# Patient Record
Sex: Female | Born: 1970 | Race: White | Hispanic: No | Smoking: Former smoker
Health system: Southern US, Community
[De-identification: ages and names within clinical notes are randomized; demographics above are authoritative.]

## PROBLEM LIST (undated history)

## (undated) ENCOUNTER — Inpatient Hospital Stay (HOSPITAL_COMMUNITY): Payer: Self-pay

## (undated) DIAGNOSIS — E538 Deficiency of other specified B group vitamins: Secondary | ICD-10-CM

## (undated) DIAGNOSIS — E559 Vitamin D deficiency, unspecified: Secondary | ICD-10-CM

## (undated) DIAGNOSIS — M722 Plantar fascial fibromatosis: Secondary | ICD-10-CM

## (undated) DIAGNOSIS — Z8719 Personal history of other diseases of the digestive system: Secondary | ICD-10-CM

## (undated) DIAGNOSIS — R1310 Dysphagia, unspecified: Secondary | ICD-10-CM

## (undated) DIAGNOSIS — E739 Lactose intolerance, unspecified: Secondary | ICD-10-CM

## (undated) DIAGNOSIS — R32 Unspecified urinary incontinence: Secondary | ICD-10-CM

## (undated) DIAGNOSIS — IMO0002 Reserved for concepts with insufficient information to code with codable children: Secondary | ICD-10-CM

## (undated) DIAGNOSIS — C73 Malignant neoplasm of thyroid gland: Secondary | ICD-10-CM

## (undated) DIAGNOSIS — K259 Gastric ulcer, unspecified as acute or chronic, without hemorrhage or perforation: Secondary | ICD-10-CM

## (undated) DIAGNOSIS — F1010 Alcohol abuse, uncomplicated: Secondary | ICD-10-CM

## (undated) DIAGNOSIS — T7840XA Allergy, unspecified, initial encounter: Secondary | ICD-10-CM

## (undated) DIAGNOSIS — Z86018 Personal history of other benign neoplasm: Secondary | ICD-10-CM

## (undated) DIAGNOSIS — F32A Depression, unspecified: Secondary | ICD-10-CM

## (undated) DIAGNOSIS — E669 Obesity, unspecified: Secondary | ICD-10-CM

## (undated) DIAGNOSIS — F509 Eating disorder, unspecified: Secondary | ICD-10-CM

## (undated) DIAGNOSIS — Z91018 Allergy to other foods: Secondary | ICD-10-CM

## (undated) DIAGNOSIS — E039 Hypothyroidism, unspecified: Secondary | ICD-10-CM

## (undated) DIAGNOSIS — K589 Irritable bowel syndrome without diarrhea: Secondary | ICD-10-CM

## (undated) DIAGNOSIS — M2550 Pain in unspecified joint: Secondary | ICD-10-CM

## (undated) DIAGNOSIS — R7303 Prediabetes: Secondary | ICD-10-CM

## (undated) DIAGNOSIS — L719 Rosacea, unspecified: Secondary | ICD-10-CM

## (undated) DIAGNOSIS — G4730 Sleep apnea, unspecified: Secondary | ICD-10-CM

## (undated) DIAGNOSIS — Z85850 Personal history of malignant neoplasm of thyroid: Secondary | ICD-10-CM

## (undated) DIAGNOSIS — Z87442 Personal history of urinary calculi: Secondary | ICD-10-CM

## (undated) DIAGNOSIS — K921 Melena: Secondary | ICD-10-CM

## (undated) DIAGNOSIS — B019 Varicella without complication: Secondary | ICD-10-CM

## (undated) DIAGNOSIS — D649 Anemia, unspecified: Secondary | ICD-10-CM

## (undated) DIAGNOSIS — M7989 Other specified soft tissue disorders: Secondary | ICD-10-CM

## (undated) DIAGNOSIS — K5090 Crohn's disease, unspecified, without complications: Secondary | ICD-10-CM

## (undated) DIAGNOSIS — K5900 Constipation, unspecified: Secondary | ICD-10-CM

## (undated) DIAGNOSIS — K219 Gastro-esophageal reflux disease without esophagitis: Secondary | ICD-10-CM

## (undated) DIAGNOSIS — Z3490 Encounter for supervision of normal pregnancy, unspecified, unspecified trimester: Secondary | ICD-10-CM

## (undated) DIAGNOSIS — J45909 Unspecified asthma, uncomplicated: Secondary | ICD-10-CM

## (undated) DIAGNOSIS — N816 Rectocele: Secondary | ICD-10-CM

## (undated) DIAGNOSIS — N200 Calculus of kidney: Secondary | ICD-10-CM

## (undated) DIAGNOSIS — D509 Iron deficiency anemia, unspecified: Secondary | ICD-10-CM

## (undated) DIAGNOSIS — R011 Cardiac murmur, unspecified: Secondary | ICD-10-CM

## (undated) DIAGNOSIS — E0789 Other specified disorders of thyroid: Secondary | ICD-10-CM

## (undated) HISTORY — DX: Iron deficiency anemia, unspecified: D50.9

## (undated) HISTORY — DX: Crohn's disease, unspecified, without complications: K50.90

## (undated) HISTORY — DX: Dysphagia, unspecified: R13.10

## (undated) HISTORY — DX: Reserved for concepts with insufficient information to code with codable children: IMO0002

## (undated) HISTORY — PX: OTHER SURGICAL HISTORY: SHX169

## (undated) HISTORY — DX: Other specified disorders of thyroid: E07.89

## (undated) HISTORY — DX: Obesity, unspecified: E66.9

## (undated) HISTORY — DX: Sleep apnea, unspecified: G47.30

## (undated) HISTORY — DX: Unspecified asthma, uncomplicated: J45.909

## (undated) HISTORY — DX: Varicella without complication: B01.9

## (undated) HISTORY — DX: Melena: K92.1

## (undated) HISTORY — DX: Rosacea, unspecified: L71.9

## (undated) HISTORY — DX: Plantar fascial fibromatosis: M72.2

## (undated) HISTORY — DX: Lactose intolerance, unspecified: E73.9

## (undated) HISTORY — PX: HERNIA REPAIR: SHX51

## (undated) HISTORY — DX: Allergy to other foods: Z91.018

## (undated) HISTORY — DX: Malignant neoplasm of thyroid gland: C73

## (undated) HISTORY — DX: Other specified soft tissue disorders: M79.89

## (undated) HISTORY — DX: Vitamin D deficiency, unspecified: E55.9

## (undated) HISTORY — DX: Allergy, unspecified, initial encounter: T78.40XA

## (undated) HISTORY — DX: Unspecified urinary incontinence: R32

## (undated) HISTORY — DX: Constipation, unspecified: K59.00

## (undated) HISTORY — DX: Depression, unspecified: F32.A

## (undated) HISTORY — DX: Eating disorder, unspecified: F50.9

## (undated) HISTORY — DX: Irritable bowel syndrome, unspecified: K58.9

## (undated) HISTORY — DX: Anemia, unspecified: D64.9

## (undated) HISTORY — DX: Cardiac murmur, unspecified: R01.1

## (undated) HISTORY — DX: Gastro-esophageal reflux disease without esophagitis: K21.9

## (undated) HISTORY — DX: Alcohol abuse, uncomplicated: F10.10

## (undated) HISTORY — DX: Personal history of other benign neoplasm: Z86.018

## (undated) HISTORY — DX: Pain in unspecified joint: M25.50

## (undated) HISTORY — DX: Gastric ulcer, unspecified as acute or chronic, without hemorrhage or perforation: K25.9

## (undated) HISTORY — DX: Deficiency of other specified B group vitamins: E53.8

## (undated) HISTORY — DX: Rectocele: N81.6

## (undated) HISTORY — DX: Calculus of kidney: N20.0

## (undated) HISTORY — DX: Personal history of malignant neoplasm of thyroid: Z85.850

---

## 2005-05-10 DIAGNOSIS — C73 Malignant neoplasm of thyroid gland: Secondary | ICD-10-CM

## 2005-05-10 HISTORY — DX: Malignant neoplasm of thyroid gland: C73

## 2005-05-10 HISTORY — PX: THYROIDECTOMY: SHX17

## 2008-03-26 DIAGNOSIS — D239 Other benign neoplasm of skin, unspecified: Secondary | ICD-10-CM

## 2008-03-26 HISTORY — DX: Other benign neoplasm of skin, unspecified: D23.9

## 2009-02-19 ENCOUNTER — Ambulatory Visit: Payer: Self-pay | Admitting: Gastroenterology

## 2009-02-19 HISTORY — PX: ESOPHAGOGASTRODUODENOSCOPY: SHX1529

## 2010-02-03 DIAGNOSIS — L818 Other specified disorders of pigmentation: Secondary | ICD-10-CM

## 2010-02-03 HISTORY — DX: Other specified disorders of pigmentation: L81.8

## 2011-05-11 LAB — HM PAP SMEAR

## 2011-10-07 ENCOUNTER — Ambulatory Visit (INDEPENDENT_AMBULATORY_CARE_PROVIDER_SITE_OTHER): Payer: Medicaid Other | Admitting: Family Medicine

## 2011-10-07 ENCOUNTER — Encounter: Payer: Self-pay | Admitting: Family Medicine

## 2011-10-07 VITALS — BP 122/68 | HR 74 | Temp 98.0°F | Ht 64.0 in | Wt 184.0 lb

## 2011-10-07 DIAGNOSIS — E039 Hypothyroidism, unspecified: Secondary | ICD-10-CM

## 2011-10-07 DIAGNOSIS — L989 Disorder of the skin and subcutaneous tissue, unspecified: Secondary | ICD-10-CM

## 2011-10-07 DIAGNOSIS — M549 Dorsalgia, unspecified: Secondary | ICD-10-CM

## 2011-10-07 DIAGNOSIS — IMO0002 Reserved for concepts with insufficient information to code with codable children: Secondary | ICD-10-CM

## 2011-10-07 DIAGNOSIS — J4520 Mild intermittent asthma, uncomplicated: Secondary | ICD-10-CM

## 2011-10-07 DIAGNOSIS — J45909 Unspecified asthma, uncomplicated: Secondary | ICD-10-CM

## 2011-10-07 MED ORDER — ALBUTEROL SULFATE HFA 108 (90 BASE) MCG/ACT IN AERS: 2.0000 | INHALATION_SPRAY | RESPIRATORY_TRACT | Status: DC | PRN

## 2011-10-07 MED ORDER — FLUTICASONE-SALMETEROL 250-50 MCG/DOSE IN AEPB: 1.0000 | INHALATION_SPRAY | Freq: Two times a day (BID) | RESPIRATORY_TRACT | Status: DC

## 2011-10-07 NOTE — Patient Instructions (Signed)
It was great to see you today! We will get someone in touch with you about seeing a geneticist.

## 2011-10-11 ENCOUNTER — Telehealth: Payer: Self-pay | Admitting: *Deleted

## 2011-10-11 NOTE — Telephone Encounter (Signed)
[facility] calling because patient had a visit on 09/09/11 for acne.  [facility] listed as PCP on Medicaid card and insurance declined visit. Due to patient having Medicaid, office calling to request NPI #  to authorize appt.  NPI # given.  Gaylene Brooks, RN

## 2011-10-21 ENCOUNTER — Encounter: Payer: Self-pay | Admitting: Family Medicine

## 2011-10-21 DIAGNOSIS — E039 Hypothyroidism, unspecified: Secondary | ICD-10-CM | POA: Insufficient documentation

## 2011-10-21 DIAGNOSIS — J4520 Mild intermittent asthma, uncomplicated: Secondary | ICD-10-CM | POA: Insufficient documentation

## 2011-10-21 NOTE — Assessment & Plan Note (Signed)
Managed by functional medicine doc.  Will give referral for this.

## 2011-10-21 NOTE — Assessment & Plan Note (Signed)
Will give rx for advair for use during summer.  Are using the med as she has been on it before to good effect.

## 2011-10-21 NOTE — Progress Notes (Signed)
Patient ID: Linda Ware, female   DOB: [birth year], 41 y.o.   MRN: [id number] Subjective: The patient is a 41 y.o. year old female who presents today for new patient appointment.  1. Thyroid: Patient developed papillary carcinoma of the thyroid in 2007.  Had surgery.  Is being managed by functional medicine physician on armor thyroid.  No current hyper/hypo symptoms.  Weight is down somewhat over the last month or two.  Otherwise doing well.  No evidence of recurrence of cancer.  2. Asthma: Uses albuterol PRN.  Has been on advair in the past.  Currently using albuterol 4-5 times daily.  Does have problems with cough and wheezing.  No night time awakenings.  No hospitalizations.  Not on any long-term meds or ICS currently.  3. Genetics: Has Askinazi jewish heritage.  Has multiple questions about genetic testing related to this.  No history of failed genetic screen in children.  4. Back pain: Intermittent.  Sees chyropacter for this  Patient's past medical, social, and family history were reviewed and updated as appropriate. History  Substance Use Topics  . Smoking status: Never Smoker   . Smokeless tobacco: Not on file  . Alcohol Use: Not on file   Objective:  Filed Vitals:   10/07/11 1550  BP: 122/68  Pulse: 74  Temp: 98 F (36.7 C)   Gen: NAD, overweight HEENT: MMM, EOMI, PERRL, TM normal bilaterally, no adenopathy CV: RRR, faint systolic murmur Resp: Occasional wheezes. Back: No pain on palpation, no deformities Ext: 2+ pulses, no edema  Assessment/Plan:  Please also see individual problems in problem list for problem-specific plans.

## 2011-12-07 ENCOUNTER — Encounter: Payer: Self-pay | Admitting: Family Medicine

## 2011-12-07 ENCOUNTER — Ambulatory Visit (INDEPENDENT_AMBULATORY_CARE_PROVIDER_SITE_OTHER): Payer: Medicaid Other | Admitting: Family Medicine

## 2011-12-07 VITALS — BP 108/54 | HR 56 | Temp 97.3°F | Ht 64.0 in | Wt 187.1 lb

## 2011-12-07 DIAGNOSIS — M75101 Unspecified rotator cuff tear or rupture of right shoulder, not specified as traumatic: Secondary | ICD-10-CM

## 2011-12-07 DIAGNOSIS — M722 Plantar fascial fibromatosis: Secondary | ICD-10-CM

## 2011-12-07 DIAGNOSIS — M719 Bursopathy, unspecified: Secondary | ICD-10-CM

## 2011-12-07 DIAGNOSIS — M67919 Unspecified disorder of synovium and tendon, unspecified shoulder: Secondary | ICD-10-CM

## 2011-12-07 NOTE — Patient Instructions (Signed)
I want you to make an appointment with Dr. Darrick Penna at our sports medicine clinic on your way out. Until you see him, I want you to continue using Motrin three times per day, doing the stretch I showed you, and bathing your foot in ice for 15 min 2-3 times per day. Make sure to bring your running shoes and any other shoes you wear on a regular basis in with you to your appointment there.

## 2011-12-08 ENCOUNTER — Encounter: Payer: Self-pay | Admitting: Sports Medicine

## 2011-12-08 ENCOUNTER — Ambulatory Visit (INDEPENDENT_AMBULATORY_CARE_PROVIDER_SITE_OTHER): Payer: Medicaid Other | Admitting: Sports Medicine

## 2011-12-08 VITALS — BP 120/54 | Ht 64.0 in | Wt 185.0 lb

## 2011-12-08 DIAGNOSIS — M722 Plantar fascial fibromatosis: Secondary | ICD-10-CM

## 2011-12-08 DIAGNOSIS — IMO0002 Reserved for concepts with insufficient information to code with codable children: Secondary | ICD-10-CM

## 2011-12-08 DIAGNOSIS — S46919A Strain of unspecified muscle, fascia and tendon at shoulder and upper arm level, unspecified arm, initial encounter: Secondary | ICD-10-CM

## 2011-12-08 NOTE — Patient Instructions (Addendum)
Try out your new inserts Do your plantar fascial stretches and step exercises Submerge your heel in ice water for 10 minutes every day Be sure to do your shoulder exercises Followup in 4 weeks. We may make you a new pair of custom orthotics at that time

## 2011-12-08 NOTE — Progress Notes (Signed)
  Subjective:    Patient ID: Linda Ware, female    DOB: 10-23-1970, 41 y.o.   MRN: [id number]  HPI Pt comes today for evaluation of left foot pain and left shoulder pain.  Left foot pain: Hx of plantar fascitis in 2004. Does not recall which foot. Had treatment with steroid injection, physical therapy and custom orthotics. Was at the point to get surgery but pregnancy delayed procedure. After pregancy pain resolved completely. She started to train since January/2013 for a Triathlon and two months ago started to feel pain after exercise on the heal and bottom of left foot. Has tried ice, stretching exercises, foot support during sleep with no relieve of symptoms. Pain is similar to what she had previously. No trauma. No rest pain. She's tried night splints which are ineffective.  Shoulder:  Concomitant  with her foot pain, she has noticed pain on left shoulder for about same time (2 months). As part of her training she has boot camp in which she has to lift weights and has several exercises with shoulder abduction and extension above 90 degrees. The best way pt describes it is as a "ghost pain"  localized on the anterior side of shoulder with no radiation. Pain mild to moderated, does not wake her up at night.   No erythema, swelling on the affected joints.  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 Well-developed, well-nourished. No acute distress. MSK: Left shoulder: normal appearance, no erythema or edema, normal ROM passive and active . Mild tenderness on the area of anterior insertion of biceps muscle. Normal strength. Negative empty can, negative Hawkins. Negative O'Brien. She is neurovascularly intact  distally.           Left foot: Foot with decreased arch and spread 1-2 toes. No erythema or effusion. Normal ROM of ankle joint. Tenderness to palpation on medial/mid aspect of calcaneous bone. No pain to squeeze test or hop test. Significant pronation with running. No noticeable limp.   Ultrasound as performed showing hypo echogenic area on the left plantar fascia insertion. Plantar fascia measures 0.46 cm. Right plantar fascia measures 0.48cm. no evidence of neovascularity. Calcaneus appears to be within normal limits.    Assessment & Plan:  1. Left foot pain secondary to plantar fasciitis 2. Intermittent left shoulder pain likely secondary to proximal biceps tendon strain   We will try a pair of green sports insoles with navicular pads and will have the patient return in 4 weeks. We will plan on making her custom orthotics at that time. She has had custom orthotics in the past and she brought those with her today. They have a rather hard base and are not ideal for plantar fasciitis. She will start plantar fascial stretches and eccentric strengthening exercises. Daily ice. I recommended 2 weeks of crosstraining away from running after which time she can resume running as tolerated. She is currently training for a Sprint triathlon in October.  For her left shoulder issues, I've given her a set of simple Jobe exercises. I don't think her shoulder is symptomatic enough at this time to warrant further workup. I did caution about repetitive overhead activity with her boot camp classes.

## 2011-12-14 ENCOUNTER — Encounter: Payer: Self-pay | Admitting: Family Medicine

## 2011-12-14 DIAGNOSIS — M75101 Unspecified rotator cuff tear or rupture of right shoulder, not specified as traumatic: Secondary | ICD-10-CM | POA: Insufficient documentation

## 2011-12-14 DIAGNOSIS — M722 Plantar fascial fibromatosis: Secondary | ICD-10-CM | POA: Insufficient documentation

## 2011-12-14 NOTE — Assessment & Plan Note (Signed)
That his symptoms are relatively mild. She mentioned this to me at the very end of her visit. I do not feel that he really much in the way of workup will be required but will ask her to talk to doctors at sports medicine about this for and exercise handout.

## 2011-12-14 NOTE — Assessment & Plan Note (Signed)
The patient initial exercises for plantar fasciitis. I will also refer her to sports medicine as her previous symptoms were relatively severe and I do not want her to end up in the situation again.

## 2011-12-14 NOTE — Progress Notes (Signed)
Patient ID: Linda Ware, female   DOB: [birth year], 41 y.o.   MRN: [id number] Subjective: The patient is a 41 y.o. year old female who presents today for foot pain.  1. Foot pain: Patient has previously been diagnosed with plantar fasciitis. This diagnosis was many years ago. She received multiple treatments with also the point of surgery. This disappeared following pregnancy. Recently she started training for a triathlon and has begun having a recurrence of her symptoms. She reports that the pain is located in her foot at the level of the heel. She has some discomfort in her Achilles tendon as well. Pain is made worse by long periods of standing or running. She has tried intermittent ice and Motrin neither of which provided significant relief.  2. Shoulder discomfort: Patient also reports that for the last month or so she's been having some problems with right shoulder pain. She is not really limited in mobility but notices that when she moves her arm in certain ways it causes discomfort.  Patient's past medical, social, and family history were reviewed and updated as appropriate. History  Substance Use Topics  . Smoking status: Never Smoker   . Smokeless tobacco: Not on file  . Alcohol Use: Not on file   Objective:  Filed Vitals:   12/07/11 0855  BP: 108/54  Pulse: 56  Temp: 97.3 F (36.3 C)   Gen: NAD, well developed Ext: Pain at insertion of the plantar fascia to the calcaneous on the left foot.  There is both longitudinal and transverse arch collapse.  Gait is normal.  No pain on palpation of achiles tendon.  No swelling of either foot.  Shoulder has full ROM with no pain on palpation or swelling.  Mildly positive empty-can sign, otherwise negative exam.  Assessment/Plan:  Please also see individual problems in problem list for problem-specific plans.

## 2011-12-24 ENCOUNTER — Telehealth: Payer: Self-pay | Admitting: *Deleted

## 2011-12-24 NOTE — Telephone Encounter (Signed)
Received call on Physicians /Pharmacy voicemail from Zonia Kief a pediatric  genetics counselor that works with Dr. Erik Obey.  She is calling about the referral she received for genetics counselling and carrier screen testing  for this patient. She needs to talk with MD about this referral. Her concern is that patient may be best serviced with a preconception clinic. Just wants to make sure she is not missing something.  Please call at [phone] next week or [phone] until 5:30 today.

## 2011-12-29 NOTE — Telephone Encounter (Signed)
Called and LVM stating that patient is more interested in what she might have that she doesn't know about rather than pre-conception but that I would be very happy to talk about this with them more.  If they call back, please come get me.

## 2012-01-05 ENCOUNTER — Ambulatory Visit (INDEPENDENT_AMBULATORY_CARE_PROVIDER_SITE_OTHER): Payer: Medicaid Other | Admitting: Sports Medicine

## 2012-01-05 ENCOUNTER — Encounter: Payer: Self-pay | Admitting: Sports Medicine

## 2012-01-05 VITALS — BP 116/75 | HR 59 | Ht 64.0 in | Wt 185.0 lb

## 2012-01-05 DIAGNOSIS — M216X9 Other acquired deformities of unspecified foot: Secondary | ICD-10-CM

## 2012-01-05 DIAGNOSIS — M722 Plantar fascial fibromatosis: Secondary | ICD-10-CM

## 2012-01-05 NOTE — Progress Notes (Signed)
  Subjective:    Patient ID: Linda Ware, female    DOB: 1970-12-04, 41 y.o.   MRN: [id number]  HPI Patient comes in today for orthotics. Her heel pain has improved but not resolved. She's been unable to run due to bronchitis, but she still training for her triathlon. She has been doing her home exercises and daily ice baths. Temporary orthotics do seem to be helpful. Please see the last note for further history and exam findings.    Review of Systems     Objective:   Physical Exam        Assessment & Plan:  1. Plantar fasciitis with pronation  Patient was fitted for a : standard, cushioned, semi-rigid orthotic. The orthotic was heated and afterward the patient stood on the orthotic blank positioned on the orthotic stand. The patient was positioned in subtalar neutral position and 10 degrees of ankle dorsiflexion in a weight bearing stance. After completion of molding, a stable base was applied to the orthotic blank. The blank was ground to a stable position for weight bearing. Size:9 Base: [employer] and Padding: B/L first ray posts  Patient was comfortable afterwards. F/U in 4 weeks for adjustments if needed.

## 2012-02-02 ENCOUNTER — Ambulatory Visit: Payer: Medicaid Other | Admitting: Sports Medicine

## 2012-02-03 ENCOUNTER — Telehealth: Payer: Self-pay | Admitting: Family Medicine

## 2012-02-03 NOTE — Telephone Encounter (Signed)
Has gone back and forth with patient to determine what would be best for the testing for genetics  Best place to start is with [facility] at [facility] [phone]-  this has been communicated to patient They don't think they need to start with the children  The best way to contact her is: [email or web address]

## 2012-03-15 ENCOUNTER — Telehealth: Payer: Self-pay | Admitting: Family Medicine

## 2012-03-15 NOTE — Telephone Encounter (Signed)
Could you all please call patient and see if she can be scheduled to see me next Thursday morning at 10 or 10:30 to see me about weight management?  If she can, please schedule for a appointment as she will be seeing me and Dr. Gerilyn Pilgrim at the same time.  Thanks!

## 2012-03-16 NOTE — Telephone Encounter (Signed)
Patient states that she will call us back and schedule an appointment

## 2012-04-14 ENCOUNTER — Ambulatory Visit (INDEPENDENT_AMBULATORY_CARE_PROVIDER_SITE_OTHER): Payer: Medicaid Other | Admitting: Family Medicine

## 2012-04-14 ENCOUNTER — Encounter: Payer: Self-pay | Admitting: Family Medicine

## 2012-04-14 DIAGNOSIS — I872 Venous insufficiency (chronic) (peripheral): Secondary | ICD-10-CM

## 2012-04-14 DIAGNOSIS — E669 Obesity, unspecified: Secondary | ICD-10-CM

## 2012-04-14 NOTE — Progress Notes (Signed)
Patient ID: Linda Ware, female   DOB: [birth year], 41 y.o.   MRN: [id number] Subjective: The patient is a 41 y.o. year old female who presents today for problems with weight and leg swelling.  Patient lost 100 lbs in 2010 using homeopathic HCG. For about the last year she has been struggling significantly with her weight.  Patient reports that she has several times per month with night time binge eating.  She also reports that there is some link between this and problems with her husband.  B (6-7 AM)- Dry rice chex, apple  Snk (11 AM)- 1/2 of a cheese cake (4 servings)  L ( PM)- Piece of pizza  Snk (1 PM)- Peppermint Mocha, pop tart (2 of them), some food samples (2-3) at grocery store  D (5:30 PM)- Salad with blue cheese dressing (1tbsp), 2 pieces of pizza, 2 oz bacon wrapped tenderloin, sparking cider (4-5 servings of 8oz) Snk (1 AM)-  1 serving browny, can of chicken noodle soup This is a typical "bad" day for her.  Patient reports that she is trying to cut out gluten, dairy, and nuts from her diet as she has felt that this has been at least somewhat helpful for her to feel somewhat more energetic.   Patient also reports that she is working two jobs at this time and is having a large amount of problems with leg aching and swelling when she has been on her feet for extended periods of time.  Her current jobs require her to be on her feet for 4-8 hours at a time.  Discomfort is made better by elevation.  Patient's past medical, social, and family history were reviewed and updated as appropriate. History  Substance Use Topics  . Smoking status: Never Smoker   . Smokeless tobacco: Not on file  . Alcohol Use: Not on file   Objective:  There were no vitals filed for this visit. Gen: NAD, overweight Ext: No edema currently  Assessment/Plan:  Please also see individual problems in problem list for problem-specific plans.

## 2012-04-14 NOTE — Patient Instructions (Signed)
3 major goals for the next several weeks: 1. Large helping of vegetables with lunch and dinner. 2. At least 1 serving of protein with breakfast and 2 with lunch and dinner. 3. Try to reduce the number of servings of refined carbohydrates to 1 serving per day and make certain that you are not doing this as a snack  Continue with your plan of exercising once per week.

## 2012-04-24 DIAGNOSIS — E669 Obesity, unspecified: Secondary | ICD-10-CM | POA: Insufficient documentation

## 2012-04-24 DIAGNOSIS — I872 Venous insufficiency (chronic) (peripheral): Secondary | ICD-10-CM | POA: Insufficient documentation

## 2012-04-24 HISTORY — DX: Obesity, unspecified: E66.9

## 2012-04-24 NOTE — Assessment & Plan Note (Signed)
See patient instructions for action plan.  Pt to return in about 1 month for f/u.

## 2012-04-24 NOTE — Assessment & Plan Note (Signed)
Provide rx for compression stockings to be worn when on feet for considerable time.  F/u prn.

## 2012-05-10 NOTE — L&D Delivery Note (Signed)
Delivery Note  Cervix complete at 1736, Pt pushed over about 1 ctx, FHR overall reassuring w mild variables.  At 5:46 PM a viable female was delivered via Vaginal, Spontaneous Delivery (Presentation: OA ;  ).  Shoulders delivered easily APGAR: 9, 9; weight: pending  Placenta status: Intact, Spontaneous.  Cord: 3 vessels with the following complications: None.  Cord pH: n/a   Anesthesia: Epidural  Episiotomy: None Lacerations: 1st degree Suture Repair: 3.0 vicryl rapide Est. Blood Loss (mL): 300  Mom to postpartum.  Baby to Couplet care / Skin to Skin. Pt plans to BF Desires outpatient circumcision Routine PP orders initiated   Suzette Flagler M 04/13/2013, 6:32 PM

## 2012-05-16 ENCOUNTER — Ambulatory Visit (INDEPENDENT_AMBULATORY_CARE_PROVIDER_SITE_OTHER): Payer: Self-pay | Admitting: Family Medicine

## 2012-05-16 DIAGNOSIS — Z713 Dietary counseling and surveillance: Secondary | ICD-10-CM

## 2012-05-17 ENCOUNTER — Encounter: Payer: Self-pay | Admitting: Family Medicine

## 2012-05-17 ENCOUNTER — Ambulatory Visit (INDEPENDENT_AMBULATORY_CARE_PROVIDER_SITE_OTHER): Payer: Medicaid Other | Admitting: Family Medicine

## 2012-05-17 VITALS — BP 135/81 | HR 67 | Ht 64.0 in | Wt 208.0 lb

## 2012-05-17 DIAGNOSIS — E039 Hypothyroidism, unspecified: Secondary | ICD-10-CM

## 2012-05-17 DIAGNOSIS — IMO0002 Reserved for concepts with insufficient information to code with codable children: Secondary | ICD-10-CM

## 2012-05-17 DIAGNOSIS — Z86018 Personal history of other benign neoplasm: Secondary | ICD-10-CM | POA: Insufficient documentation

## 2012-05-17 DIAGNOSIS — L719 Rosacea, unspecified: Secondary | ICD-10-CM

## 2012-05-17 DIAGNOSIS — Z872 Personal history of diseases of the skin and subcutaneous tissue: Secondary | ICD-10-CM

## 2012-05-17 HISTORY — DX: Personal history of other benign neoplasm: Z86.018

## 2012-05-22 DIAGNOSIS — L719 Rosacea, unspecified: Secondary | ICD-10-CM | POA: Insufficient documentation

## 2012-05-22 NOTE — Assessment & Plan Note (Signed)
Explained to patient that, without recent changes, she does not need to continue seeing derm for this problems.  I will continue her skin cream rx.

## 2012-05-22 NOTE — Assessment & Plan Note (Signed)
Will refer to functional medicine per patient request as I do not generally use this medication.

## 2012-05-22 NOTE — Progress Notes (Signed)
Patient ID: Linda Ware, female   DOB: August 23, 1970, 42 y.o.   MRN: [id number] Subjective: The patient is a 42 y.o. year old female who presents today for referrals.  1. Functional medicine: Patient wants referral for management of obesity and hypothyroid.  She is interested in dietary modifications for obesity and use of Armor Thyroid for her thyroid disorder.  2. Dermatology: Patient has mild rosacea on Azelaic Acid daily.  Symptoms well controlled with no recent changes in management.  Has been seeing dermatology every 3-4 months for this.  Patient also had a dysplastic nevus a number of years ago and gets yearly total body skin exams.  3. Genetics: Jewish heritage.  Would like to be evaluated for any genetic disorders that may affect her or her children.  Has previously been referred to [facility] genetics and is still in process of setting up appointment.  She want to make sure she can still go.  4. Chiropractor: Patient goes to chiropractor for back adjustments about every 1-2 months.  Patient's past medical, social, and family history were reviewed and updated as appropriate. History  Substance Use Topics  . Smoking status: Never Smoker   . Smokeless tobacco: Not on file  . Alcohol Use: Not on file   Objective:  Filed Vitals:   05/17/12 1558  BP: 135/81  Pulse: 67   No exam performed today  Assessment/Plan:  Please also see individual problems in problem list for problem-specific plans.

## 2012-05-22 NOTE — Assessment & Plan Note (Signed)
Referral is authorized and will be good for 1 year from today.

## 2012-06-03 ENCOUNTER — Emergency Department (HOSPITAL_COMMUNITY)
Admission: EM | Admit: 2012-06-03 | Discharge: 2012-06-03 | Disposition: A | Discharge status: Home / Self Care | Payer: Medicaid Other | Attending: Emergency Medicine | Admitting: Emergency Medicine

## 2012-06-03 ENCOUNTER — Encounter (HOSPITAL_COMMUNITY): Payer: Self-pay | Admitting: Emergency Medicine

## 2012-06-03 DIAGNOSIS — Z87442 Personal history of urinary calculi: Secondary | ICD-10-CM | POA: Insufficient documentation

## 2012-06-03 DIAGNOSIS — Z9889 Other specified postprocedural states: Secondary | ICD-10-CM | POA: Insufficient documentation

## 2012-06-03 DIAGNOSIS — Z79899 Other long term (current) drug therapy: Secondary | ICD-10-CM | POA: Insufficient documentation

## 2012-06-03 DIAGNOSIS — K209 Esophagitis, unspecified without bleeding: Secondary | ICD-10-CM | POA: Insufficient documentation

## 2012-06-03 DIAGNOSIS — E0789 Other specified disorders of thyroid: Secondary | ICD-10-CM | POA: Insufficient documentation

## 2012-06-03 DIAGNOSIS — Z85850 Personal history of malignant neoplasm of thyroid: Secondary | ICD-10-CM | POA: Insufficient documentation

## 2012-06-03 DIAGNOSIS — Z862 Personal history of diseases of the blood and blood-forming organs and certain disorders involving the immune mechanism: Secondary | ICD-10-CM | POA: Insufficient documentation

## 2012-06-03 DIAGNOSIS — IMO0002 Reserved for concepts with insufficient information to code with codable children: Secondary | ICD-10-CM | POA: Insufficient documentation

## 2012-06-03 DIAGNOSIS — K219 Gastro-esophageal reflux disease without esophagitis: Secondary | ICD-10-CM | POA: Insufficient documentation

## 2012-06-03 MED ORDER — OMEPRAZOLE 20 MG PO CPDR: 20.0000 mg | DELAYED_RELEASE_CAPSULE | Freq: Every day | ORAL | Status: DC

## 2012-06-03 NOTE — ED Provider Notes (Signed)
History     CSN: [id number]  Arrival date & time 06/03/12  1139   First MD Initiated Contact with Patient 06/03/12 1212      Chief Complaint  Patient presents with  . Abdominal Pain    (Consider location/radiation/quality/duration/timing/severity/associated sxs/prior treatment) HPI Complains of epigastric pain radiating to her throat for the past 3 weeks. Pain is worse with healing or worse with lying flat. She is treated herself with calcium carbonate antacid with partial relief. She also reports using ibuprofen rather frequently recently. Headaches. She is presently asymptomatic she denies fever last normal menstrual period 05/13/2012 bowel movements have been normal. No other associated symptoms Past Medical History  Diagnosis Date  . Iron deficiency anemia   . Alcohol abuse     in college, none in years  . 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 GERD (gastroesophageal reflux disease)   . Kidney stone   . Sleep apnea     intermittent   Occasional alcohol use Past Surgical History  Procedure Date  . Thyroidectomy 2007  . Hernia repair     Family History  Problem Relation Age of Onset  . Asthma Mother   . Asthma Maternal Grandmother   . Dementia Maternal Grandmother   . Dementia Maternal Grandfather   . Melanoma Maternal Grandfather   . Breast cancer Paternal Grandmother   . Colon cancer Cousin   . Heart attack Maternal Grandfather   . Heart attack Paternal Uncle   . Diabetes type II Maternal Grandfather   . Diabetes type II Paternal Aunt   . Diabetes type II Cousin   . Diabetes type II Paternal Uncle   . Hyperlipidemia Mother   . Hyperlipidemia Paternal Grandmother   . Hyperlipidemia Paternal Grandfather   . Hypertension Maternal Grandmother   . Hypertension Paternal Grandfather   . Hypertension Paternal Uncle   . Hypertension Paternal Aunt   . Kidney disease    . Stroke Maternal Uncle   . Thyroid disease Father     hyperthyroid  . Thyroid disease  Paternal Grandmother     hypothyroid  . Thyroid disease Paternal Grandfather     hypothyroid    History  Substance Use Topics  . Smoking status: Never Smoker   . Smokeless tobacco: Not on file  . Alcohol Use: Yes    OB History    Grav Para Term Preterm Abortions TAB SAB Ect Mult Living                  Review of Systems  Constitutional: Negative.   HENT: Negative.   Respiratory: Negative.   Cardiovascular: Negative.   Gastrointestinal: Positive for abdominal pain.  Musculoskeletal: Negative.   Skin: Negative.   Neurological: Negative.   Hematological: Negative.   Psychiatric/Behavioral: Negative.   All other systems reviewed and are negative.    Allergies  Clindamycin/lincomycin  Home Medications   Current Outpatient Rx  Name  Route  Sig  Dispense  Refill  . ALBUTEROL SULFATE HFA 108 (90 BASE) MCG/ACT IN AERS   Inhalation   Inhale 2 puffs into the lungs every 4 (four) hours as needed for wheezing or shortness of breath.   3.7 g   1   . ARMOUR THYROID 60 MG PO TABS   Oral   Take 60 mg by mouth daily.          . AZELAIC ACID 15 % EX GEL   Topical   Apply 1 application topically 2 (two) times  daily. After skin is thoroughly washed and patted dry, gently but thoroughly massage a thin film of azelaic acid cream into the affected area twice daily, in the morning and evening.         . IBUPROFEN 800 MG PO TABS   Oral   Take 800 mg by mouth every 8 (eight) hours as needed. For headaches         . PRESCRIPTION MEDICATION   Inhalation   Inhale 1 puff into the lungs 2 (two) times daily. On Study Drug for asthma         . FLUTICASONE-SALMETEROL 250-50 MCG/DOSE IN AEPB   Inhalation   Inhale 1 puff into the lungs 2 (two) times daily.   1 each   3     BP 130/80  Pulse 68  Temp 97.9 F (36.6 C) (Oral)  Resp 18  SpO2 99%  Physical Exam  Nursing note and vitals reviewed. Constitutional: She appears well-developed and well-nourished.  HENT:    Head: Normocephalic and atraumatic.  Eyes: Conjunctivae normal are normal. Pupils are equal, round, and reactive to light.  Neck: Neck supple. No tracheal deviation present. No thyromegaly present.  Cardiovascular: Normal rate and regular rhythm.   No murmur heard. Pulmonary/Chest: Effort normal and breath sounds normal.  Abdominal: Soft. Bowel sounds are normal. She exhibits no distension. There is no tenderness.  Musculoskeletal: Normal range of motion. She exhibits no edema and no tenderness.  Neurological: She is alert. Coordination normal.  Skin: Skin is warm and dry. No rash noted.  Psychiatric: She has a normal mood and affect.    ED Course  Procedures (including critical care time)  Labs Reviewed - No data to display No results found.   No diagnosis found.    MDM   Suspect reflux esophagitis Other considerations could topical disease, less likely Plan discontinue ibuprofen and NSAIDs Continue antacids as directed, Prilosec OTC, sleep on 2 pillows, GI referral Diagnosis abdominal pain        Doug Sou, MD 06/03/12 1223

## 2012-06-03 NOTE — ED Notes (Signed)
Denies any pain at this time. States over last 3 weeks, burning in throat and stomach after eating. Wakes her up in the night with burning. Denies vomiting but states would like to so the pain will go away.

## 2012-06-03 NOTE — ED Notes (Signed)
Pt. Stated, I've been having pain when I eat, drink, or lay down.  I have this pain that starts in my throat and goess to my stomach.  I've tried Ibuprofen and the last couple of days I've tried something for indigestion, no better

## 2012-06-05 ENCOUNTER — Telehealth: Payer: Self-pay | Admitting: Family Medicine

## 2012-06-05 DIAGNOSIS — K209 Esophagitis, unspecified without bleeding: Secondary | ICD-10-CM

## 2012-06-05 NOTE — Telephone Encounter (Signed)
OK with referral1

## 2012-06-05 NOTE — Telephone Encounter (Signed)
Patient is calling because she went to the ER at [facility] and was Diagnosed with Esophagitis and was told that she needs to see a GI doctor so she needs a referral and she doesn't want to have to come in to be seen by Dr. Louanne Belton in order to get this referral.

## 2012-06-06 ENCOUNTER — Ambulatory Visit: Payer: Medicaid Other | Admitting: Pediatrics

## 2012-06-06 ENCOUNTER — Encounter: Payer: Self-pay | Admitting: Internal Medicine

## 2012-06-24 ENCOUNTER — Other Ambulatory Visit: Payer: Self-pay

## 2012-06-29 ENCOUNTER — Encounter: Payer: Self-pay | Admitting: Internal Medicine

## 2012-06-29 ENCOUNTER — Ambulatory Visit (INDEPENDENT_AMBULATORY_CARE_PROVIDER_SITE_OTHER): Payer: Medicaid Other | Admitting: Internal Medicine

## 2012-06-29 ENCOUNTER — Encounter: Payer: Self-pay | Admitting: Family Medicine

## 2012-06-29 VITALS — BP 100/70 | HR 60 | Ht 64.0 in | Wt 215.2 lb

## 2012-06-29 DIAGNOSIS — K219 Gastro-esophageal reflux disease without esophagitis: Secondary | ICD-10-CM

## 2012-06-29 DIAGNOSIS — R1013 Epigastric pain: Secondary | ICD-10-CM

## 2012-06-29 DIAGNOSIS — R1314 Dysphagia, pharyngoesophageal phase: Secondary | ICD-10-CM

## 2012-06-29 MED ORDER — OMEPRAZOLE 20 MG PO CPDR: 20.0000 mg | DELAYED_RELEASE_CAPSULE | Freq: Every day | ORAL | Status: DC

## 2012-06-29 NOTE — Patient Instructions (Addendum)
We have sent the following medications to your pharmacy for you to pick up at your convenience: Omeprazole ( we faxed it in)  Today you have been given handouts to read and follow on GERD and eosinophilic esophagitis.  Please follow up with [place] in 2 months.  Thank you for choosing me and [facility].  Iva Boop, M.D., [facility]

## 2012-06-29 NOTE — Progress Notes (Signed)
Subjective:    Patient ID: Linda Ware, female    DOB: 04/12/1971, 42 y.o.   MRN: [id number]  HPI This very nice married and middle-aged woman is here with her husband because of a long history of heartburn, chest pain and dysphagia. She has undergone an EGD for this at [facility] in October 2010 which showed mild gastritis on biopsy but was otherwise negative. She is still having the problems above as well as epigastric pain that radiates up into the chest and to the scapulae. She went to the ED recently because of severe throat burning and was prescribed a PPI - she took it for 2 weeks with near resolution of symptoms. She did not keep taking it because of concerns about possible side effects and interference with allergy issues. She has allergic asthma and is currently using a study drug from a [facility] study. She has never had allergy testing.   Allergies  Allergen Reactions  . Clindamycin/Lincomycin Rash   Outpatient Prescriptions Prior to Visit  Medication Sig Dispense Refill  . albuterol (PROVENTIL HFA) 108 (90 BASE) MCG/ACT inhaler Inhale 2 puffs into the lungs every 4 (four) hours as needed for wheezing or shortness of breath.  3.7 g  1  . ARMOUR THYROID 60 MG tablet Take 60 mg by mouth daily.       . Azelaic Acid 15 % cream Apply 1 application topically 2 (two) times daily. After skin is thoroughly washed and patted dry, gently but thoroughly massage a thin film of azelaic acid cream into the affected area twice daily, in the morning and evening.      . Fluticasone-Salmeterol (ADVAIR DISKUS) 250-50 MCG/DOSE AEPB Inhale 1 puff into the lungs 2 (two) times daily.  1 each  3  . PRESCRIPTION MEDICATION Inhale 1 puff into the lungs 2 (two) times daily. On Study Drug for asthma      . ibuprofen (ADVIL,MOTRIN) 800 MG tablet Take 800 mg by mouth every 8 (eight) hours as needed. For headaches      . omeprazole (PRILOSEC) 20 MG capsule Take 1 capsule (20 mg total) by mouth daily.  14 capsule  0   No  facility-administered medications prior to visit.   Past Medical History  Diagnosis Date  . Iron deficiency anemia   . Alcohol abuse     in college, none in years  . 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 GERD (gastroesophageal reflux disease)   . Kidney stone   . Sleep apnea     intermittent  . Obesity   . Asthma, allergic    Past Surgical History  Procedure Laterality Date  . Thyroidectomy  2007  . Hernia repair    . Esophagogastroduodenoscopy  02/19/2009    Dr. Lurline Del  . Kidney stone removal      x multiple occasions   History   Social History  . Marital Status: Married    Spouse Name: N/A    Number of Children: N/A  . Years of Education: N/A   Social History Main Topics  . Smoking status: Former Games developer  . Smokeless tobacco: Never Used  . Alcohol Use: Yes     Comment: occasional  . Drug Use: No  . Sexually Active: None   Other Topics Concern  . None   Social History Narrative  . None   Family History  Problem Relation Age of Onset  . Asthma Mother   . Asthma Maternal Grandmother   . Dementia Maternal  Grandmother   . Dementia Maternal Grandfather   . Melanoma Maternal Grandfather   . Breast cancer Paternal Grandmother   . Colon cancer Cousin   . Heart attack Maternal Grandfather   . Heart attack Paternal Uncle   . Diabetes type II Maternal Grandfather   . Diabetes type II Paternal Aunt   . Diabetes type II Cousin   . Diabetes type II Paternal Uncle   . Hyperlipidemia Mother   . Hyperlipidemia Paternal Grandmother   . Hyperlipidemia Paternal Grandfather   . Hypertension Maternal Grandmother   . Hypertension Paternal Grandfather   . Hypertension Paternal Uncle   . Hypertension Paternal Aunt   . Kidney disease    . Stroke Maternal Uncle   . Hyperthyroidism Father   . Hypothyroidism Paternal Grandmother   . Hypothyroidism Paternal Grandfather   . Celiac disease Sister     questionable  . Multiple myeloma Maternal Grandmother    . Pancreatic cancer Paternal Grandfather   . Inflammatory bowel disease Paternal Grandmother     Review of Systems URI sxs intermittently x months with nasal congestion and drainage. All other ROS negative except as above.    Objective:   Physical Exam General:  Well-developed, well-nourished and in no acute distress Eyes:  anicteric. ENT:   Mouth and posterior pharynx free of lesions.  Neck:   supple w/o thyromegaly or mass.  Lungs: Clear to auscultation bilaterally. Heart:  S1S2, no rubs, murmurs, gallops. Abdomen:  soft, non-tender, no hepatosplenomegaly, hernia, or mass and BS+.  Lymph:  no cervical or supraclavicular adenopathy. Extremities:   no edema Skin   no rash. Neuro:  A&O x 3.  Psych:  appropriate mood and  Affect.   Data Reviewed: 09/2011 labs ED note from 05/2012      Assessment & Plan:  Dysphagia, pharyngoesophageal phase - Plan: omeprazole (PRILOSEC) 20 MG capsule  Esophageal reflux - Plan: omeprazole (PRILOSEC) 20 MG capsule  Abdominal pain, epigastric - Plan: omeprazole (PRILOSEC) 20 MG capsule  Main differential is GERD and/or eosinophilic esophagitis. We discussed this and how she is at increased risk of EE given her allergic asthma. Reasonable to go with an EGD and possible dilation now or PPI x 2 months first since she says all or nearly all sxs resolved on PPI before. I have explained that she sounds like a patient who would benefit greatly from PPI and the mostly postulated risks of long-term PPI are less of an issue. GERD and EE handouts provided. It sounds like she could benefit from allergy evaluation also - will defer to PCP for now. I did discuss but not recommend a six food elimination diet since we not have a firm dx of EE.  She will follow-up in 2 months  I appreciate the opportunity to care for this patient.  ZO:[email or web address],AVWU, MD

## 2012-07-13 ENCOUNTER — Telehealth: Payer: Self-pay | Admitting: Family Medicine

## 2012-07-13 NOTE — Telephone Encounter (Signed)
Pt is asking to speak with her doctor about the message that was sent thru My Chart, she never got an answer from that - she is needing to know about how to get more supplies for her Cpap since she is having to restart it.  pls advise

## 2012-07-24 NOTE — Telephone Encounter (Signed)
LVM for patient to call back. ?

## 2012-07-24 NOTE — Telephone Encounter (Signed)
Interesting.  I thought I had replied properly through MyChart.  I guess not.  Anyway, I need a copy of her sleep study so I can give the correct orders for it to be restarted.  What is her preferred agency to get the supplies through?

## 2012-07-27 ENCOUNTER — Telehealth: Payer: Self-pay | Admitting: Family Medicine

## 2012-07-27 NOTE — Telephone Encounter (Signed)
Refaxed referral to genetics again for Linda Ware. Linda Ware states that they have not seen her yet

## 2012-07-27 NOTE — Telephone Encounter (Signed)
Dad called the genetics dept and they have not rec'd referral for his daughter.  Needs [place] to refax this again today - fax # [phone]

## 2012-07-27 NOTE — Telephone Encounter (Signed)
My understanding was that genetics was going to see mother and then decide if they need to also see children.  If they have seen Linda Ware, I have not gotten any message from them.  If something has changed in the plan, please update me.

## 2012-08-10 ENCOUNTER — Ambulatory Visit (INDEPENDENT_AMBULATORY_CARE_PROVIDER_SITE_OTHER): Payer: Medicaid Other | Admitting: Family Medicine

## 2012-08-10 ENCOUNTER — Encounter: Payer: Self-pay | Admitting: Internal Medicine

## 2012-08-10 ENCOUNTER — Encounter: Payer: Self-pay | Admitting: Family Medicine

## 2012-08-10 VITALS — BP 121/83 | HR 61 | Ht 64.0 in | Wt 216.0 lb

## 2012-08-10 DIAGNOSIS — K219 Gastro-esophageal reflux disease without esophagitis: Secondary | ICD-10-CM

## 2012-08-10 DIAGNOSIS — Z3201 Encounter for pregnancy test, result positive: Secondary | ICD-10-CM

## 2012-08-10 DIAGNOSIS — N912 Amenorrhea, unspecified: Secondary | ICD-10-CM

## 2012-08-10 DIAGNOSIS — L719 Rosacea, unspecified: Secondary | ICD-10-CM

## 2012-08-10 DIAGNOSIS — E039 Hypothyroidism, unspecified: Secondary | ICD-10-CM

## 2012-08-10 DIAGNOSIS — J45909 Unspecified asthma, uncomplicated: Secondary | ICD-10-CM

## 2012-08-10 DIAGNOSIS — J4520 Mild intermittent asthma, uncomplicated: Secondary | ICD-10-CM

## 2012-08-10 LAB — POCT URINE PREGNANCY: Preg Test, Ur: POSITIVE

## 2012-08-10 MED ORDER — BUDESONIDE 180 MCG/ACT IN AEPB: 1.0000 | INHALATION_SPRAY | Freq: Two times a day (BID) | RESPIRATORY_TRACT | Status: DC

## 2012-08-10 NOTE — Assessment & Plan Note (Signed)
Advised would discontinue use through first trimester.  Will consult with prenatal provider regarding risk vs benefit if symptoms flare up

## 2012-08-10 NOTE — Assessment & Plan Note (Addendum)
Positive confirmed here in office.  At 5+0 weeks today.  Counseled patient on folic acid supplement 600 mcg per day, she prefers to take home supply of PNV.  Discussed otpions for care.  She will decide and call our office for New OB appointment to establish care if desires.  Encouraged her to bring up her newly pregnant status at her genetic counseling consultation scheduled for next month to discuss approach to genetic screening given her heritage and AMA

## 2012-08-10 NOTE — Assessment & Plan Note (Signed)
Patient states is managed by endocrinologist whom she will contact for increased monitoring and dose changes in pregnancy

## 2012-08-10 NOTE — Progress Notes (Signed)
  Subjective:    Patient ID: Linda Ware, female    DOB: 07/12/1970, 42 y.o.   MRN: [id number]  HPI 42 yo here today to confirm pregnancy- planned  42 yo with 5 children, all pregnancies without complications, healthy chidlren, vaginal deliveries.  3 positive home pregnancy tests.  Reliable, normal LMP of Feb 27 making her 5 weeks today [facility] 04/12/13.  Has not been taking Prenatal vitamins, but has ordered some she prefers and will start taking them.  Review medications, has been on study asthma drug which is either advair or flovent.  Has stopped rosacea gel, omeprazoel.  Continues armour thryoid  I have reviewed patient's  PMH, FH, and Social history and Medications as related to this visit. Has pending genetic referral as is Ashkenazi Jewish in heritage.  Review of Systems See HPI No vaginal bleeding, abd pain, vaginal discharge, nausea    Objective:   Physical Exam GEN: Alert & Oriented, No acute distress CV:  Regular Rate & Rhythm, no murmur Respiratory:  Normal work of breathing, CTAB Abd:  + BS, soft, no tenderness to palpation Ext: no pre-tibial edema        Assessment & Plan:

## 2012-08-10 NOTE — Assessment & Plan Note (Signed)
Feels she can manage GERD with tums and behavioral changes.  Will discontinue omeprazole.  Advised to discuss with prenatal provider if symptoms worsen for advise for escalated therapy

## 2012-08-10 NOTE — Patient Instructions (Addendum)
Congratulations.  Your period date of feb 27 makes you 5 weeks today with a due date of December 4th  Talk with your genetic counselor about pregnancy  Take a prenatal vitamin with 600 mcg of folic acid  If you would like to establish prenatal care with our office, please call to schedule a NEW OB appointment and labs  Will discontinue all medications for now except for Pulmicort and albuterol as needed and Armor thyroid (will talk to your endocrinologist about monitoring and dose adjustments)  Try tums and lifestyle measures to control GERD, If severe need to discuss alternative medication

## 2012-08-10 NOTE — Assessment & Plan Note (Signed)
History of very good control but with spring being a trigger season for her.  Will change study drug to budesonide at lowest dose.  Albuterol prn

## 2012-08-11 ENCOUNTER — Ambulatory Visit: Payer: Medicaid Other | Admitting: Family Medicine

## 2012-08-17 ENCOUNTER — Encounter: Payer: Self-pay | Admitting: Family Medicine

## 2012-08-17 ENCOUNTER — Telehealth: Payer: Self-pay | Admitting: Family Medicine

## 2012-08-17 DIAGNOSIS — Z331 Pregnant state, incidental: Secondary | ICD-10-CM

## 2012-08-17 NOTE — Telephone Encounter (Signed)
Patient is calling about getting a referral to [facility] because she is pregnant and she may want to go with them.

## 2012-08-17 NOTE — Telephone Encounter (Signed)
Referral is in.

## 2012-08-20 ENCOUNTER — Emergency Department (HOSPITAL_COMMUNITY)
Admission: EM | Admit: 2012-08-20 | Discharge: 2012-08-20 | Disposition: A | Discharge status: Home / Self Care | Payer: Medicaid Other | Attending: Emergency Medicine | Admitting: Emergency Medicine

## 2012-08-20 ENCOUNTER — Encounter (HOSPITAL_COMMUNITY): Payer: Self-pay | Admitting: Physical Medicine and Rehabilitation

## 2012-08-20 DIAGNOSIS — Z331 Pregnant state, incidental: Secondary | ICD-10-CM | POA: Insufficient documentation

## 2012-08-20 DIAGNOSIS — D509 Iron deficiency anemia, unspecified: Secondary | ICD-10-CM | POA: Insufficient documentation

## 2012-08-20 DIAGNOSIS — Z87442 Personal history of urinary calculi: Secondary | ICD-10-CM | POA: Insufficient documentation

## 2012-08-20 DIAGNOSIS — E669 Obesity, unspecified: Secondary | ICD-10-CM | POA: Insufficient documentation

## 2012-08-20 DIAGNOSIS — G4730 Sleep apnea, unspecified: Secondary | ICD-10-CM | POA: Insufficient documentation

## 2012-08-20 DIAGNOSIS — Z79899 Other long term (current) drug therapy: Secondary | ICD-10-CM | POA: Insufficient documentation

## 2012-08-20 DIAGNOSIS — K219 Gastro-esophageal reflux disease without esophagitis: Secondary | ICD-10-CM | POA: Insufficient documentation

## 2012-08-20 DIAGNOSIS — J45909 Unspecified asthma, uncomplicated: Secondary | ICD-10-CM | POA: Insufficient documentation

## 2012-08-20 DIAGNOSIS — H00019 Hordeolum externum unspecified eye, unspecified eyelid: Secondary | ICD-10-CM | POA: Insufficient documentation

## 2012-08-20 DIAGNOSIS — Z87891 Personal history of nicotine dependence: Secondary | ICD-10-CM | POA: Insufficient documentation

## 2012-08-20 DIAGNOSIS — IMO0002 Reserved for concepts with insufficient information to code with codable children: Secondary | ICD-10-CM | POA: Insufficient documentation

## 2012-08-20 DIAGNOSIS — H00016 Hordeolum externum left eye, unspecified eyelid: Secondary | ICD-10-CM

## 2012-08-20 HISTORY — DX: Encounter for supervision of normal pregnancy, unspecified, unspecified trimester: Z34.90

## 2012-08-20 MED ORDER — OXYCODONE-ACETAMINOPHEN 5-325 MG PO TABS: 2.0000 | ORAL_TABLET | Freq: Once | ORAL | Status: DC

## 2012-08-20 NOTE — ED Provider Notes (Signed)
History     CSN: [id number]  Arrival date & time 08/20/12  1124   First MD Initiated Contact with Patient 08/20/12 1153      Chief Complaint  Patient presents with  . Eye Pain    (Consider location/radiation/quality/duration/timing/severity/associated sxs/prior treatment) HPI Comments: Patient presents with pain of her left upper eyelid that has been present for the past 2 days.  Pain gradually worsening.  She has also noticed some erythema and swelling of the left upper eyelid.  Patient denies any vision changes.  Denies blurred vision.  No eye discharge.  No photophobia. No redness of the sclera.   No trauma to the eye.  No foreign body sensation.  No fever or chills.  No pain with EOM.  No treatment prior to arrival.    The history is provided by the patient.    Past Medical History  Diagnosis Date  . Iron deficiency anemia   . Alcohol abuse     in college, none in years  . 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 GERD (gastroesophageal reflux disease)   . Kidney stone   . Sleep apnea     intermittent  . Obesity   . Asthma, allergic   . Pregnant     Past Surgical History  Procedure Laterality Date  . Thyroidectomy  2007  . Hernia repair    . Esophagogastroduodenoscopy  02/19/2009    Dr. Lurline Del  . Kidney stone removal      x multiple occasions    Family History  Problem Relation Age of Onset  . Asthma Mother   . Asthma Maternal Grandmother   . Dementia Maternal Grandmother   . Dementia Maternal Grandfather   . Melanoma Maternal Grandfather   . Breast cancer Paternal Grandmother   . Colon cancer Cousin   . Heart attack Maternal Grandfather   . Heart attack Paternal Uncle   . Diabetes type II Maternal Grandfather   . Diabetes type II Paternal Aunt   . Diabetes type II Cousin   . Diabetes type II Paternal Uncle   . Hyperlipidemia Mother   . Hyperlipidemia Paternal Grandmother   . Hyperlipidemia Paternal Grandfather   . Hypertension Maternal  Grandmother   . Hypertension Paternal Grandfather   . Hypertension Paternal Uncle   . Hypertension Paternal Aunt   . Kidney disease    . Stroke Maternal Uncle   . Hyperthyroidism Father   . Hypothyroidism Paternal Grandmother   . Hypothyroidism Paternal Grandfather   . Celiac disease Sister     questionable  . Multiple myeloma Maternal Grandmother   . Pancreatic cancer Paternal Grandfather   . Inflammatory bowel disease Paternal Grandmother     History  Substance Use Topics  . Smoking status: Former Games developer  . Smokeless tobacco: Never Used  . Alcohol Use: Yes     Comment: occasional    OB History   Grav Para Term Preterm Abortions TAB SAB Ect Mult Living                  Review of Systems  Constitutional: Negative for fever and chills.  Eyes: Positive for pain. Negative for photophobia, discharge, redness, itching and visual disturbance.  All other systems reviewed and are negative.    Allergies  Clindamycin/lincomycin  Home Medications   Current Outpatient Rx  Name  Route  Sig  Dispense  Refill  . ARMOUR THYROID 60 MG tablet   Oral   Take 60 mg by  mouth daily.          . budesonide (PULMICORT) 180 MCG/ACT inhaler   Inhalation   Inhale 1 puff into the lungs 2 (two) times daily.   1 Inhaler   1     BP 104/66  Pulse 62  Temp(Src) 97.9 F (36.6 C) (Oral)  Resp 18  SpO2 98%  LMP 07/06/2012  Physical Exam  Nursing note and vitals reviewed. Constitutional: She appears well-developed and well-nourished. No distress.  HENT:  Head: Normocephalic and atraumatic.  Eyes: Conjunctivae and EOM are normal. Pupils are equal, round, and reactive to light. No foreign bodies found. Right eye exhibits no discharge. No foreign body present in the right eye. Left eye exhibits no discharge. No foreign body present in the left eye.  Sty of the left upper eyelid present Visual acuity 20/30 bilaterally  Cardiovascular: Normal rate, regular rhythm and normal heart  sounds.   Pulmonary/Chest: Effort normal and breath sounds normal.  Neurological: She is alert.  Skin: Skin is warm and dry. She is not diaphoretic.  Psychiatric: She has a normal mood and affect.    ED Course  Procedures (including critical care time)  Labs Reviewed - No data to display No results found.   1. Sty, left       MDM  Patient with a sty of her left upper eyelid.  No vision changes.  Conjunctiva normal.  Patient instructed to apply warm compresses to the area.        Pascal Lux [place], PA-C 08/20/12 2143

## 2012-08-20 NOTE — ED Notes (Signed)
Pt presents to department for evaluation of L eye pain. Ongoing x2 days. L eyelid noted to be red and swollen. Also states blurred vision. Pt states she is x6 weeks pregnant. Pt is alert and oriented x4. No signs of acute distress noted.

## 2012-08-22 NOTE — ED Provider Notes (Signed)
Medical screening examination/treatment/procedure(s) were performed by non-physician practitioner and as supervising physician I was immediately available for consultation/collaboration.   Gavin Pound. Oletta Lamas, MD 08/22/12 [phone]

## 2012-08-24 ENCOUNTER — Encounter: Payer: Self-pay | Admitting: Family Medicine

## 2012-08-28 ENCOUNTER — Encounter: Payer: Self-pay | Admitting: *Deleted

## 2012-08-28 ENCOUNTER — Other Ambulatory Visit: Payer: Self-pay | Admitting: Family Medicine

## 2012-08-28 MED ORDER — BUDESONIDE 180 MCG/ACT IN AEPB: 2.0000 | INHALATION_SPRAY | Freq: Two times a day (BID) | RESPIRATORY_TRACT | Status: DC

## 2012-08-29 LAB — OB RESULTS CONSOLE ANTIBODY SCREEN: Antibody Screen: NEGATIVE

## 2012-08-29 LAB — OB RESULTS CONSOLE RUBELLA ANTIBODY, IGM: Rubella: IMMUNE

## 2012-08-29 LAB — OB RESULTS CONSOLE RPR: RPR: NONREACTIVE

## 2012-08-29 LAB — OB RESULTS CONSOLE ABO/RH: RH Type: POSITIVE

## 2012-08-29 LAB — OB RESULTS CONSOLE GC/CHLAMYDIA
Chlamydia: NEGATIVE
Gonorrhea: NEGATIVE

## 2012-08-29 LAB — OB RESULTS CONSOLE HEPATITIS B SURFACE ANTIGEN: Hepatitis B Surface Ag: NEGATIVE

## 2012-08-29 LAB — OB RESULTS CONSOLE HIV ANTIBODY (ROUTINE TESTING): HIV: NONREACTIVE

## 2012-09-08 ENCOUNTER — Telehealth: Payer: Self-pay | Admitting: Internal Medicine

## 2012-09-08 NOTE — Telephone Encounter (Signed)
Left message for pt to call back  °

## 2012-09-08 NOTE — Telephone Encounter (Signed)
Last time pt was in Dr. Leone Payor gave her a website and print out about an elimination diet. Pt lost this and would like for information. Pt also is to have allergy testing. Pt would like to have specifically the IGE and IGG drawn. Dr. Leone Payor please advise.

## 2012-09-18 NOTE — Telephone Encounter (Signed)
I have sent her a mychart message with Dr. Marvell Fuller recommendations. I left her a voicemail to message me back via my chart or call.

## 2012-09-18 NOTE — Telephone Encounter (Signed)
I gave her some basic info I suppose - as an FYI but did not recommend elimination diet (six food elimination diet)  She could Google that if desired but I think she would be best served by a formal allergy evaluation  I am not going to order Immunoglobulin levels in her case as I would not know what to do with the results - out of my expertise  Suggest allergy evaluation if she is interested

## 2012-09-19 ENCOUNTER — Encounter: Payer: Self-pay | Admitting: Internal Medicine

## 2012-09-19 ENCOUNTER — Other Ambulatory Visit: Payer: Self-pay

## 2012-09-19 DIAGNOSIS — K5229 Other allergic and dietetic gastroenteritis and colitis: Secondary | ICD-10-CM

## 2012-09-26 ENCOUNTER — Telehealth: Payer: Self-pay

## 2012-09-26 ENCOUNTER — Encounter: Payer: Self-pay | Admitting: Internal Medicine

## 2012-09-26 ENCOUNTER — Ambulatory Visit (INDEPENDENT_AMBULATORY_CARE_PROVIDER_SITE_OTHER): Payer: Medicaid Other | Admitting: Internal Medicine

## 2012-09-26 VITALS — BP 100/62 | HR 60 | Ht 64.0 in | Wt 218.4 lb

## 2012-09-26 DIAGNOSIS — R1013 Epigastric pain: Secondary | ICD-10-CM

## 2012-09-26 DIAGNOSIS — R1314 Dysphagia, pharyngoesophageal phase: Secondary | ICD-10-CM

## 2012-09-26 DIAGNOSIS — R12 Heartburn: Secondary | ICD-10-CM

## 2012-09-26 NOTE — Telephone Encounter (Signed)
Left message to call back with her OB/GYN 's name so we may send office notes from 09/26/12 visit and also her Feb. 2014 visit for their records.

## 2012-09-26 NOTE — Progress Notes (Signed)
  Subjective:    Patient ID: Linda Ware, female    DOB: Aug 06, 1970, 42 y.o.   MRN: [id number]  HPI The patient is here for follow-up. She was last seen in Feb 2014 - she had dysphagia and heartburn/reflux that sounded esophageal in origin. Sxs resolved on PPI - omeprazole. We had discussed that she could have eosinophilic esophagitis and/or GERD. She became pregnant and stopped omeprazole and has been ok except for rare heartburn.  She and husband have discussed possible allergy testing. She has an appointment for July w/ Dr. Maple Hudson. In the past she had stated she had never had allergy testing (has asthma thought to be allergic) but now thinks she might have had skin testing as a child.  Husband on speaker cellphone during interview/visit.  Medications, allergies, past medical history, past surgical history, family history and social history are reviewed and updated in the EMR.  Review of Systems As above - pregnancy ok    Objective:   Physical Exam NAD    Assessment & Plan:  Dysphagia, pharyngoesophageal phase  Abdominal pain, epigastric  Heartburn   All are improved/resolved  1. Observe for now 2. Consider delay of allergy testing while pregnant -  3. Eosinophilic esophagitis handout given again - they had lost and requested 4. I explained that allergy testing is not how the dx of eosinophilic esophagitis is made and that it was a possible dx in her. EGD and bx are necessary. 5. If she becomes symptomatic again I think lansoprazole would be best option as a PPI but can also check w/ OB.  WU:[id number],JYNW, MD

## 2012-09-26 NOTE — Patient Instructions (Addendum)
Consider checking with the allergy Dr. to make sure ok to do testing while your pregnant.  Today you have been given information on Eosinophilic Esophagitis (EOE) to read.   Follow up with [place] as needed.   Thank you for choosing me and [facility].  Iva Boop, M.D., [facility]

## 2012-09-28 LAB — US OB TRANSVAGINAL

## 2012-10-05 ENCOUNTER — Telehealth: Payer: Self-pay

## 2012-10-05 NOTE — Telephone Encounter (Signed)
Left message on her voice mail to call me back and let me know if she wishes for office notes to be sent to her OB/GYN.

## 2012-10-09 ENCOUNTER — Encounter: Payer: Self-pay | Admitting: Internal Medicine

## 2012-10-09 ENCOUNTER — Telehealth: Payer: Self-pay | Admitting: Internal Medicine

## 2012-10-09 NOTE — Progress Notes (Signed)
Patient ID: Linda Ware, female   DOB: 03-25-1971, 42 y.o.   MRN: [id number] Faxed office notes from [facility] Feb and May visit's with Dr. Stan Head to [facility] at fax # [phone], verbal ok from patient to do this.  She see's the various mid-wifes there.

## 2012-10-09 NOTE — Telephone Encounter (Signed)
Pt is only scheduled for consultation and he does not do testing same day.  I called and made pt aware. She voiced her understanding

## 2012-11-09 ENCOUNTER — Other Ambulatory Visit: Payer: Self-pay | Admitting: *Deleted

## 2012-11-09 MED ORDER — BUDESONIDE 180 MCG/ACT IN AEPB: 2.0000 | INHALATION_SPRAY | Freq: Two times a day (BID) | RESPIRATORY_TRACT | Status: DC

## 2012-11-13 ENCOUNTER — Encounter: Payer: Self-pay | Admitting: *Deleted

## 2012-11-13 ENCOUNTER — Encounter: Payer: Medicaid Other | Attending: Obstetrics and Gynecology | Admitting: *Deleted

## 2012-11-13 ENCOUNTER — Institutional Professional Consult (permissible substitution): Payer: Medicaid Other | Admitting: Internal Medicine

## 2012-11-13 VITALS — Ht 64.0 in | Wt 225.0 lb

## 2012-11-13 DIAGNOSIS — E669 Obesity, unspecified: Secondary | ICD-10-CM | POA: Insufficient documentation

## 2012-11-13 DIAGNOSIS — Z713 Dietary counseling and surveillance: Secondary | ICD-10-CM | POA: Insufficient documentation

## 2012-11-13 NOTE — Progress Notes (Signed)
Medical Nutrition Therapy:  Appt start time: 1015 end time:  1115.  Assessment:  Primary concern today: Obesity during pregnancy and excessive weight gain. Patient is currently 4 months pregnant with her 5th child. Due date is April 12, 2013. Her prepregnancy weight is 205 pounds. She reports that in 2010, she lost 112 pounds over one year and kept the weight off for 2 years. She reports being very active during that time. However, she started regaining during the year prior to pregnancy. She reports that her biggest issue is binge eating, but this has resolved during pregnancy due to smaller appetite and acid reflux.   MEDICATIONS: Iron supplement, juice plus, armour thyroid   DIETARY INTAKE:   Usual eating pattern includes 3 meals and 2-3 snacks per day.  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   Nutritional Diagnosis:  [place]-3.3 Overweight/obesity As related to excessive energy intake and physical inactivity.  As evidenced by prepregnant BMI 35.2.    Intervention:  Nutrition counseling. We discussed healthy eating during  pregnancy, including balancing macronutrients, increasing fruits and vegetables, and limiting sugary beverages and desserts. We also discussed healthy weight gain during pregnancy.   Goals:  1. No more than 0.5 pounds weight gain per week during 2nd and 3rd trimester.  2. Plan 3 balanced meals daily, with up to 3 healthy snacks.  3. Decrease intake of sugary drinks and snacks.  4. Increase activity as tolerated.   Handouts given during visit include:  Healthy Eating during Pregnancy  Yellow meal plan card  Monitoring/Evaluation:  Dietary intake, exercise, and body weight in 2 month(s).

## 2012-11-24 ENCOUNTER — Encounter: Payer: Self-pay | Admitting: Emergency Medicine

## 2012-11-24 ENCOUNTER — Ambulatory Visit (INDEPENDENT_AMBULATORY_CARE_PROVIDER_SITE_OTHER): Payer: Medicaid Other | Admitting: Emergency Medicine

## 2012-11-24 VITALS — BP 122/80 | HR 64 | Ht 64.0 in | Wt 222.6 lb

## 2012-11-24 DIAGNOSIS — L989 Disorder of the skin and subcutaneous tissue, unspecified: Secondary | ICD-10-CM | POA: Insufficient documentation

## 2012-11-24 NOTE — Patient Instructions (Addendum)
It was nice to meet you!  The spot doesn't look like anything bad.  It may be a skin tag or an enlarged vein.  Please do not pick at it.  Have your OB check it at your next prenatal visit. It if is getting bigger, starts draining, or becomes more painful, please come back.

## 2012-11-24 NOTE — Assessment & Plan Note (Signed)
Skin tag vs varicose vein. No erythema or fluctuance to suggest infection.  Not vesicular to indicate HSV. Will monitor for now. Warning signs given. Has f/u with OB in early August.

## 2012-11-24 NOTE — Progress Notes (Signed)
  Subjective:    Patient ID: Avalin Ware, female    DOB: Dec 23, 1970, 42 y.o.   MRN: [id number]  HPI Linda Ware is here for a SDA for vaginal lesion.  She reports having a sore spot on her labia for the last 3 days.  Last night she noticed a small bump and tried squeezing it which made it more sore today.  Denies any drainage from the spot.  No fevers, vaginal discharge, new sexual partners.  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  She is currently [redacted] weeks pregnant; [facility].  I have reviewed and updated the following as appropriate: allergies and current medications SHx: non smoker   Review of Systems See HPI    Objective:   Physical Exam BP 122/80  Pulse 64  Ht 5\' 4"  (1.626 m)  Wt 222 lb 9.6 oz (100.971 kg)  BMI 38.19 kg/m2  LMP 07/06/2012 Gen: alert, cooperative, NAD Skin: skin tag noted in left groin; 3mm flesh colored papule on the posterior left labia with some erythema at the top      Assessment & Plan:

## 2012-12-13 ENCOUNTER — Other Ambulatory Visit (INDEPENDENT_AMBULATORY_CARE_PROVIDER_SITE_OTHER): Payer: Medicaid Other

## 2012-12-13 ENCOUNTER — Ambulatory Visit (INDEPENDENT_AMBULATORY_CARE_PROVIDER_SITE_OTHER): Payer: Medicaid Other | Admitting: Internal Medicine

## 2012-12-13 ENCOUNTER — Encounter: Payer: Self-pay | Admitting: Internal Medicine

## 2012-12-13 DIAGNOSIS — T788XXS Other adverse effects, not elsewhere classified, sequela: Secondary | ICD-10-CM

## 2012-12-13 DIAGNOSIS — T781XXA Other adverse food reactions, not elsewhere classified, initial encounter: Secondary | ICD-10-CM

## 2012-12-13 DIAGNOSIS — L500 Allergic urticaria: Secondary | ICD-10-CM

## 2012-12-13 LAB — CBC WITH DIFFERENTIAL/PLATELET
Basophils Absolute: 0 10*3/uL (ref 0.0–0.1)
Basophils Relative: 0.3 % (ref 0.0–3.0)
Eosinophils Absolute: 0.1 10*3/uL (ref 0.0–0.7)
Eosinophils Relative: 1.3 % (ref 0.0–5.0)
HCT: 30.8 % — ABNORMAL LOW (ref 36.0–46.0)
Hemoglobin: 10.1 g/dL — ABNORMAL LOW (ref 12.0–15.0)
Lymphocytes Relative: 15.1 % (ref 12.0–46.0)
Lymphs Abs: 1.5 10*3/uL (ref 0.7–4.0)
MCHC: 32.8 g/dL (ref 30.0–36.0)
MCV: 78.8 fl (ref 78.0–100.0)
Monocytes Absolute: 0.5 10*3/uL (ref 0.1–1.0)
Monocytes Relative: 4.7 % (ref 3.0–12.0)
Neutro Abs: 7.9 10*3/uL — ABNORMAL HIGH (ref 1.4–7.7)
Neutrophils Relative %: 78.6 % — ABNORMAL HIGH (ref 43.0–77.0)
Platelets: 215 10*3/uL (ref 150.0–400.0)
RBC: 3.91 Mil/uL (ref 3.87–5.11)
RDW: 15.9 % — ABNORMAL HIGH (ref 11.5–14.6)
WBC: 10.1 10*3/uL (ref 4.5–10.5)

## 2012-12-13 NOTE — Patient Instructions (Addendum)
Order- lab- Food IgE profile, Food IgG profile, CBC w/ diff   Dx food allergy

## 2012-12-13 NOTE — Progress Notes (Signed)
12/13/12- 41 yoF former smoker referred courtesy of Dr Leone Payor-? food allergies   6 months pregnant. Complains of irritation in the throat, a sense of closure in the throat and stomach pain over the past 2 years. She questions food allergy. Upper endoscopy 2 years ago. No identified specific foods. She kept a food diary in the past without benefit. Off of acid blockers since pregnant, but they did help her GI complaints. Past history of asthma and seasonal allergic rhinitis. Cats and dogs have caused hives with close contact. Medical history of sleep apnea, thyroidectomy for cancer, tonsillectomy.  Quit smoking 1995. Married with 4 children Several family members with allergies or asthma.  Prior to Admission medications   Medication Sig Start Date End Date Taking? Authorizing Provider  ARMOUR THYROID 60 MG tablet Take 60 mg by mouth daily.  09/06/11  Yes Historical Provider, MD  Ascorbic Acid (VITAMIN C) 250 MG CHEW Chew by mouth.   Yes Historical Provider, MD  budesonide (PULMICORT) 180 MCG/ACT inhaler Inhale 2 puffs into the lungs 2 (two) times daily. 11/09/12  Yes Bryan R Hess, DO  Cholecalciferol (VITAMIN D PO) Take by mouth.   Yes Historical Provider, MD  Cyanocobalamin (VITAMIN B-12) 1000 MCG SUBL Place under the tongue.   Yes Historical Provider, MD   Past Medical History  Diagnosis Date  . Iron deficiency anemia   . Alcohol abuse     in college, none in years  . 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 GERD (gastroesophageal reflux disease)   . Kidney stone   . Sleep apnea     intermittent  . Obesity   . Asthma, allergic   . Rosacea   . Plantar fasciitis   . Pregnant     [redacted] weeks   Past Surgical History  Procedure Laterality Date  . Thyroidectomy  2007  . Hernia repair    . Esophagogastroduodenoscopy  02/19/2009    Dr. Lurline Del  . Kidney stone removal      x multiple occasions   Family History  Problem Relation Age of Onset  . Asthma Mother   . Asthma  Maternal Grandmother   . Dementia Maternal Grandmother   . Dementia Maternal Grandfather   . Melanoma Maternal Grandfather   . Breast cancer Paternal Grandmother   . Colon cancer Cousin   . Heart attack Maternal Grandfather   . Heart attack Paternal Uncle   . Diabetes type II Maternal Grandfather   . Diabetes type II Paternal Aunt   . Diabetes type II Cousin   . Diabetes type II Paternal Uncle   . Hyperlipidemia Mother   . Hyperlipidemia Paternal Grandmother   . Hyperlipidemia Paternal Grandfather   . Hypertension Maternal Grandmother   . Hypertension Paternal Grandfather   . Hypertension Paternal Uncle   . Hypertension Paternal Aunt   . Kidney disease    . Stroke Maternal Uncle   . Hyperthyroidism Father   . Hypothyroidism Paternal Grandmother   . Hypothyroidism Paternal Grandfather   . Celiac disease Sister     questionable  . Multiple myeloma Maternal Grandmother   . Pancreatic cancer Paternal Grandfather   . Inflammatory bowel disease Paternal Grandmother    History   Social History  . Marital Status: Married    Spouse Name: N/A    Number of Children: 4  . Years of Education: N/A   Occupational History  . waitress, nanny, homeschool teacher,etc    Social History Main Topics  .  Smoking status: Former Smoker    Types: Cigarettes    Quit date: 05/10/1993  . Smokeless tobacco: Never Used  . Alcohol Use: No     Comment: occasional  . Drug Use: No  . Sexual Activity: Not on file   Other Topics Concern  . Not on file   Social History Narrative  . No narrative on file   ROS-see HPI Constitutional:   No-   weight loss, night sweats, fevers, chills, fatigue, lassitude. HEENT:   + headaches, +difficulty swallowing, tooth/dental problems, sore throat,       No-  sneezing, itching, ear ache, +nasal congestion, post nasal drip,  CV:  No-   chest pain, orthopnea, PND, swelling in lower extremities, anasarca, dizziness, palpitations Resp: + shortness of breath with  exertion or at rest.              No-   productive cough,  No non-productive cough,  No- coughing up of blood.              No-   change in color of mucus.  No- wheezing.   Skin: No-   rash or lesions. GI:  +heartburn,+ indigestion, No-abdominal pain, nausea, vomiting, diarrhea,                 change in bowel habits, loss of appetite GU: No-   dysuria, change in color of urine, no urgency or frequency.  No- flank pain. MS:  No-   joint pain or swelling.  No- decreased range of motion.  No- back pain. Neuro-     nothing unusual Psych:  No- change in mood or affect. No depression or anxiety.  No memory loss.  OBJ- Physical Exam General- Alert, Oriented, Affect-appropriate, Distress- none acute Skin- rash-none, lesions- none, excoriation- none. No- Dermographism Lymphadenopathy- none Head- atraumatic            Eyes- Gross vision intact, PERRLA, conjunctivae and secretions clear            Ears- Hearing, canals-normal            Nose- +turbinate edema, no-Septal dev, mucus, polyps, erosion, perforation             Throat- Mallampati II , mucosa clear , drainage- none, tonsils- atrophic Neck- flexible , trachea midline, no stridor , thyroid nl, carotid no bruit Chest - symmetrical excursion , unlabored           Heart/CV- RRR , no murmur , no gallop  , no rub, nl s1 s2                           - JVD- none , edema- none, stasis changes- none, varices- none           Lung- clear to P&A, wheeze- none, cough- none , dullness-none, rub- none           Chest wall-  Abd- tender-no, distended-no, bowel sounds-present, HSM- no Br/ Gen/ Rectal- Not done, not indicated Extrem- cyanosis- none, clubbing, none, atrophy- none, strength- nl Neuro- grossly intact to observation

## 2012-12-14 LAB — ALLERGEN FOOD PROFILE SPECIFIC IGE
Apple: <0.1 kU/L
Chicken IgE: <0.1 kU/L
Corn: <0.1 kU/L
Egg White IgE: <0.1 kU/L
Fish Cod: <0.1 kU/L
IgE (Immunoglobulin E), Serum: 61.4 IU/mL (ref 0.0–180.0)
Milk IgE: <0.1 kU/L
Orange: <0.1 kU/L
Peanut IgE: <0.1 kU/L
Shrimp IgE: <0.1 kU/L
Soybean IgE: <0.1 kU/L
Tomato IgE: <0.1 kU/L
Tuna IgE: <0.1 kU/L
Wheat IgE: <0.1 kU/L

## 2012-12-19 LAB — IGG FOOD PANEL
Allergen, Milk, IgG: 23.7 ug/mL — ABNORMAL HIGH (ref <0.15)
Beef, IgG: 7.4 ug/mL — ABNORMAL HIGH (ref <2.0)
Chicken, IgG: <0.15 ug/mL (ref <0.15)
Corn, IgG: <0.15 ug/mL (ref <0.15)
Egg white, IgG: 9.1 ug/mL — ABNORMAL HIGH (ref <2.0)
Egg yolk, IgG: 6.1 ug/mL — ABNORMAL HIGH (ref <2.0)
Peanut, IgG: 1.24 ug/mL — ABNORMAL HIGH (ref <0.15)
Wheat, IgG: 1.44 ug/mL — ABNORMAL HIGH (ref <0.15)

## 2012-12-22 ENCOUNTER — Telehealth: Payer: Self-pay | Admitting: Internal Medicine

## 2012-12-22 NOTE — Telephone Encounter (Signed)
Notes Recorded by Ronny Bacon, CMA on 12/22/2012 at 1:57 PM LMTCB ------  Notes Recorded by Waymon Budge, MD on 12/21/2012 at 8:51 AM Routine food allergy antibodies testing for IgE are negative- all in the normal range. There are some IgG Antibody levels elevated for beef, egg yolk, egg white, milk, peanut, wheat. It is less clear what this means. I just tell people to pay attention when they eat these, to see if they notice any problems in the next day or so. Avoid what clearly causes problems. She is not necessarily "allergic" to these foods.  Spoke with pt and notified of results per Dr. Maple Hudson. Pt verbalized understanding and denied any questions.

## 2012-12-22 NOTE — Progress Notes (Signed)
Quick Note:  Spoke with pt and notified of results per Dr. Young. Pt verbalized understanding and denied any questions.  ______ 

## 2012-12-22 NOTE — Progress Notes (Signed)
Quick Note:  LMTCB ______ 

## 2012-12-25 DIAGNOSIS — L500 Allergic urticaria: Secondary | ICD-10-CM | POA: Insufficient documentation

## 2012-12-25 DIAGNOSIS — Z91018 Allergy to other foods: Secondary | ICD-10-CM | POA: Insufficient documentation

## 2012-12-25 NOTE — Assessment & Plan Note (Signed)
She has not identified specific foods. Her symptoms may all be due to reflux, especially while pregnant and off of acid blockers. Plan-food allergy profiles. I have asked her to again keep a food diary watching for symptom associations.

## 2012-12-25 NOTE — Assessment & Plan Note (Signed)
Appropriate to avoid contact and use antihistamines

## 2013-01-01 ENCOUNTER — Ambulatory Visit (INDEPENDENT_AMBULATORY_CARE_PROVIDER_SITE_OTHER): Payer: Medicaid Other | Admitting: Family Medicine

## 2013-01-01 ENCOUNTER — Encounter: Payer: Self-pay | Admitting: Family Medicine

## 2013-01-01 VITALS — BP 115/64 | HR 71 | Temp 99.2°F | Ht 64.0 in | Wt 233.0 lb

## 2013-01-01 DIAGNOSIS — J4520 Mild intermittent asthma, uncomplicated: Secondary | ICD-10-CM

## 2013-01-01 DIAGNOSIS — J45909 Unspecified asthma, uncomplicated: Secondary | ICD-10-CM

## 2013-01-01 DIAGNOSIS — Z3201 Encounter for pregnancy test, result positive: Secondary | ICD-10-CM

## 2013-01-01 MED ORDER — PREDNISONE 50 MG PO TABS: 50.0000 mg | ORAL_TABLET | Freq: Every day | ORAL | Status: DC

## 2013-01-01 MED ORDER — ALBUTEROL SULFATE HFA 108 (90 BASE) MCG/ACT IN AERS: 2.0000 | INHALATION_SPRAY | RESPIRATORY_TRACT | Status: DC | PRN

## 2013-01-01 MED ORDER — OLOPATADINE HCL 0.2 % OP SOLN: 1.0000 [drp] | Freq: Every day | OPHTHALMIC | Status: DC | PRN

## 2013-01-01 NOTE — Patient Instructions (Signed)
Thank you for coming in, today! I will treat you for a asthma exacerbation. I have prescribed a short course of oral prednisone. Take 50 mg by mouth once a day for 5 days. I also refilled your albuterol to use 2 puffs once every 4 hours as needed. Make an appointment to see Dr. Paulina Fusi (your new PCP) in 2-3 weeks. Come back sooner if you have any new issues or questions. Please feel free to call with any questions or concerns at any time, at [phone]. --Dr. Casper Harrison

## 2013-01-03 ENCOUNTER — Telehealth: Payer: Self-pay | Admitting: Family Medicine

## 2013-01-03 MED ORDER — FLUTICASONE-SALMETEROL 100-50 MCG/DOSE IN AEPB: 1.0000 | INHALATION_SPRAY | Freq: Two times a day (BID) | RESPIRATORY_TRACT | Status: DC

## 2013-01-03 NOTE — Telephone Encounter (Signed)
Advair set in. Please call patient to inform her. Also let her know I'm happy to send in Rx's like this since she saw me for a visit about it, but please gently reiterate in general big changes to chronic meds will need to come from her PCP. Thanks. --CMS

## 2013-01-03 NOTE — Telephone Encounter (Signed)
Patient is calling about seeing Dr. Casper Harrison about her asthma issues.  She can't get in to see Dr. Paulina Fusi for 2 weeks.  She is asking for Dr. Paulina Fusi to send in her Advair because it works better than the Pulmicort, and it will go to [facility] please.

## 2013-01-04 ENCOUNTER — Ambulatory Visit: Payer: Medicaid Other

## 2013-01-04 ENCOUNTER — Ambulatory Visit: Payer: Medicaid Other | Admitting: *Deleted

## 2013-01-04 NOTE — Telephone Encounter (Signed)
Spoke with patient and informed her of below 

## 2013-01-06 NOTE — Assessment & Plan Note (Signed)
Followed by private OBGYN. Reports "no major issues so far" with pregnancy, though is concerned about medications. Advised to f/u with primary OB as needed.

## 2013-01-06 NOTE — Assessment & Plan Note (Signed)
A: Mild exacerbation with likely viral illness given several family members with similar symptoms, low-grade temps but well appearance, and no frank respiratory distress on exam. Running low on albuterol.  P: Rx for prednisone 50 mg for 5 days at this visit. Refilled albuterol. Pt called the next day requesting change from budesonide back to Advair; normally would have deferred to PCP but pt had been seen specifically for this issue and has been on Advair in the past. Rx given for Advair though pt notified that major changes like this typically need to be through her PCP. Pt will f/u with Dr. Paulina Fusi for future needs, though advised to come back at any time to see any provider for acute issues.

## 2013-01-06 NOTE — Progress Notes (Signed)
  Subjective:    Patient ID: Linda Ware, female    DOB: [birth year], 42 y.o.   MRN: [id number]  HPI: Pt presents to clinic with complaint of asthma worse for 2-3 weeks. Pt states she thinks "bronchitis" is going around the house; her oldest son had similar symptoms about 3 weeks ago and got a "steroid shot" at his doctor's office, and other family members have had similar symptoms. Pt describes cough, sort throat, occasional low-grade fever, some tightness in chest with occasional SOB worse with the cough (though cough has been the major complaint). Last week has been somehwat better. Pt reports compliance with budesonide; previously was on Advair, though changed to Pulmicort due to being pregnant (currently ~25 weeks). Also complains of allergic dry/itchy eyes, for which she takes Pataday (normally requires visit from her optometrist for refills, but requests refill today).  Note, pt is in an "asthma study" at [facility] and checks peak flows daily, and has been using albuterol 8 puffs per day or more with her current symptoms. Pt has been on a study drug in the recent past, then was on Advair, then switched to budesonide, but feels Advair worked the best and requests to go back on this.  Review of Systems: As above. Generally feels well otherwise.     Objective:   Physical Exam BP 115/64  Pulse 71  Temp(Src) 99.2 F (37.3 C) (Oral)  Ht 5\' 4"  (1.626 m)  Wt 233 lb (105.688 kg)  BMI 39.97 kg/m2  SpO2 100%  LMP 07/06/2012  Gen: non-toxic-appearing adult female, very pleasant though with slightly anxious affect when discussing meds/pregnancy HEENT: [place]/AT, conjunctivae and sclerae clear, nasal mucosa slightly inflamed, post oropharynx with mild redness but no edema/exudate Pulm: scattered wheeze, protracted expiratory phase, no frank inc WOB or resp distress during interview Cardio: RRR, no murmur, no tachycardia Ext: warm, well-perfused, no LE edema Abd: soft, nontender, gravid in contour      Assessment & Plan:

## 2013-01-11 ENCOUNTER — Ambulatory Visit: Payer: Medicaid Other | Admitting: *Deleted

## 2013-01-18 ENCOUNTER — Ambulatory Visit: Payer: Medicaid Other | Admitting: Family Medicine

## 2013-01-24 ENCOUNTER — Ambulatory Visit: Payer: Medicaid Other | Admitting: Family Medicine

## 2013-01-24 ENCOUNTER — Encounter: Payer: Self-pay | Admitting: Family Medicine

## 2013-01-24 ENCOUNTER — Ambulatory Visit (INDEPENDENT_AMBULATORY_CARE_PROVIDER_SITE_OTHER): Payer: Medicaid Other | Admitting: Family Medicine

## 2013-01-24 VITALS — BP 113/48 | HR 73 | Temp 99.0°F | Ht 64.0 in | Wt 240.0 lb

## 2013-01-24 DIAGNOSIS — J4520 Mild intermittent asthma, uncomplicated: Secondary | ICD-10-CM

## 2013-01-24 DIAGNOSIS — Z85850 Personal history of malignant neoplasm of thyroid: Secondary | ICD-10-CM

## 2013-01-24 DIAGNOSIS — J45909 Unspecified asthma, uncomplicated: Secondary | ICD-10-CM

## 2013-01-24 HISTORY — DX: Personal history of malignant neoplasm of thyroid: Z85.850

## 2013-01-24 NOTE — Patient Instructions (Signed)

## 2013-01-24 NOTE — Progress Notes (Signed)
Linda Ware is a 42 y.o. female who presents today for asthma f/u.  Mild Intermittent Asthma - Well controlled since switching to advair three weeks ago.  Denies nighttime awakenings, exacerbations, limitations in activities.  Uses albuterol 1 x per week w/o problems.   Past Medical History  Diagnosis Date  . Iron deficiency anemia   . Alcohol abuse     in college, none in years  . 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 GERD (gastroesophageal reflux disease)   . Kidney stone   . Sleep apnea     intermittent  . Obesity   . Asthma, allergic   . Rosacea   . Plantar fasciitis   . Pregnant     [redacted] weeks    History  Smoking status  . Former Smoker  . Types: Cigarettes  . Quit date: 05/10/1993  Smokeless tobacco  . Never Used    Family History  Problem Relation Age of Onset  . Asthma Mother   . Asthma Maternal Grandmother   . Dementia Maternal Grandmother   . Dementia Maternal Grandfather   . Melanoma Maternal Grandfather   . Breast cancer Paternal Grandmother   . Colon cancer Cousin   . Heart attack Maternal Grandfather   . Heart attack Paternal Uncle   . Diabetes type II Maternal Grandfather   . Diabetes type II Paternal Aunt   . Diabetes type II Cousin   . Diabetes type II Paternal Uncle   . Hyperlipidemia Mother   . Hyperlipidemia Paternal Grandmother   . Hyperlipidemia Paternal Grandfather   . Hypertension Maternal Grandmother   . Hypertension Paternal Grandfather   . Hypertension Paternal Uncle   . Hypertension Paternal Aunt   . Kidney disease    . Stroke Maternal Uncle   . Hyperthyroidism Father   . Hypothyroidism Paternal Grandmother   . Hypothyroidism Paternal Grandfather   . Celiac disease Sister     questionable  . Multiple myeloma Maternal Grandmother   . Pancreatic cancer Paternal Grandfather   . Inflammatory bowel disease Paternal Grandmother     Current Outpatient Prescriptions on File Prior to Visit  Medication Sig Dispense Refill   . albuterol (PROVENTIL HFA;VENTOLIN HFA) 108 (90 BASE) MCG/ACT inhaler Inhale 2 puffs into the lungs every 4 (four) hours as needed for wheezing or shortness of breath.  1 Inhaler  0  . ARMOUR THYROID 60 MG tablet Take 60 mg by mouth daily.       . Ascorbic Acid (VITAMIN C) 250 MG CHEW Chew by mouth.      . budesonide (PULMICORT) 180 MCG/ACT inhaler Inhale 2 puffs into the lungs 2 (two) times daily.  1 Inhaler  1  . Cholecalciferol (VITAMIN D PO) Take by mouth.      . Cyanocobalamin (VITAMIN B-12) 1000 MCG SUBL Place under the tongue.      Marland Kitchen Fluticasone-Salmeterol (ADVAIR) 100-50 MCG/DOSE AEPB Inhale 1 puff into the lungs 2 (two) times daily.  1 each  3  . Olopatadine HCl 0.2 % SOLN Apply 1 drop to eye daily as needed.  2.5 mL  0  . predniSONE (DELTASONE) 50 MG tablet Take 1 tablet (50 mg total) by mouth daily.  5 tablet  0   No current facility-administered medications on file prior to visit.    ROS: Per HPI.  All other systems reviewed and are negative.   Physical Exam Filed Vitals:   01/24/13 1413  BP: 113/48  Pulse: 73  Temp: 99 F (  37.2 C)    Physical Examination: General appearance - alert, well appearing, and in no distress Chest - clear to auscultation, no wheezes, rales or rhonchi, symmetric air entry Heart - normal rate and regular rhythm, no murmurs noted

## 2013-01-24 NOTE — Assessment & Plan Note (Signed)
Well controlled on Advair Discus 1 puff BID.  Uses albuterol inhaler 1 x per week, no nighttime awakenings, and no interference with normal activity.  Will see back PRN.

## 2013-02-10 ENCOUNTER — Emergency Department (HOSPITAL_COMMUNITY)
Admission: EM | Admit: 2013-02-10 | Discharge: 2013-02-10 | Disposition: A | Discharge status: Home / Self Care | Payer: Medicaid Other | Attending: Emergency Medicine | Admitting: Emergency Medicine

## 2013-02-10 ENCOUNTER — Encounter (HOSPITAL_COMMUNITY): Payer: Self-pay | Admitting: *Deleted

## 2013-02-10 DIAGNOSIS — R142 Eructation: Secondary | ICD-10-CM | POA: Insufficient documentation

## 2013-02-10 DIAGNOSIS — Z8719 Personal history of other diseases of the digestive system: Secondary | ICD-10-CM | POA: Insufficient documentation

## 2013-02-10 DIAGNOSIS — O26893 Other specified pregnancy related conditions, third trimester: Secondary | ICD-10-CM

## 2013-02-10 DIAGNOSIS — E669 Obesity, unspecified: Secondary | ICD-10-CM | POA: Insufficient documentation

## 2013-02-10 DIAGNOSIS — Z87891 Personal history of nicotine dependence: Secondary | ICD-10-CM | POA: Insufficient documentation

## 2013-02-10 DIAGNOSIS — Z85850 Personal history of malignant neoplasm of thyroid: Secondary | ICD-10-CM | POA: Insufficient documentation

## 2013-02-10 DIAGNOSIS — R141 Gas pain: Secondary | ICD-10-CM | POA: Insufficient documentation

## 2013-02-10 DIAGNOSIS — Z8739 Personal history of other diseases of the musculoskeletal system and connective tissue: Secondary | ICD-10-CM | POA: Insufficient documentation

## 2013-02-10 DIAGNOSIS — Z79899 Other long term (current) drug therapy: Secondary | ICD-10-CM | POA: Insufficient documentation

## 2013-02-10 DIAGNOSIS — O9989 Other specified diseases and conditions complicating pregnancy, childbirth and the puerperium: Secondary | ICD-10-CM | POA: Insufficient documentation

## 2013-02-10 DIAGNOSIS — O99019 Anemia complicating pregnancy, unspecified trimester: Secondary | ICD-10-CM | POA: Insufficient documentation

## 2013-02-10 DIAGNOSIS — J45901 Unspecified asthma with (acute) exacerbation: Secondary | ICD-10-CM | POA: Insufficient documentation

## 2013-02-10 DIAGNOSIS — Z872 Personal history of diseases of the skin and subcutaneous tissue: Secondary | ICD-10-CM | POA: Insufficient documentation

## 2013-02-10 DIAGNOSIS — Z87442 Personal history of urinary calculi: Secondary | ICD-10-CM | POA: Insufficient documentation

## 2013-02-10 LAB — BASIC METABOLIC PANEL
BUN: 10 mg/dL (ref 6–23)
CO2: 21 mEq/L (ref 19–32)
Calcium: 9 mg/dL (ref 8.4–10.5)
Chloride: 107 mEq/L (ref 96–112)
Creatinine, Ser: 0.59 mg/dL (ref 0.50–1.10)
GFR calc Af Amer: >90 mL/min (ref >90)
GFR calc non Af Amer: >90 mL/min (ref >90)
Glucose, Bld: 101 mg/dL — ABNORMAL HIGH (ref 70–99)
Potassium: 4.1 mEq/L (ref 3.5–5.1)
Sodium: 139 mEq/L (ref 135–145)

## 2013-02-10 LAB — CBC WITH DIFFERENTIAL/PLATELET
Basophils Absolute: 0 10*3/uL (ref 0.0–0.1)
Basophils Relative: 0 % (ref 0–1)
Eosinophils Absolute: 0.2 10*3/uL (ref 0.0–0.7)
Eosinophils Relative: 2 % (ref 0–5)
HCT: 31.2 % — ABNORMAL LOW (ref 36.0–46.0)
Hemoglobin: 10.4 g/dL — ABNORMAL LOW (ref 12.0–15.0)
Lymphocytes Relative: 19 % (ref 12–46)
Lymphs Abs: 2.4 10*3/uL (ref 0.7–4.0)
MCH: 25.9 pg — ABNORMAL LOW (ref 26.0–34.0)
MCHC: 33.3 g/dL (ref 30.0–36.0)
MCV: 77.8 fL — ABNORMAL LOW (ref 78.0–100.0)
Monocytes Absolute: 0.7 10*3/uL (ref 0.1–1.0)
Monocytes Relative: 6 % (ref 3–12)
Neutro Abs: 9.2 10*3/uL — ABNORMAL HIGH (ref 1.7–7.7)
Neutrophils Relative %: 73 % (ref 43–77)
Platelets: 194 10*3/uL (ref 150–400)
RBC: 4.01 MIL/uL (ref 3.87–5.11)
RDW: 14.7 % (ref 11.5–15.5)
WBC: 12.6 10*3/uL — ABNORMAL HIGH (ref 4.0–10.5)

## 2013-02-10 LAB — D-DIMER, QUANTITATIVE: D-Dimer, Quant: 0.56 ug/mL-FEU — ABNORMAL HIGH (ref 0.00–0.48)

## 2013-02-10 NOTE — Discharge Instructions (Signed)

## 2013-02-10 NOTE — ED Notes (Addendum)
Pt states that she has been using her PEEK and she has not been able to take as deep of breaths as she usually has for the past couple of days. Pt states she still has medication for asthma but no relief from albuterol. Pt has no wheezing noted in triage. Pt states wheezing comes and goes. Pt 8 months pregnant.

## 2013-02-10 NOTE — ED Notes (Addendum)
Pt states her trigger is normally allergy related but states she has not been near any allergens. Pt states she has not been wheezing much but she feels tightness in her chest and peak flow is not her normal. Pt states she has had 8 puffs of inhaler today. Pt is in no respiratory distress.

## 2013-02-10 NOTE — ED Provider Notes (Signed)
CSN: [id number]     Arrival date & time 02/10/13  2014 History  .This chart was scribed for non-physician practitioner, Mora Bellman, PA-C,working with Hurman Horn, MD, by Karle Plumber, ED Scribe.  This patient was seen in room TR10C/TR10C and the patient's care was started at 9:40 PM.  Chief Complaint  Patient presents with  . Asthma   The history is provided by the patient. No language interpreter was used.   HPI Comments:  Linda Ware is a 42 y.o. female who presents to the Emergency Department complaining of intermittent chest tightness and wheezing onset 2 days. She reports using her albuterol inhaler daily on average and has been using it more frequently for the past 2 days with no relief. She states she uses her Advair daily as prescribed. She reports the symptoms worsen throughout the day regardless if she is moving around or not. She states she has had a slightly productive cough for the past two days as well. She measures her breathing with her peak flow meter daily. She reports a slight decrease in the readings. She denies lower extremity edema, chest pain, heart palpitations or fevers. She denies h/o DVT or taking any long trips recently.  Past Medical History  Diagnosis Date  . Iron deficiency anemia   . Alcohol abuse     in college, none in years  . 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 GERD (gastroesophageal reflux disease)   . Kidney stone   . Sleep apnea     intermittent  . Obesity   . Asthma, allergic   . Rosacea   . Plantar fasciitis   . Pregnant     [redacted] weeks   Past Surgical History  Procedure Laterality Date  . Thyroidectomy  2007  . Hernia repair    . Esophagogastroduodenoscopy  02/19/2009    Dr. Lurline Del  . Kidney stone removal      x multiple occasions   Family History  Problem Relation Age of Onset  . Asthma Mother   . Asthma Maternal Grandmother   . Dementia Maternal Grandmother   . Dementia Maternal Grandfather   .  Melanoma Maternal Grandfather   . Breast cancer Paternal Grandmother   . Colon cancer Cousin   . Heart attack Maternal Grandfather   . Heart attack Paternal Uncle   . Diabetes type II Maternal Grandfather   . Diabetes type II Paternal Aunt   . Diabetes type II Cousin   . Diabetes type II Paternal Uncle   . Hyperlipidemia Mother   . Hyperlipidemia Paternal Grandmother   . Hyperlipidemia Paternal Grandfather   . Hypertension Maternal Grandmother   . Hypertension Paternal Grandfather   . Hypertension Paternal Uncle   . Hypertension Paternal Aunt   . Kidney disease    . Stroke Maternal Uncle   . Hyperthyroidism Father   . Hypothyroidism Paternal Grandmother   . Hypothyroidism Paternal Grandfather   . Celiac disease Sister     questionable  . Multiple myeloma Maternal Grandmother   . Pancreatic cancer Paternal Grandfather   . Inflammatory bowel disease Paternal Grandmother    History  Substance Use Topics  . Smoking status: Former Smoker    Types: Cigarettes    Quit date: 05/10/1993  . Smokeless tobacco: Never Used  . Alcohol Use: No     Comment: occasional   OB History   Grav Para Term Preterm Abortions TAB SAB Ect Mult Living   1  Review of Systems  Constitutional: Negative for fever.  Respiratory: Positive for chest tightness and shortness of breath.   Gastrointestinal: Negative for nausea, vomiting and diarrhea.  All other systems reviewed and are negative.    Allergies  Clindamycin/lincomycin  Home Medications   Current Outpatient Rx  Name  Route  Sig  Dispense  Refill  . albuterol (PROVENTIL HFA;VENTOLIN HFA) 108 (90 BASE) MCG/ACT inhaler   Inhalation   Inhale 2 puffs into the lungs every 4 (four) hours as needed for wheezing or shortness of breath.   1 Inhaler   0     Please dispense proventil or generic if available  ...   . ARMOUR THYROID 60 MG tablet   Oral   Take 60 mg by mouth daily.          . Ascorbic Acid (VITAMIN C) 250 MG  CHEW   Oral   Chew by mouth.         . budesonide (PULMICORT) 180 MCG/ACT inhaler   Inhalation   Inhale 2 puffs into the lungs 2 (two) times daily.   1 Inhaler   1   . Cholecalciferol (VITAMIN D PO)   Oral   Take by mouth.         . Cyanocobalamin (VITAMIN B-12) 1000 MCG SUBL   Sublingual   Place under the tongue.         Marland Kitchen Fluticasone-Salmeterol (ADVAIR) 100-50 MCG/DOSE AEPB   Inhalation   Inhale 1 puff into the lungs 2 (two) times daily.   1 each   3   . Olopatadine HCl 0.2 % SOLN   Ophthalmic   Apply 1 drop to eye daily as needed.   2.5 mL   0    Triage Vitals: BP 132/66  Pulse 79  Temp(Src) 97.9 F (36.6 C) (Oral)  Resp 20  Wt 243 lb 7 oz (110.423 kg)  BMI 41.77 kg/m2  SpO2 99%  LMP 07/06/2012 Physical Exam  Nursing note and vitals reviewed. Constitutional: She is oriented to person, place, and time. She appears well-developed and well-nourished. No distress.  HENT:  Head: Normocephalic and atraumatic.  Right Ear: External ear normal.  Left Ear: External ear normal.  Nose: Nose normal.  Mouth/Throat: Oropharynx is clear and moist.  Eyes: Conjunctivae are normal.  Neck: Normal range of motion.  Cardiovascular: Normal rate, regular rhythm and normal heart sounds.   Pulmonary/Chest: Effort normal and breath sounds normal. No stridor. No respiratory distress. She has no wheezes. She has no rales.  Abdominal: Soft. She exhibits distension (pregnant).  Musculoskeletal: Normal range of motion.  Neurological: She is alert and oriented to person, place, and time. She has normal strength.  Skin: Skin is warm and dry. She is not diaphoretic. No erythema.  Psychiatric: She has a normal mood and affect. Her behavior is normal.    ED Course  Procedures (including critical care time) DIAGNOSTIC STUDIES: Oxygen Saturation is 99% on RA, normal by my interpretation.   COORDINATION OF CARE: 9:47 PM- Will discuss with Dr. Fonnie Jarvis for appropriate course of  treatment. Pt verbalizes understanding and agrees to plan.  Medications - No data to display  Labs Review Labs Reviewed  D-DIMER, QUANTITATIVE - Abnormal; Notable for the following:    D-Dimer, Quant 0.56 (*)    All other components within normal limits  CBC WITH DIFFERENTIAL - Abnormal; Notable for the following:    WBC 12.6 (*)    Hemoglobin 10.4 (*)    HCT 31.2 (*)  MCV 77.8 (*)    MCH 25.9 (*)    Neutro Abs 9.2 (*)    All other components within normal limits  BASIC METABOLIC PANEL - Abnormal; Notable for the following:    Glucose, Bld 101 (*)    All other components within normal limits   Imaging Review No results found.  MDM   1. Shortness of breath due to pregnancy, third trimester    She presents with worsening shortness of breath over the past few days. She is 8 months pregnant. She measures her peak flow meter daily he noticed a slight decrease in her normal members. She has been using albuterol daily without relief of her symptoms. On exam lungs are clear to auscultation, oxygen saturation is 99% on room air, patient is not tachycardic. Blood work was drawn. D-dimer is 0.56 which came confidently rule out pulmonary embolism. She is to followup with her primary physician. Strict return instructions were given. Vital signs stable for discharge. Discussed case with Dr. Fonnie Jarvis who agrees with plan. Patient / Family / Caregiver informed of clinical course, understand medical decision-making process, and agree with plan.    I personally performed the services described in this documentation, which was scribed in my presence. The recorded information has been reviewed and is accurate.     Mora Bellman, PA-C 02/10/13 [phone]

## 2013-02-11 ENCOUNTER — Telehealth: Payer: Self-pay | Admitting: Family Medicine

## 2013-02-11 DIAGNOSIS — J45901 Unspecified asthma with (acute) exacerbation: Secondary | ICD-10-CM

## 2013-02-11 MED ORDER — PREDNISONE 20 MG PO TABS: 20.0000 mg | ORAL_TABLET | Freq: Every day | ORAL | Status: DC

## 2013-02-11 NOTE — Telephone Encounter (Signed)
[facility] After Hours Line.   Patient seen in ED yesterday due to SOB which felt typical of asthma exacerbations for her. She states she has noted a drop in peak flow from 350 to 250.  In Ed, had d-dimer which was negative. Thought was that difficulty breathing was due to being 8 months pregnant. Lungs were clear on ED visit but patient states she was wheezing before she went and has been wheezing throughout today and using albuterol q2 hours while awake. Patient sounds comfortable on the phone and speaks in full sentences. No edema noted yesterday, patient without complaints of HA or RUQ pain to suggest preeclampsia. Last BP was 102/66 yesterday. No fever.Has had some cough/congestion which she thinks may have been trigger.   Concern for asthma exacerbation. Told patient i would send in low dose prednisone 20mg  daily for 5 days and wanted her to be seen Monday or Tuesday if not getting much better or to go to ED if acutely worsens. Scheduled albuterol q4 over next 24 hours.   Aldine Contes. Marti Sleigh, MD, PGY3 02/11/2013 1:58 PM

## 2013-02-11 NOTE — ED Provider Notes (Signed)
Medical screening examination/treatment/procedure(s) were performed by non-physician practitioner and as supervising physician I was immediately available for consultation/collaboration.   Hurman Horn, MD 02/11/13 1240

## 2013-02-15 ENCOUNTER — Encounter: Payer: Self-pay | Admitting: Family Medicine

## 2013-02-15 ENCOUNTER — Ambulatory Visit (INDEPENDENT_AMBULATORY_CARE_PROVIDER_SITE_OTHER): Payer: Medicaid Other | Admitting: Family Medicine

## 2013-02-15 DIAGNOSIS — J45901 Unspecified asthma with (acute) exacerbation: Secondary | ICD-10-CM

## 2013-02-15 MED ORDER — FLUTICASONE-SALMETEROL 250-50 MCG/DOSE IN AEPB: 1.0000 | INHALATION_SPRAY | Freq: Two times a day (BID) | RESPIRATORY_TRACT | Status: DC

## 2013-02-15 NOTE — Progress Notes (Signed)
Subjective:     Patient ID: Alexianna Nachreiner, female   DOB: December 03, 1970, 42 y.o.   MRN: [id number]  HPI 42 y.o. F here with asthma on advair 100/50 and albuterol. Has been having worsening peak flow at home to 270s, usually in 350s. Using albuterol daily. Recntly took 5d course of prednisone 20mg . At end of burst starting to feel better. No Abx used at this time. Pt otherwise no n/v, d/c, f/c, cp,ha, or other complaints.  Normal fetal movement, no LOF, no vb, no ctx. OB care with [facility]  Review of Systems     Objective:   Physical Exam  Filed Vitals:   02/15/13 1442  BP: 122/64  Pulse: 81  Temp: 98.1 F (36.7 C)  98% RA, used albterol at 1200  VSS NAD communicated in complete sentence Minimal end expiratory wheeze diffuse lungs No r/c RRR murmur at RUSB.       Assessment:     Staceyann Knouff is a 42 y.o. [id number] here with worsening control of her asthma during pregnancy.    Plan:     Currently poorly controlled with frequent albuterol use and recent PO steroid course. WIll increase advair to 250/50 and f/u in 2 weeks for reevaluation. Goal will be to return to lower dose steroid following delivery of infant or improved control of symptoms. May trial antihistamine if allergies seem to be related to exacerbation as well.  Tawana Scale, MD OB Fellow

## 2013-02-15 NOTE — Patient Instructions (Signed)
Please continue taking your peak flows and advair. Return 2 weeks for reevaluation.

## 2013-03-15 ENCOUNTER — Other Ambulatory Visit: Payer: Self-pay

## 2013-03-16 ENCOUNTER — Encounter: Payer: Self-pay | Admitting: Family Medicine

## 2013-03-16 ENCOUNTER — Ambulatory Visit (INDEPENDENT_AMBULATORY_CARE_PROVIDER_SITE_OTHER): Payer: Medicaid Other | Admitting: Family Medicine

## 2013-03-16 VITALS — BP 132/66 | Wt 248.0 lb

## 2013-03-16 DIAGNOSIS — N898 Other specified noninflammatory disorders of vagina: Secondary | ICD-10-CM

## 2013-03-16 DIAGNOSIS — Z3480 Encounter for supervision of other normal pregnancy, unspecified trimester: Secondary | ICD-10-CM

## 2013-03-16 DIAGNOSIS — Z3483 Encounter for supervision of other normal pregnancy, third trimester: Secondary | ICD-10-CM

## 2013-03-16 LAB — POCT WET PREP (WET MOUNT): Clue Cells Wet Prep Whiff POC: NEGATIVE

## 2013-03-16 LAB — POCT HEMOGLOBIN: Hemoglobin: 11.9 g/dL — AB (ref 12.2–16.2)

## 2013-03-16 MED ORDER — CLOTRIMAZOLE 1 % VA CREA: 1.0000 | TOPICAL_CREAM | Freq: Every day | VAGINAL | Status: DC

## 2013-03-16 NOTE — Progress Notes (Signed)
42 year old female, G5P5 @ ~ 36 weeks who presents to the clinic today with several concerns.  She is being followed by [facility] but has recently been dissatisfied with her care and thus presents today.  No contractions, loss of fluid, vaginal bleeding.   She test positive for GBS (urine culture) earlier in her pregnancy.  She was recently seen by her OB-GYN and wanted GBS testing and this was refused.  Patient reports that she would like testing today as it is going to help her make a decision about antibiotics during delivery (she is on the fence about receiving antibiotics).  Additionally, patient reports vaginal itching and discharge x 3 days.   Patient would also like Hb checked today.   Physical exam:  General: well appearing, NAD. Pelvic exam: clear/white discharge noted.  Sample for wet prep obtained. See vitals and notes section above.   Assessment/Plan: GBS obtained after long discussion with patient.  I informed her that no matter the result, antibiotics are going to be recommended.   Wet prep obtained today and revealed Yeast.  Will treat with clotrimazole x 7 days.  POCT hemoglobin also obtained.  Patient urged to continue close follow up with OB-GYN.

## 2013-03-16 NOTE — Patient Instructions (Signed)
It was nice to see you today.  I am treating you for a yeast infection.  Please be sure to follow up closely with your OB-GYN.

## 2013-03-18 LAB — STREP B DNA PROBE: GBSP: POSITIVE

## 2013-03-19 ENCOUNTER — Encounter: Payer: Self-pay | Admitting: Family Medicine

## 2013-03-19 ENCOUNTER — Telehealth: Payer: Self-pay | Admitting: Family Medicine

## 2013-03-19 NOTE — Telephone Encounter (Signed)
I'll forward this to [facility] since he saw the pt on Friday.  Thanks, Twana First. Paulina Fusi, DO of [facility] 03/19/2013, 9:54 AM

## 2013-03-19 NOTE — Telephone Encounter (Signed)
Husband Renae Fickle called about test results from friday Please advise

## 2013-03-19 NOTE — Telephone Encounter (Signed)
Patient was GBS positive. Hemoglobin was good at 11.9 (up from 10.4). Please inform patient.

## 2013-03-19 NOTE — Telephone Encounter (Signed)
Please call with results from labs taken on 11/7.

## 2013-03-20 ENCOUNTER — Telehealth: Payer: Self-pay | Admitting: Family Medicine

## 2013-03-20 ENCOUNTER — Encounter: Payer: Self-pay | Admitting: Family Medicine

## 2013-03-20 NOTE — Telephone Encounter (Signed)
Pt called and wanted lab results. Husband has also called for results. JW

## 2013-03-20 NOTE — Telephone Encounter (Signed)
Please send to Dr. Adriana Simas as he was the one that had seen the pt and gotten lab work on 11/7.  Dr. Adriana Simas had replied to the previous telephone message, with the lab data.  Thanks, Twana First. Paulina Fusi, DO of [facility] 03/20/2013, 12:48 PM

## 2013-03-21 ENCOUNTER — Encounter: Payer: Self-pay | Admitting: Family Medicine

## 2013-03-21 NOTE — Telephone Encounter (Signed)
Spoke with patient and gave the requested information to her and her husband was on line also

## 2013-03-25 ENCOUNTER — Encounter (HOSPITAL_COMMUNITY): Payer: Self-pay | Admitting: *Deleted

## 2013-03-25 ENCOUNTER — Inpatient Hospital Stay (HOSPITAL_COMMUNITY)
Admission: AD | Admit: 2013-03-25 | Discharge: 2013-03-25 | Disposition: A | Discharge status: Home / Self Care | Payer: Medicaid Other | Source: Ambulatory Visit | Attending: Obstetrics and Gynecology | Admitting: Obstetrics and Gynecology

## 2013-03-25 DIAGNOSIS — O479 False labor, unspecified: Secondary | ICD-10-CM | POA: Insufficient documentation

## 2013-03-25 DIAGNOSIS — Z87891 Personal history of nicotine dependence: Secondary | ICD-10-CM | POA: Insufficient documentation

## 2013-03-25 NOTE — MAU Note (Signed)
Pt states here for labor eval. Ctx's now q5-6 minutes apart. Denies lof, no bleeding. Lost mucus plug. Was 1cm Tuesday in office. Ctx's stronger since 1-2 this afternoon.

## 2013-03-25 NOTE — MAU Provider Note (Signed)
History   42yo, [id number] at [redacted]w[redacted]d presents for UCs Q 5-6 minutes intensifying since 1330 today. Reports loss of mucus plug. Was seen in the office on Tuesday - 1 cm / 50% -3.  Now pt reports UCs are dissipating.  Denies VB, LOF, recent fever, resp or GI c/o's, UTI or PIH s/s. GFM.   Chief Complaint  Patient presents with  . Labor Eval   HPI  OB History   Grav Para Term Preterm Abortions TAB SAB Ect Mult Living   5 4 4  0 0 0 0 0 0 4      Past Medical History  Diagnosis Date  . Iron deficiency anemia   . Alcohol abuse     in college, none in years  . 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 GERD (gastroesophageal reflux disease)   . Kidney stone   . Sleep apnea     intermittent  . Obesity   . Asthma, allergic   . Rosacea   . Plantar fasciitis   . Pregnant     [redacted] weeks    Past Surgical History  Procedure Laterality Date  . Thyroidectomy  2007  . Hernia repair    . Esophagogastroduodenoscopy  02/19/2009    Dr. Lurline Del  . Kidney stone removal      x multiple occasions    Family History  Problem Relation Age of Onset  . Asthma Mother   . Asthma Maternal Grandmother   . Dementia Maternal Grandmother   . Dementia Maternal Grandfather   . Melanoma Maternal Grandfather   . Breast cancer Paternal Grandmother   . Colon cancer Cousin   . Heart attack Maternal Grandfather   . Heart attack Paternal Uncle   . Diabetes type II Maternal Grandfather   . Diabetes type II Paternal Aunt   . Diabetes type II Cousin   . Diabetes type II Paternal Uncle   . Hyperlipidemia Mother   . Hyperlipidemia Paternal Grandmother   . Hyperlipidemia Paternal Grandfather   . Hypertension Maternal Grandmother   . Hypertension Paternal Grandfather   . Hypertension Paternal Uncle   . Hypertension Paternal Aunt   . Kidney disease    . Stroke Maternal Uncle   . Hyperthyroidism Father   . Hypothyroidism Paternal Grandmother   . Hypothyroidism Paternal Grandfather   . Celiac  disease Sister     questionable  . Multiple myeloma Maternal Grandmother   . Pancreatic cancer Paternal Grandfather   . Inflammatory bowel disease Paternal Grandmother     History  Substance Use Topics  . Smoking status: Former Smoker    Types: Cigarettes    Quit date: 05/10/1993  . Smokeless tobacco: Never Used  . Alcohol Use: No     Comment: occasional    Allergies:  Allergies  Allergen Reactions  . Clindamycin/Lincomycin Rash    Prescriptions prior to admission  Medication Sig Dispense Refill  . albuterol (PROVENTIL HFA;VENTOLIN HFA) 108 (90 BASE) MCG/ACT inhaler Inhale 2 puffs into the lungs every 4 (four) hours as needed for wheezing or shortness of breath.  1 Inhaler  0  . ARMOUR THYROID 60 MG tablet Take 90-120 mg by mouth 2 (two) times daily. 2 tabs in am and 1.5 tabs in evening      . Ascorbic Acid (VITAMIN C) 250 MG CHEW Chew 250 mg by mouth daily.       . Fluticasone-Salmeterol (ADVAIR DISKUS) 250-50 MCG/DOSE AEPB Inhale 1 puff into the lungs 2 (two) times  daily.  1 each  3  . IRON PO Take 1 tablet by mouth daily.      Marland Kitchen MAGNESIUM PO Take 1 tablet by mouth daily.      . Olopatadine HCl 0.2 % SOLN Apply 1 drop to eye daily as needed.  2.5 mL  0  . Prenatal Vit-Fe Fumarate-FA (PRENATAL MULTIVITAMIN) TABS tablet Take 1 tablet by mouth daily at 12 noon.      . clotrimazole (GYNE-LOTRIMIN) 1 % vaginal cream Place 1 Applicatorful vaginally at bedtime. For 7 days.  45 g  0    ROS ROS: see HPI above, all other systems are negative  Physical Exam   Blood pressure 123/84, pulse 65, temperature 98.2 F (36.8 C), temperature source Oral, resp. rate 18, height 5\' 4"  (1.626 m), weight 252 lb 4 oz (114.42 kg), last menstrual period 07/06/2012, SpO2 99.00%.  Physical Exam Chest: Clear Heart: RRR Abdomen: gravid, NT Extremities: WNL  @1600  Dilation: 1 Effacement (%): 50 Cervical Position: Posterior Station: -2 Presentation: Vertex Exam by:: Makinzy Cleere, CNM   @1700  Dilation: 1 Effacement (%): 50 Cervical Position: Posterior Station: -2 Presentation: Vertex Exam by:: Claudy Abdallah, CNM  UCs becoming regular on toco  @1900  Dilation: 1 Effacement (%): 50 Cervical Position: Posterior Station: -2 Presentation: Vertex Exam by:: Sekai Gitlin, CNM   FHT: Reactive NST UCs: Q 3 min  ED Course  IUP at [redacted]w[redacted]d Labor check  No cervical change  D/c home with precautions F/u at next [facility] 11/18  Haroldine Laws CNM, MSN 03/25/2013 4:01 PM

## 2013-03-25 NOTE — MAU Note (Signed)
42 yo, G5P4 at [redacted]w[redacted]d, presents to MAU with c/o intermittent contractions intensifying since 1330 today. Reports +FM. Denies VB, LOF.  Patient desires no IV access.

## 2013-03-31 ENCOUNTER — Encounter: Payer: Self-pay | Admitting: Family Medicine

## 2013-04-10 ENCOUNTER — Encounter (HOSPITAL_COMMUNITY): Payer: Self-pay | Admitting: *Deleted

## 2013-04-10 ENCOUNTER — Inpatient Hospital Stay (HOSPITAL_COMMUNITY)
Admission: AD | Admit: 2013-04-10 | Discharge: 2013-04-10 | Disposition: A | Discharge status: Home / Self Care | Payer: Medicaid Other | Source: Ambulatory Visit | Attending: Obstetrics and Gynecology | Admitting: Obstetrics and Gynecology

## 2013-04-10 DIAGNOSIS — O479 False labor, unspecified: Secondary | ICD-10-CM | POA: Insufficient documentation

## 2013-04-11 NOTE — Telephone Encounter (Signed)
Preadmission screen  

## 2013-04-13 ENCOUNTER — Inpatient Hospital Stay (HOSPITAL_COMMUNITY)
Admission: RE | Admit: 2013-04-13 | Discharge: 2013-04-15 | DRG: 775 | Disposition: A | Discharge status: Home / Self Care | Payer: Medicaid Other | Source: Ambulatory Visit | Attending: Obstetrics and Gynecology | Admitting: Obstetrics and Gynecology

## 2013-04-13 ENCOUNTER — Encounter (HOSPITAL_COMMUNITY): Payer: Medicaid Other | Admitting: Anesthesiology

## 2013-04-13 ENCOUNTER — Encounter (HOSPITAL_COMMUNITY): Payer: Self-pay

## 2013-04-13 ENCOUNTER — Inpatient Hospital Stay (HOSPITAL_COMMUNITY): Payer: Medicaid Other | Admitting: Anesthesiology

## 2013-04-13 DIAGNOSIS — D649 Anemia, unspecified: Secondary | ICD-10-CM | POA: Diagnosis not present

## 2013-04-13 DIAGNOSIS — O99892 Other specified diseases and conditions complicating childbirth: Secondary | ICD-10-CM | POA: Diagnosis present

## 2013-04-13 DIAGNOSIS — Z22330 Carrier of Group B streptococcus: Secondary | ICD-10-CM

## 2013-04-13 DIAGNOSIS — Z3490 Encounter for supervision of normal pregnancy, unspecified, unspecified trimester: Secondary | ICD-10-CM

## 2013-04-13 DIAGNOSIS — O3660X0 Maternal care for excessive fetal growth, unspecified trimester, not applicable or unspecified: Principal | ICD-10-CM | POA: Diagnosis present

## 2013-04-13 DIAGNOSIS — O09529 Supervision of elderly multigravida, unspecified trimester: Secondary | ICD-10-CM | POA: Diagnosis present

## 2013-04-13 DIAGNOSIS — D62 Acute posthemorrhagic anemia: Secondary | ICD-10-CM | POA: Diagnosis not present

## 2013-04-13 DIAGNOSIS — E669 Obesity, unspecified: Secondary | ICD-10-CM | POA: Diagnosis present

## 2013-04-13 DIAGNOSIS — O99820 Streptococcus B carrier state complicating pregnancy: Secondary | ICD-10-CM

## 2013-04-13 DIAGNOSIS — O9903 Anemia complicating the puerperium: Secondary | ICD-10-CM | POA: Diagnosis not present

## 2013-04-13 LAB — TYPE AND SCREEN
ABO/RH(D): A POS
Antibody Screen: NEGATIVE

## 2013-04-13 LAB — CBC
HCT: 34.6 % — ABNORMAL LOW (ref 36.0–46.0)
Hemoglobin: 11.4 g/dL — ABNORMAL LOW (ref 12.0–15.0)
MCH: 25.5 pg — ABNORMAL LOW (ref 26.0–34.0)
MCHC: 32.9 g/dL (ref 30.0–36.0)
MCV: 77.4 fL — ABNORMAL LOW (ref 78.0–100.0)
Platelets: 192 10*3/uL (ref 150–400)
RBC: 4.47 MIL/uL (ref 3.87–5.11)
RDW: 14.6 % (ref 11.5–15.5)
WBC: 12.8 10*3/uL — ABNORMAL HIGH (ref 4.0–10.5)

## 2013-04-13 LAB — RPR: RPR Ser Ql: NONREACTIVE

## 2013-04-13 LAB — ABO/RH: ABO/RH(D): A POS

## 2013-04-13 MED ORDER — OXYTOCIN 40 UNITS IN LACTATED RINGERS INFUSION - SIMPLE MED
62.5000 mL/h | INTRAVENOUS | Status: DC
  Administered 2013-04-13: 62.5 mL/h via INTRAVENOUS
  Filled 2013-04-13: qty 1000

## 2013-04-13 MED ORDER — THYROID 60 MG PO TABS
90.0000 mg | ORAL_TABLET | Freq: Every day | ORAL | Status: DC
  Administered 2013-04-13 – 2013-04-14 (×2): 90 mg via ORAL
  Filled 2013-04-13 (×4): qty 1

## 2013-04-13 MED ORDER — OXYCODONE-ACETAMINOPHEN 5-325 MG PO TABS: 1.0000 | ORAL_TABLET | ORAL | Status: DC | PRN

## 2013-04-13 MED ORDER — IBUPROFEN 600 MG PO TABS: 600.0000 mg | ORAL_TABLET | Freq: Four times a day (QID) | ORAL | Status: DC | PRN

## 2013-04-13 MED ORDER — PHENYLEPHRINE 40 MCG/ML (10ML) SYRINGE FOR IV PUSH (FOR BLOOD PRESSURE SUPPORT)
80.0000 ug | PREFILLED_SYRINGE | INTRAVENOUS | Status: DC | PRN
  Filled 2013-04-13: qty 2

## 2013-04-13 MED ORDER — LACTATED RINGERS IV SOLN: 500.0000 mL | INTRAVENOUS | Status: DC | PRN

## 2013-04-13 MED ORDER — SIMETHICONE 80 MG PO CHEW: 80.0000 mg | CHEWABLE_TABLET | ORAL | Status: DC | PRN

## 2013-04-13 MED ORDER — MOMETASONE FURO-FORMOTEROL FUM 100-5 MCG/ACT IN AERO
2.0000 | INHALATION_SPRAY | Freq: Two times a day (BID) | RESPIRATORY_TRACT | Status: DC
  Administered 2013-04-13 – 2013-04-15 (×4): 2 via RESPIRATORY_TRACT
  Filled 2013-04-13: qty 8.8

## 2013-04-13 MED ORDER — ZOLPIDEM TARTRATE 5 MG PO TABS: 5.0000 mg | ORAL_TABLET | Freq: Every evening | ORAL | Status: DC | PRN

## 2013-04-13 MED ORDER — BENZOCAINE-MENTHOL 20-0.5 % EX AERO
1.0000 "application " | INHALATION_SPRAY | CUTANEOUS | Status: DC | PRN
  Filled 2013-04-13: qty 56

## 2013-04-13 MED ORDER — MEASLES, MUMPS & RUBELLA VAC ~~LOC~~ INJ: 0.5000 mL | INJECTION | Freq: Once | SUBCUTANEOUS | Status: DC

## 2013-04-13 MED ORDER — IBUPROFEN 600 MG PO TABS
600.0000 mg | ORAL_TABLET | Freq: Four times a day (QID) | ORAL | Status: DC
  Administered 2013-04-13 – 2013-04-15 (×6): 600 mg via ORAL
  Filled 2013-04-13 (×6): qty 1

## 2013-04-13 MED ORDER — PRENATAL MULTIVITAMIN CH
1.0000 | ORAL_TABLET | Freq: Every day | ORAL | Status: DC
  Administered 2013-04-14: 1 via ORAL
  Filled 2013-04-13: qty 1

## 2013-04-13 MED ORDER — DIPHENHYDRAMINE HCL 50 MG/ML IJ SOLN: 12.5000 mg | INTRAMUSCULAR | Status: DC | PRN

## 2013-04-13 MED ORDER — ONDANSETRON HCL 4 MG/2ML IJ SOLN: 4.0000 mg | INTRAMUSCULAR | Status: DC | PRN

## 2013-04-13 MED ORDER — ACETAMINOPHEN 325 MG PO TABS: 650.0000 mg | ORAL_TABLET | ORAL | Status: DC | PRN

## 2013-04-13 MED ORDER — PRENATAL MULTIVITAMIN CH: 1.0000 | ORAL_TABLET | Freq: Every day | ORAL | Status: DC

## 2013-04-13 MED ORDER — FLEET ENEMA 7-19 GM/118ML RE ENEM: 1.0000 | ENEMA | Freq: Every day | RECTAL | Status: DC | PRN

## 2013-04-13 MED ORDER — LACTATED RINGERS IV SOLN
INTRAVENOUS | Status: DC
  Administered 2013-04-13: 125 mL/h via INTRAVENOUS

## 2013-04-13 MED ORDER — FENTANYL 2.5 MCG/ML BUPIVACAINE 1/10 % EPIDURAL INFUSION (WH - ANES)
14.0000 mL/h | INTRAMUSCULAR | Status: DC | PRN
  Administered 2013-04-13: 14 mL/h via EPIDURAL
  Filled 2013-04-13: qty 125

## 2013-04-13 MED ORDER — DIPHENHYDRAMINE HCL 25 MG PO CAPS: 25.0000 mg | ORAL_CAPSULE | Freq: Four times a day (QID) | ORAL | Status: DC | PRN

## 2013-04-13 MED ORDER — EPHEDRINE 5 MG/ML INJ
10.0000 mg | INTRAVENOUS | Status: DC | PRN
  Filled 2013-04-13: qty 4
  Filled 2013-04-13: qty 2

## 2013-04-13 MED ORDER — LIDOCAINE HCL (PF) 1 % IJ SOLN
INTRAMUSCULAR | Status: DC | PRN
  Administered 2013-04-13 (×2): 5 mL

## 2013-04-13 MED ORDER — DIBUCAINE 1 % RE OINT
1.0000 "application " | TOPICAL_OINTMENT | RECTAL | Status: DC | PRN
  Administered 2013-04-13: 1 via RECTAL
  Filled 2013-04-13: qty 28

## 2013-04-13 MED ORDER — WITCH HAZEL-GLYCERIN EX PADS
1.0000 "application " | MEDICATED_PAD | CUTANEOUS | Status: DC | PRN
  Administered 2013-04-13: 1 via TOPICAL

## 2013-04-13 MED ORDER — ONDANSETRON HCL 4 MG PO TABS: 4.0000 mg | ORAL_TABLET | ORAL | Status: DC | PRN

## 2013-04-13 MED ORDER — SENNOSIDES-DOCUSATE SODIUM 8.6-50 MG PO TABS
2.0000 | ORAL_TABLET | ORAL | Status: DC
  Administered 2013-04-13: 2 via ORAL
  Filled 2013-04-13: qty 2

## 2013-04-13 MED ORDER — LANOLIN HYDROUS EX OINT: TOPICAL_OINTMENT | CUTANEOUS | Status: DC | PRN

## 2013-04-13 MED ORDER — ONDANSETRON HCL 4 MG/2ML IJ SOLN: 4.0000 mg | Freq: Four times a day (QID) | INTRAMUSCULAR | Status: DC | PRN

## 2013-04-13 MED ORDER — OXYTOCIN 40 UNITS IN LACTATED RINGERS INFUSION - SIMPLE MED
1.0000 m[IU]/min | INTRAVENOUS | Status: DC
  Administered 2013-04-13: 2 m[IU]/min via INTRAVENOUS

## 2013-04-13 MED ORDER — LIDOCAINE HCL (PF) 1 % IJ SOLN
30.0000 mL | INTRAMUSCULAR | Status: DC | PRN
  Filled 2013-04-13 (×2): qty 30

## 2013-04-13 MED ORDER — LACTATED RINGERS IV SOLN: 500.0000 mL | Freq: Once | INTRAVENOUS | Status: DC

## 2013-04-13 MED ORDER — EPHEDRINE 5 MG/ML INJ
10.0000 mg | INTRAVENOUS | Status: DC | PRN
  Filled 2013-04-13: qty 2

## 2013-04-13 MED ORDER — OXYTOCIN BOLUS FROM INFUSION: 500.0000 mL | INTRAVENOUS | Status: DC

## 2013-04-13 MED ORDER — BISACODYL 10 MG RE SUPP: 10.0000 mg | Freq: Every day | RECTAL | Status: DC | PRN

## 2013-04-13 MED ORDER — PHENYLEPHRINE 40 MCG/ML (10ML) SYRINGE FOR IV PUSH (FOR BLOOD PRESSURE SUPPORT)
80.0000 ug | PREFILLED_SYRINGE | INTRAVENOUS | Status: DC | PRN
  Filled 2013-04-13: qty 10
  Filled 2013-04-13: qty 2

## 2013-04-13 MED ORDER — THYROID 60 MG PO TABS
120.0000 mg | ORAL_TABLET | Freq: Every day | ORAL | Status: DC
  Administered 2013-04-14 – 2013-04-15 (×2): 120 mg via ORAL
  Filled 2013-04-13 (×4): qty 2

## 2013-04-13 MED ORDER — CITRIC ACID-SODIUM CITRATE 334-500 MG/5ML PO SOLN: 30.0000 mL | ORAL | Status: DC | PRN

## 2013-04-13 NOTE — Progress Notes (Signed)
Patient ID: Linda Ware, female   DOB: 1970/08/08, 42 y.o.   MRN: [id number] Linda Ware is a 42 y.o. G5P4004 at [redacted]w[redacted]d admitted for elective IOL   Subjective: More uncomfortable w ctx, feels more pressure   Objective: BP 120/66  Pulse 75  Temp(Src) 98.7 F (37.1 C) (Oral)  Resp 20  Ht 5\' 4"  (1.626 Ware)  Wt 255 lb (115.667 kg)  BMI 43.75 kg/m2  LMP 07/06/2012     FHT:  Cat 2, occ episodes of mild variables, some early  UC:   toco 3-4   SVE:   Dilation: 3 Effacement (%): 50 Station: -2 Exam by:: felkelrn    Assessment / Plan:  Labor: induction in progress Preeclampsia:  no s/s Fetal Wellbeing:  Category II Pain Control:  Labor support without medications Anticipated MOD:  NSVD  GBS pos - pt declines ABX treatment    Update physician PRN   Linda Ware 04/13/2013, 1:48 PM

## 2013-04-13 NOTE — Progress Notes (Signed)
Discussed risks and benefits of antibiotics for GBS+ per S. Lilliard,CNM, pt verbalized understanding but still declined antibiotics.

## 2013-04-13 NOTE — Progress Notes (Signed)
Nursery RN just leaving from assessment of baby and with collecting CBG after having difficulty obtaining a sample. Phlebotomist was called to assist. Pt and FOB would like to hold pressure longer on baby's heel where CBG was obtained. Notified MBU RN receiving this pt of reason for delay in transfer.

## 2013-04-13 NOTE — Anesthesia Procedure Notes (Signed)
Epidural Patient location during procedure: OB Start time: 04/13/2013 5:06 PM  Staffing Anesthesiologist: Brayton Caves Performed by: anesthesiologist   Preanesthetic Checklist Completed: patient identified, site marked, surgical consent, pre-op evaluation, timeout performed, IV checked, risks and benefits discussed and monitors and equipment checked  Epidural Patient position: sitting Prep: site prepped and draped and DuraPrep Patient monitoring: continuous pulse ox and blood pressure Approach: midline Injection technique: LOR air  Needle:  Needle type: Tuohy  Needle gauge: 17 G Needle length: 9 cm and 9 Needle insertion depth: 7 cm Catheter type: closed end flexible Catheter size: 19 Gauge Catheter at skin depth: 12 cm Test dose: negative  Assessment Events: blood not aspirated, injection not painful, no injection resistance, negative IV test and no paresthesia  Additional Notes Patient identified.  Risk benefits discussed including failed block, incomplete pain control, headache, nerve damage, paralysis, blood pressure changes, nausea, vomiting, reactions to medication both toxic or allergic, and postpartum back pain.  Patient expressed understanding and wished to proceed.  All questions were answered.  Sterile technique used throughout procedure and epidural site dressed with sterile barrier dressing. No paresthesia or other complications noted.The patient did not experience any signs of intravascular injection such as tinnitus or metallic taste in mouth nor signs of intrathecal spread such as rapid motor block. Please see nursing notes for vital signs.

## 2013-04-13 NOTE — H&P (Addendum)
Linda Ware is a 42 y.o. female presenting for elective IOL at [redacted]w[redacted]d. Denies any reg ctx, VB or LOF, reports +FM.   Pregnancy course: Pt began [facility] at [facility] at 12wks GBS+ bacteruria,  [place] at that time c/w LMP dating =EDC 04/12/13  Normal harmony screen  Normal AFP Normal anatomy scan at 22wks Normal 1hr gtt in 3rd trimester Mild anemia, (hgb 10.0), rec Fe supp Growth [place] at 36wks secondary to S>D, EFW >90% Pt requested repeat GBS testing - and was Pos on 03/22/13     Maternal Medical History:  Reason for admission: IOL at [redacted]w[redacted]d  Contractions: Frequency: rare.    Fetal activity: Perceived fetal activity is normal.   Last perceived fetal movement was within the past hour.    Prenatal complications: no prenatal complications      OB History   Grav Para Term Preterm Abortions TAB SAB Ect Mult Living   5 4 4  0 0 0 0 0 0 4     G1- 1997, 37wks, NSVD 6#9oz  G2 - 2000, 40wks, NSVD, 9#13oz  G3 - 2003, 40wks, NSVD 9#8oz G4 - 2006, 40wks, NSVD 9#6oz  G5 - current preg   Past Medical History  Diagnosis Date  . Iron deficiency anemia   . Alcohol abuse     in college, none in years  . 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 GERD (gastroesophageal reflux disease)   . Kidney stone   . Sleep apnea     intermittent  . Obesity   . Asthma, allergic   . Rosacea   . Plantar fasciitis   . Pregnant     [redacted] weeks   Past Surgical History  Procedure Laterality Date  . Thyroidectomy  2007  . Hernia repair    . Esophagogastroduodenoscopy  02/19/2009    Dr. Lurline Del  . Kidney stone removal      x multiple occasions   Family History: family history includes Asthma in her maternal grandmother and mother; Breast cancer in her paternal grandmother; Celiac disease in her sister; Colon cancer in her cousin; Dementia in her maternal grandfather and maternal grandmother; Diabetes type II in her cousin, maternal grandfather, paternal aunt, and paternal uncle; Heart attack in her  maternal grandfather and paternal uncle; Hyperlipidemia in her mother, paternal grandfather, and paternal grandmother; Hypertension in her maternal grandmother, paternal aunt, paternal grandfather, and paternal uncle; Hyperthyroidism in her father; Hypothyroidism in her paternal grandfather and paternal grandmother; Inflammatory bowel disease in her paternal grandmother; Kidney disease in an other family member; Melanoma in her maternal grandfather; Multiple myeloma in her maternal grandmother; Pancreatic cancer in her paternal grandfather; Stroke in her maternal uncle. Social History:  reports that she quit smoking about 19 years ago. Her smoking use included Cigarettes. She smoked 0.00 packs per day. She has never used smokeless tobacco. She reports that she does not drink alcohol or use illicit drugs.   Prenatal Transfer Tool  Maternal Diabetes: No Genetic Screening: Normal Maternal Ultrasounds/Referrals: Normal Fetal Ultrasounds or other Referrals:  None Maternal Substance Abuse:  No Significant Maternal Medications:  None Significant Maternal Lab Results:  Lab values include: Group B Strep positive Other Comments:  None  Review of Systems  All other systems reviewed and are negative.    Dilation: 3 Effacement (%): 50 Station: -2 Exam by:: felkelrn Blood pressure 128/61, pulse 74, temperature 98.7 F (37.1 C), temperature source Oral, resp. rate 20, height 5\' 4"  (1.626 m), weight 255 lb (115.667  kg), last menstrual period 07/06/2012. Maternal Exam:  Uterine Assessment: Contraction frequency is rare.   Abdomen: Patient reports no abdominal tenderness. Fundal height is aga.   Estimated fetal weight is 9#.   Fetal presentation: vertex  Introitus: Normal vulva. Normal vagina.  Pelvis: adequate for delivery.   Cervix: Cervix evaluated by digital exam.     Fetal Exam Fetal Monitor Review: Mode: ultrasound.    Fetal State Assessment: Category I - tracings are  normal.     Physical Exam  Nursing note and vitals reviewed. Constitutional: She is oriented to person, place, and time. She appears well-developed and well-nourished.  HENT:  Head: Normocephalic.  Eyes: Pupils are equal, round, and reactive to light.  Neck: Normal range of motion.  Cardiovascular: Normal rate, regular rhythm and normal heart sounds.   Respiratory: Effort normal and breath sounds normal.  GI: Soft. Bowel sounds are normal.  Genitourinary: Vagina normal.  Musculoskeletal: Normal range of motion.  Neurological: She is alert and oriented to person, place, and time. She has normal reflexes.  Skin: Skin is warm and dry.  Psychiatric: She has a normal mood and affect. Her behavior is normal.    Prenatal labs: ABO, Rh: --/--/A POS (12/05 0750) Antibody: NEG (12/05 0750) Rubella: Immune (04/22 0000) RPR: Nonreactive (04/22 0000)  HBsAg: Negative (04/22 0000)  HIV: Non-reactive (04/22 0000)  GBS: POSITIVE (11/07 0926)   Assessment/Plan: IUP at [redacted]w[redacted]d Desires elective IOL  GBS pos Obesity Grand multiparity  FHR reassuring Hx macrosomia  Favorable cervix   Admit to b.s. Per c/w Dr Pennie Rushing Routine L&D orders Pitocin induction per protocol  Pt declines ABX for GBS prophylaxis, rv'd risks of non-treatment including and up to newborn sepsis or newborn death.  Pt understands these risks and declines ABX rv'd risks of elective IOL including but not limited to: increased risk of c/s, FHR intolerance to labor Pt wishes to proceed  Discussed w Dr Tyrone Apple 04/13/2013, 10:52 AM

## 2013-04-13 NOTE — Anesthesia Preprocedure Evaluation (Signed)
Anesthesia Evaluation  Patient identified by MRN, date of birth, ID band Patient awake    Reviewed: Allergy & Precautions, H&P , Patient's Chart, lab work & pertinent test results  Airway Mallampati: III TM Distance: >3 FB Neck ROM: full    Dental   Pulmonary asthma , sleep apnea , former smoker,  breath sounds clear to auscultation        Cardiovascular Rhythm:regular Rate:Normal     Neuro/Psych  Neuromuscular disease    GI/Hepatic GERD-  ,  Endo/Other  Hypothyroidism Morbid obesity  Renal/GU      Musculoskeletal   Abdominal   Peds  Hematology   Anesthesia Other Findings   Reproductive/Obstetrics (+) Pregnancy                           Anesthesia Physical Anesthesia Plan  ASA: III  Anesthesia Plan: Epidural   Post-op Pain Management:    Induction:   Airway Management Planned:   Additional Equipment:   Intra-op Plan:   Post-operative Plan:   Informed Consent: I have reviewed the patients History and Physical, chart, labs and discussed the procedure including the risks, benefits and alternatives for the proposed anesthesia with the patient or authorized representative who has indicated his/her understanding and acceptance.     Plan Discussed with:   Anesthesia Plan Comments:         Anesthesia Quick Evaluation

## 2013-04-13 NOTE — Progress Notes (Signed)
Patient ID: Linda Ware, female   DOB: 08-02-1970, 42 y.o.   MRN: [id number] Linda Ware is a 42 y.o. G5P4004 at [redacted]w[redacted]d admitted for IOL   Subjective: Requesting epidural, c/o pressure   Objective: BP 127/80  Pulse 75  Temp(Src) 98.5 F (36.9 C) (Oral)  Resp 18  Ht 5\' 4"  (1.626 m)  Wt 255 lb (115.667 kg)  BMI 43.75 kg/m2  LMP 07/06/2012     FHT:  Cat 2, prolonged decel to 90's-110's over about 6 min, mod variability  UC:   toco 2-3  SVE:   Dilation: 5.5 Effacement (%): 100 Station: +1 Exam by:: Corretta Munce,CNM    Assessment / Plan:  Labor: progressing rapidly after srom Preeclampsia:  no s/s Fetal Wellbeing:  Category II Pain Control:  will proceed w epidural  Anticipated MOD:  NSVD  GBS pos, declined ABX  Pitocin off, 02, position changes, vag exam Will recheck after epidural     Dr Pennie Rushing updated    Malissa Hippo 04/13/2013, 4:57 PM

## 2013-04-14 LAB — CBC
HCT: 30.9 % — ABNORMAL LOW (ref 36.0–46.0)
Hemoglobin: 10.2 g/dL — ABNORMAL LOW (ref 12.0–15.0)
MCH: 25.5 pg — ABNORMAL LOW (ref 26.0–34.0)
MCHC: 33 g/dL (ref 30.0–36.0)
MCV: 77.3 fL — ABNORMAL LOW (ref 78.0–100.0)
Platelets: 185 10*3/uL (ref 150–400)
RBC: 4 MIL/uL (ref 3.87–5.11)
RDW: 14.5 % (ref 11.5–15.5)
WBC: 16.2 10*3/uL — ABNORMAL HIGH (ref 4.0–10.5)

## 2013-04-14 NOTE — Progress Notes (Signed)
Post Partum Day 1 Subjective: Reports feeling well.  Ambulating, voiding and tol po liquids and solids without difficulty.  Pos flatus, neg BM.  Breastfeeding without difficulty.  Denies any perineal pain.  Objective: Blood pressure 112/73, pulse 68, temperature 97.8 F (36.6 C), temperature source Oral, resp. rate 18, height 5\' 4"  (1.626 m), weight 115.667 kg (255 lb), last menstrual period 07/06/2012, SpO2 100.00%, unknown if currently breastfeeding.  Physical Exam:  General: alert, cooperative and no distress Heart:  RRR Lungs:  CTA bilat Breasts:  Soft Abd:  Soft, NT with pos BS x 4 quads Lochia: appropriate, sm rubra Uterine Fundus: firm, NT 1 below umb Incision: N/A DVT Evaluation: No evidence of DVT seen on physical exam. Negative Homan's sign bilat No significant calf/ankle edema.   Recent Labs  04/13/13 0750 04/14/13 0550  HGB 11.4* 10.2*  HCT 34.6* 30.9*    Assessment/Plan: Stable s/p vag del at 40w 1d  Continue current care. Anticipate discharge tomorrow. Undecided regarding contraception at present.    LOS: 1 day   Gedeon Brandow O. 04/14/2013, 12:42 PM

## 2013-04-14 NOTE — Anesthesia Postprocedure Evaluation (Signed)
Anesthesia Post Note  Patient: Linda Ware  Procedure(s) Performed: * No procedures listed *  Anesthesia type: Epidural  Patient location: Mother/Baby  Post pain: Pain level controlled  Post assessment: Post-op Vital signs reviewed  Last Vitals:  Filed Vitals:   04/14/13 0550  BP: 112/73  Pulse: 68  Temp: 36.6 C  Resp: 18    Post vital signs: Reviewed  Level of consciousness: awake  Complications: No apparent anesthesia complications

## 2013-04-14 NOTE — Lactation Note (Signed)
This note was copied from the chart of Linda Ware. Lactation Consultation Note; initial visit with experienced BF mom. She reports that baby is nursing great. No questions at present. BF brochure given to mom with resources for support after DC. To call prn  Patient Name: Linda Ware [id number] Date: 04/14/2013 Reason for consult: Initial assessment   Maternal Data Formula Feeding for Exclusion: No Infant to breast within first hour of birth: Yes Does the patient have breastfeeding experience prior to this delivery?: Yes  Feeding Feeding Type: Breast Fed Length of feed: 20 min  LATCH Score/Interventions                      Lactation Tools Discussed/Used     Consult Status Consult Status: Complete    Pamelia Hoit 04/14/2013, 2:19 PM

## 2013-04-15 DIAGNOSIS — O99820 Streptococcus B carrier state complicating pregnancy: Secondary | ICD-10-CM

## 2013-04-15 DIAGNOSIS — D62 Acute posthemorrhagic anemia: Secondary | ICD-10-CM | POA: Diagnosis not present

## 2013-04-15 MED ORDER — IBUPROFEN 600 MG PO TABS: 600.0000 mg | ORAL_TABLET | Freq: Four times a day (QID) | ORAL | Status: DC | PRN

## 2013-04-15 NOTE — Discharge Summary (Signed)
Vaginal Delivery Discharge Summary  Linda Ware  DOB:    May 31, 1970 MRN:    [id number] CSN:    [id number]  Date of admission:                  04/13/13  Date of discharge:                   04/15/13  Procedures this admission:  Date of Delivery: 04/13/13  Newborn Data:  Live born female  Birth Weight: 9 lb 15.3 oz (4515 g) APGAR: 9, 9  Baby in NICU for sepsis w/u.  History of Present Illness:  Ms. Linda Ware is a 42 y.o. female, G5P5005, who presents at [redacted]w[redacted]d weeks gestation. The patient has been followed at the [facility] and Gynecology division of [facility]. She was admitted induction of labor due to elective request and anticipated LGA. Her pregnancy has been complicated by:  Patient Active Problem List   Diagnosis Date Noted  . GBS (group B Streptococcus carrier), +RV culture, currently pregnant--declined treatment 04/15/2013  . Vaginal delivery 04/15/2013  . Pregnancy 04/13/2013  . 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 Allergic urticaria 12/25/2012  . Food allergy, question of 12/25/2012  . GERD (gastroesophageal reflux disease) 08/10/2012  . Rosacea 05/22/2012  . History of dysplastic nevus 05/17/2012  . Venous insufficiency of leg 04/24/2012  . Obesity (BMI 30-39.9) 04/24/2012  . Plantar fasciitis of left foot 12/14/2011  . Rotator cuff syndrome of right shoulder 12/14/2011  . Hypothyroid 10/21/2011  . Asthma, mild intermittent, well-controlled 10/21/2011  . Genetic counseling 10/07/2011     Hospital course:  The patient was admitted for induction of labor due to patient request and anticipated LGA infant.   She declined GBS prophylaxis for her + GBS status.  She was started on pitocin, received an epidural, and progressed well to delivery.  Her labor was complicated by her declination of GBS prophylaxis.  She proceeded to have a vaginal delivery of a viable infant, who was initially stable, but  was transferred to NICU on day 1 due to persistent fever and tachypnea--sepsis work-up was implemented. Her delivery was not complicated. Her postpartum course was not complicated.  She was discharged to home on postpartum day 2 doing well.  Infant remains in NICU at present on ATB.  Patient was considering transferring baby to [facility], which is much closer to her home.  Does want circumcision performed on Friday, 04/20/13.  I consulted with Dr. Rufina Falco baby transferred to [facility], options for circ would be for it to be done at [facility] by staff providers or done as outpatient at [facility], if baby d/c'd from hospital by that date or after.  Printed information for in-Hospital circumcision given to the parents Patient and husband will decide regarding this issue.  Baby was improving on the day patient was d/c'd.  Feeding:  breast  Contraception:  Undecided at present  Discharge hemoglobin:  Hemoglobin  Date Value Range Status  04/14/2013 10.2* 12.0 - 15.0 g/dL Final  12/14/5782 69.6* 12.2 - 16.2 g/dL Final     capillary sample     HCT  Date Value Range Status  04/14/2013 30.9* 36.0 - 46.0 % Final    Discharge Physical Exam:   General: alert Lochia: appropriate Uterine Fundus: firm Incision: Intact DVT Evaluation: No evidence of DVT seen on physical exam. Negative Homan's sign.  Intrapartum Procedures: spontaneous vaginal delivery Postpartum Procedures: none Complications-Operative and Postpartum: none  Discharge Diagnoses: Term Pregnancy-delivered, LGA infant, GBS positive with patient declining intrapartum prophylaxis.                                         Anemia asymptomatic  Discharge Information:  Activity:           Per [facility] handout Diet:                routine Medications: Ibuprofen  Condition:      stable Instructions:   Postpartum Care After Vaginal Delivery  After you deliver your newborn (postpartum period), the usual stay in the hospital is 24 72  hours. If there were problems with your labor or delivery, or if you have other medical problems, you might be in the hospital longer.  While you are in the hospital, you will receive help and instructions on how to care for yourself and your newborn during the postpartum period.  While you are in the hospital:  Be sure to tell your nurses if you have pain or discomfort, as well as where you feel the pain and what makes the pain worse.  If you had an incision made near your vagina (episiotomy) or if you had some tearing during delivery, the nurses may put ice packs on your episiotomy or tear. The ice packs may help to reduce the pain and swelling.  If you are breastfeeding, you may feel uncomfortable contractions of your uterus for a couple of weeks. This is normal. The contractions help your uterus get back to normal size.  It is normal to have some bleeding after delivery.  For the first 1 3 days after delivery, the flow is red and the amount may be similar to a period.  It is common for the flow to start and stop.  In the first few days, you may pass some small clots. Let your nurses know if you begin to pass large clots or your flow increases.  Do not  flush blood clots down the toilet before having the nurse look at them.  During the next 3 10 days after delivery, your flow should become more watery and pink or brown-tinged in color.  Ten to fourteen days after delivery, your flow should be a small amount of yellowish-white discharge.  The amount of your flow will decrease over the first few weeks after delivery. Your flow may stop in 6 8 weeks. Most women have had their flow stop by 12 weeks after delivery.  You should change your sanitary pads frequently.  Wash your hands thoroughly with soap and water for at least 20 seconds after changing pads, using the toilet, or before holding or feeding your newborn.  You should feel like you need to empty your bladder within the first 6 8  hours after delivery.  In case you become weak, lightheaded, or faint, call your nurse before you get out of bed for the first time and before you take a shower for the first time.  Within the first few days after delivery, your breasts may begin to feel tender and full. This is called engorgement. Breast tenderness usually goes away within 48 72 hours after engorgement occurs. You may also notice milk leaking from your breasts. If you are not breastfeeding, do not stimulate your breasts. Breast stimulation can make your breasts produce more milk.  Spending as much time as possible with your newborn  is very important. During this time, you and your newborn can feel close and get to know each other. Having your newborn stay in your room (rooming in) will help to strengthen the bond with your newborn. It will give you time to get to know your newborn and become comfortable caring for your newborn.  Your hormones change after delivery. Sometimes the hormone changes can temporarily cause you to feel sad or tearful. These feelings should not last more than a few days. If these feelings last longer than that, you should talk to your caregiver.  If desired, talk to your caregiver about methods of family planning or contraception.  Talk to your caregiver about immunizations. Your caregiver may want you to have the following immunizations before leaving the hospital:  Tetanus, diphtheria, and pertussis (Tdap) or tetanus and diphtheria (Td) immunization. It is very important that you and your family (including grandparents) or others caring for your newborn are up-to-date with the Tdap or Td immunizations. The Tdap or Td immunization can help protect your newborn from getting ill.  Rubella immunization.  Varicella (chickenpox) immunization.  Influenza immunization. You should receive this annual immunization if you did not receive the immunization during your pregnancy. Document Released: 02/21/2007  Document Revised: 01/19/2012 Document Reviewed: 12/22/2011 [facility] Patient Information 2014 [place], [place].   Postpartum Depression and Baby Blues  The postpartum period begins right after the birth of a baby. During this time, there is often a great amount of joy and excitement. It is also a time of considerable changes in the life of the parent(s). Regardless of how many times a mother gives birth, each child brings new challenges and dynamics to the family. It is not unusual to have feelings of excitement accompanied by confusing shifts in moods, emotions, and thoughts. All mothers are at risk of developing postpartum depression or the "baby blues." These mood changes can occur right after giving birth, or they may occur many months after giving birth. The baby blues or postpartum depression can be mild or severe. Additionally, postpartum depression can resolve rather quickly, or it can be a long-term condition. CAUSES Elevated hormones and their rapid decline are thought to be a main cause of postpartum depression and the baby blues. There are a number of hormones that radically change during and after pregnancy. Estrogen and progesterone usually decrease immediately after delivering your baby. The level of thyroid hormone and various cortisol steroids also rapidly drop. Other factors that play a major role in these changes include major life events and genetics.  RISK FACTORS If you have any of the following risks for the baby blues or postpartum depression, know what symptoms to watch out for during the postpartum period. Risk factors that may increase the likelihood of getting the baby blues or postpartum depression include:  Havinga personal or family history of depression.  Having depression while being pregnant.  Having premenstrual or oral contraceptive-associated mood issues.  Having exceptional life stress.  Having marital conflict.  Lacking a social support network.  Having a  baby with special needs.  Having health problems such as diabetes. SYMPTOMS Baby blues symptoms include:  Brief fluctuations in mood, such as going from extreme happiness to sadness.  Decreased concentration.  Difficulty sleeping.  Crying spells, tearfulness.  Irritability.  Anxiety. Postpartum depression symptoms typically begin within the first month after giving birth. These symptoms include:  Difficulty sleeping or excessive sleepiness.  Marked weight loss.  Agitation.  Feelings of worthlessness.  Lack of interest in  activity or food. Postpartum psychosis is a very concerning condition and can be dangerous. Fortunately, it is rare. Displaying any of the following symptoms is cause for immediate medical attention. Postpartum psychosis symptoms include:  Hallucinations and delusions.  Bizarre or disorganized behavior.  Confusion or disorientation. DIAGNOSIS  A diagnosis is made by an evaluation of your symptoms. There are no medical or lab tests that lead to a diagnosis, but there are various questionnaires that a caregiver may use to identify those with the baby blues, postpartum depression, or psychosis. Often times, a screening tool called the [place] Postnatal Depression Scale is used to diagnose depression in the postpartum period.  TREATMENT The baby blues usually goes away on its own in 1 to 2 weeks. Social support is often all that is needed. You should be encouraged to get adequate sleep and rest. Occasionally, you may be given medicines to help you sleep.  Postpartum depression requires treatment as it can last several months or longer if it is not treated. Treatment may include individual or group therapy, medicine, or both to address any social, physiological, and psychological factors that may play a role in the depression. Regular exercise, a healthy diet, rest, and social support may also be strongly recommended.  Postpartum psychosis is more serious and  needs treatment right away. Hospitalization is often needed. HOME CARE INSTRUCTIONS  Get as much rest as you can. Nap when the baby sleeps.  Exercise regularly. Some women find yoga and walking to be beneficial.  Eat a balanced and nourishing diet.  Do little things that you enjoy. Have a cup of tea, take a bubble bath, read your favorite magazine, or listen to your favorite music.  Avoid alcohol.  Ask for help with household chores, cooking, grocery shopping, or running errands as needed. Do not try to do everything.  Talk to people close to you about how you are feeling. Get support from your partner, family members, friends, or other new moms.  Try to stay positive in how you think. Think about the things you are grateful for.  Do not spend a lot of time alone.  Only take medicine as directed by your caregiver.  Keep all your postpartum appointments.  Let your caregiver know if you have any concerns. SEEK MEDICAL CARE IF: You are having a reaction or problems with your medicine. SEEK IMMEDIATE MEDICAL CARE IF:  You have suicidal feelings.  You feel you may harm the baby or someone else. Document Released: 01/29/2004 Document Revised: 07/19/2011 Document Reviewed: 03/02/2011 [facility] Patient Information 2014 [place], [place].   Discharge to: home  Infant remains hospitalized  Follow-up Information   Follow up with [facility]. Schedule an appointment as soon as possible for a visit in 6 weeks. (Call for any questions or concerns.)    Specialty:  Obstetrics and Gynecology   Contact information:   [address] [place] [address] [phone]       Nigel Bridgeman 04/15/2013

## 2013-04-15 NOTE — Lactation Note (Addendum)
This note was copied from the chart of Linda Josey Dettmann. Lactation Consultation Note  Patient Name: Linda Ware [id number] Date: 04/15/2013 Reason for consult: Follow-up assessment  Visited with Mom and FOB on day of discharge of Mom.  Baby 42 hrs old, and yesterday was transferred to NICU to R/O sepsis.  Course of treatment includes 7 days of IV antibiotics.  Mom has been going up to NICU for every feeding to breast feed.  Pump set up and Mom has been instructed to pump after breast feeding.  Encouraged breast massage, manual breast expression, warm compresses to breast prior to pumping.  Mom states baby nurses well (this is her 5th baby to breast feed).  Comfort Gels given as Mom states her nipples are a little tender, no trauma noted, skin intact.  Mom states she has a pump at home, that was given to her.  She is unclear what type of pump it is, so offered for her to bring it in for an LC to look at it and check the power.  Mom plans on staying close by, possibly the [facility], and use the [employer] pump in the NICU.  Baby may be transferred to [facility] as this would be closer.  Pumping presently and obtaining about 5-7 ml of colostrum.  Volume parameter handout given, as she may choose to sleep through a feeding if she can pump enough EBM for a feeding.  Mom clearly wants to avoid formula supplementation.  Thayer Ohm to follow up during this coming week, unless baby transferred to [facility].    Judee Clara 04/15/2013, 11:54 AM

## 2013-04-15 NOTE — Progress Notes (Signed)
Clinical Social Work Department PSYCHOSOCIAL ASSESSMENT - MATERNAL/CHILD 04/15/2013  Patient:  Linda Ware,Linda Ware  Account Number:  [id number]  Admit Date:  04/13/2013  Childs Name:   Parents haven't decided    Clinical Social Worker:  Verdene Creson, LCSW   Date/Time:  04/15/2013 01:45 PM  Date Referred:  04/15/2013   Referral source  NICU     Referred reason  NICU   Other referral source:    I:  FAMILY / HOME ENVIRONMENT Child's legal guardian:  PARENT  Guardian - Name Guardian - Age Guardian - Address  Linda Ware,Linda Ware 42 [address]  [place], [place] [address]   Other household support members/support persons Other support:    II  PSYCHOSOCIAL DATA Information Source:    Financial and Community Resources Employment:   Spouse is employed   Financial resources:  Medicaid If Medicaid - County:   Other  WIC  Food Stamps   School / Grade:   Maternity Care Coordinator / Child Services Coordination / Early Interventions:  Cultural issues impacting care:    III  STRENGTHS Strengths  Supportive family/friends  Home prepared for Child (including basic supplies)  Adequate Resources   Strength comment:    IV  RISK FACTORS AND CURRENT PROBLEMS Current Problem:     Risk Factor & Current Problem Patient Issue Family Issue Risk Factor / Current Problem Comment   N N     V  SOCIAL WORK ASSESSMENT Met with both parents.  They were pleasant and receptive to social work intervention.  Parents are married.  They have 4 other dependents ages 17, 14, 11, and 8.   Father is employed and mother is a stay at home mom.    Mother was emotional about newborn's NICU admission.  She is struggling with having to leave newborn in the hospital and go home.  Allowed her to vent.  Provided supportive feedback and encouragement.  Mother denies any hx of mental illness.  No substance abuse reported during pregnancy.  Parents communcate strong religious beliefs.   Parents state they were told  newborn may be ready for discharge on Saturday.  No acute social concerns related at this time.      VI SOCIAL WORK PLAN Social Work Plan  Psychosocial Support/Ongoing Assessment of Needs   Hance Caspers J, LCSW  

## 2013-04-16 ENCOUNTER — Ambulatory Visit: Payer: Self-pay

## 2013-04-16 NOTE — Progress Notes (Signed)
Post ur chart review completed. 

## 2013-04-16 NOTE — Lactation Note (Signed)
This note was copied from the chart of Linda Travia Onstad. Lactation Consultation Note    Follow up consult with this mom of a NICU baby, now 64 hours post partum, and 40 4/7 weeks corrected gestation. Mom is exclusively breast feeding/proviing EBM for her baby in the nICU. She reviewed the NICU booklet on providing EBM for a NICU baby today. She spoke to a [facility] peer counselor and is going to get a DEP today. She is beginning to transition into mature milk, expressing up to 10 mls for now. I assisted mom with latching her baby, reminding her he is a newborn, and not an older baby. Mom is an experienced breast feeder of 4 other children. I will follow this mom and baby in the NICU  Patient Name: Linda Ware [id number] Date: 04/16/2013     Maternal Data    Feeding Feeding Type: Breast Milk Length of feed: 5 min  LATCH Score/Interventions                      Lactation Tools Discussed/Used     Consult Status      Alfred Levins 04/16/2013, 3:15 PM

## 2013-05-20 ENCOUNTER — Encounter (HOSPITAL_COMMUNITY): Payer: Self-pay | Admitting: Emergency Medicine

## 2013-05-20 ENCOUNTER — Emergency Department (INDEPENDENT_AMBULATORY_CARE_PROVIDER_SITE_OTHER)
Admission: EM | Admit: 2013-05-20 | Discharge: 2013-05-20 | Disposition: A | Discharge status: Home / Self Care | Payer: Medicaid Other | Source: Home / Self Care | Attending: Family Medicine | Admitting: Family Medicine

## 2013-05-20 DIAGNOSIS — J00 Acute nasopharyngitis [common cold]: Secondary | ICD-10-CM

## 2013-05-20 LAB — POCT INFECTIOUS MONO SCREEN: Mono Screen: NEGATIVE

## 2013-05-20 LAB — POCT RAPID STREP A: Streptococcus, Group A Screen (Direct): NEGATIVE

## 2013-05-20 NOTE — Discharge Instructions (Signed)
Pharyngitis °Pharyngitis is redness, pain, and swelling (inflammation) of your pharynx.  °CAUSES  °Pharyngitis is usually caused by infection. Most of the time, these infections are from viruses (viral) and are part of a cold. However, sometimes pharyngitis is caused by bacteria (bacterial). Pharyngitis can also be caused by allergies. Viral pharyngitis may be spread from person to person by coughing, sneezing, and personal items or utensils (cups, forks, spoons, toothbrushes). Bacterial pharyngitis may be spread from person to person by more intimate contact, such as kissing.  °SIGNS AND SYMPTOMS  °Symptoms of pharyngitis include:   °· Sore throat.   °· Tiredness (fatigue).   °· Low-grade fever.   °· Headache. °· Joint pain and muscle aches. °· Skin rashes. °· Swollen lymph nodes. °· Plaque-like film on throat or tonsils (often seen with bacterial pharyngitis). °DIAGNOSIS  °Your health care provider will ask you questions about your illness and your symptoms. Your medical history, along with a physical exam, is often all that is needed to diagnose pharyngitis. Sometimes, a rapid strep test is done. Other lab tests may also be done, depending on the suspected cause.  °TREATMENT  °Viral pharyngitis will usually get better in 3 4 days without the use of medicine. Bacterial pharyngitis is treated with medicines that kill germs (antibiotics).  °HOME CARE INSTRUCTIONS  °· Drink enough water and fluids to keep your urine clear or pale yellow.   °· Only take over-the-counter or prescription medicines as directed by your health care provider:   °· If you are prescribed antibiotics, make sure you finish them even if you start to feel better.   °· Do not take aspirin.   °· Get lots of rest.   °· Gargle with 8 oz of salt water (½ tsp of salt per 1 qt of water) as often as every 1 2 hours to soothe your throat.   °· Throat lozenges (if you are not at risk for choking) or sprays may be used to soothe your throat. °SEEK MEDICAL  CARE IF:  °· You have large, tender lumps in your neck. °· You have a rash. °· You cough up green, yellow-brown, or bloody spit. °SEEK IMMEDIATE MEDICAL CARE IF:  °· Your neck becomes stiff. °· You drool or are unable to swallow liquids. °· You vomit or are unable to keep medicines or liquids down. °· You have severe pain that does not go away with the use of recommended medicines. °· You have trouble breathing (not caused by a stuffy nose). °MAKE SURE YOU:  °· Understand these instructions. °· Will watch your condition. °· Will get help right away if you are not doing well or get worse. °Document Released: 04/26/2005 Document Revised: 02/14/2013 Document Reviewed: 01/01/2013 °[employer]® Patient Information ©2014 [employer]. ° °

## 2013-05-20 NOTE — ED Notes (Signed)
C/o strep throat States she does have cold sx such as sore throat, sneezing, runny nose, fever, and cough Honey and tea, drinking plenty of fluids, ibuprofen, and vitamin C was used as tx and relief.

## 2013-05-20 NOTE — ED Provider Notes (Signed)
CSN: [id number]     Arrival date & time 05/20/13  1727 History   First MD Initiated Contact with Patient 05/20/13 1758     Chief Complaint  Patient presents with  . Sore Throat   (Consider location/radiation/quality/duration/timing/severity/associated sxs/prior Treatment) HPI Comments: 43 year old female who is currently breast-feeding child presents complaining of 5 days of sore throat and cold symptoms. She has had this consistently for 5 days, mild to moderate in severity. She wanted to come in to make sure she does not have strep throat. She has close contacts with viral upper respiratory infections. Her other symptoms including cough, nasal congestion, rhinorrhea, intermittent subjective fever.   Past Medical History  Diagnosis Date  . Iron deficiency anemia   . Alcohol abuse     in college, none in years  . 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 GERD (gastroesophageal reflux disease)   . Kidney stone   . Sleep apnea     intermittent  . Obesity   . Asthma, allergic   . Rosacea   . Plantar fasciitis   . Pregnant     [redacted] weeks   Past Surgical History  Procedure Laterality Date  . Thyroidectomy  2007  . Hernia repair    . Esophagogastroduodenoscopy  02/19/2009    Dr. Jill Side  . Kidney stone removal      x multiple occasions   Family History  Problem Relation Age of Onset  . Asthma Mother   . Asthma Maternal Grandmother   . Dementia Maternal Grandmother   . Dementia Maternal Grandfather   . Melanoma Maternal Grandfather   . Breast cancer Paternal Grandmother   . Colon cancer Cousin   . Heart attack Maternal Grandfather   . Heart attack Paternal Uncle   . Diabetes type II Maternal Grandfather   . Diabetes type II Paternal Aunt   . Diabetes type II Cousin   . Diabetes type II Paternal Uncle   . Hyperlipidemia Mother   . Hyperlipidemia Paternal Grandmother   . Hyperlipidemia Paternal Grandfather   . Hypertension Maternal Grandmother   . Hypertension  Paternal Grandfather   . Hypertension Paternal Uncle   . Hypertension Paternal Aunt   . Kidney disease    . Stroke Maternal Uncle   . Hyperthyroidism Father   . Hypothyroidism Paternal Grandmother   . Hypothyroidism Paternal Grandfather   . Celiac disease Sister     questionable  . Multiple myeloma Maternal Grandmother   . Pancreatic cancer Paternal Grandfather   . Inflammatory bowel disease Paternal Grandmother    History  Substance Use Topics  . Smoking status: Former Smoker    Types: Cigarettes    Quit date: 05/10/1993  . Smokeless tobacco: Never Used  . Alcohol Use: No     Comment: occasional   OB History   Grav Para Term Preterm Abortions TAB SAB Ect Mult Living   '5 5 5 ' 0 0 0 0 0 0 5     Review of Systems  Constitutional: Positive for fever. Negative for chills.  HENT: Positive for congestion, postnasal drip, rhinorrhea and sore throat. Negative for ear pain and sinus pressure.   Eyes: Negative for visual disturbance.  Respiratory: Positive for cough. Negative for shortness of breath.   Cardiovascular: Negative for chest pain, palpitations and leg swelling.  Gastrointestinal: Negative for nausea, vomiting and abdominal pain.  Endocrine: Negative for polydipsia and polyuria.  Genitourinary: Negative for dysuria, urgency and frequency.  Musculoskeletal: Negative for arthralgias and  myalgias.  Skin: Negative for rash.  Neurological: Negative for dizziness, weakness and light-headedness.    Allergies  Clindamycin/lincomycin  Home Medications   Current Outpatient Rx  Name  Route  Sig  Dispense  Refill  . albuterol (PROVENTIL HFA;VENTOLIN HFA) 108 (90 BASE) MCG/ACT inhaler   Inhalation   Inhale 2 puffs into the lungs every 4 (four) hours as needed for wheezing or shortness of breath.   1 Inhaler   0     Please dispense proventil or generic if available  ...   . ARMOUR THYROID 60 MG tablet   Oral   Take 90-120 mg by mouth 2 (two) times daily. 2 tabs in am and  1.5 tabs in evening         . Ascorbic Acid (VITAMIN C) 250 MG CHEW   Oral   Chew 250 mg by mouth daily.          . Fluticasone-Salmeterol (ADVAIR DISKUS) 250-50 MCG/DOSE AEPB   Inhalation   Inhale 1 puff into the lungs 2 (two) times daily.   1 each   3     Inc steroid dose. Pt has been on 100. May decrease ...   . ibuprofen (ADVIL,MOTRIN) 600 MG tablet   Oral   Take 1 tablet (600 mg total) by mouth every 6 (six) hours as needed.   30 tablet   2   . IRON PO   Oral   Take 1 tablet by mouth daily.         Marland Kitchen MAGNESIUM PO   Oral   Take 1 tablet by mouth daily.         . Prenatal Vit-Fe Fumarate-FA (PRENATAL MULTIVITAMIN) TABS tablet   Oral   Take 1 tablet by mouth daily at 12 noon.          BP 139/65  Pulse 91  Temp(Src) 98.5 F (36.9 C) (Oral)  Resp 16  SpO2 100% Physical Exam  Nursing note and vitals reviewed. Constitutional: She is oriented to person, place, and time. Vital signs are normal. She appears well-developed and well-nourished. No distress.  HENT:  Head: Normocephalic and atraumatic.  Right Ear: External ear normal.  Left Ear: External ear normal.  Nose: Nose normal.  Mouth/Throat: Oropharynx is clear and moist. No oropharyngeal exudate.  Eyes: Conjunctivae are normal. Right eye exhibits no discharge. Left eye exhibits no discharge.  Neck: Normal range of motion. Neck supple.  Cardiovascular: Normal rate, regular rhythm and normal heart sounds.  Exam reveals no gallop and no friction rub.   No murmur heard. Pulmonary/Chest: Effort normal and breath sounds normal. No respiratory distress. She has no wheezes. She has no rales.  Lymphadenopathy:    She has no cervical adenopathy.  Neurological: She is alert and oriented to person, place, and time. She has normal strength. Coordination normal.  Skin: Skin is warm and dry. No rash noted. She is not diaphoretic.  Psychiatric: She has a normal mood and affect. Judgment normal.    ED Course   Procedures (including critical care time) Labs Review Labs Reviewed  POCT RAPID STREP A ([facility] CARE ONLY)  POCT INFECTIOUS MONO SCREEN   Imaging Review No results found.    MDM   1. Acute nasopharyngitis (common cold)    Rapid strep negative, mono negative.  She declines medications, she just wants to wait it out.  F/u PRN        Liam Graham, PA-C 05/20/13 1850

## 2013-05-21 NOTE — ED Provider Notes (Signed)
Medical screening examination/treatment/procedure(s) were performed by resident physician or non-physician practitioner and as supervising physician I was immediately available for consultation/collaboration.   Pauline Good MD.   Billy Fischer, MD 05/21/13 [phone]

## 2013-05-22 LAB — CULTURE, GROUP A STREP

## 2013-08-28 ENCOUNTER — Other Ambulatory Visit: Payer: Self-pay | Admitting: *Deleted

## 2013-08-29 MED ORDER — FLUTICASONE-SALMETEROL 250-50 MCG/DOSE IN AEPB: 1.0000 | INHALATION_SPRAY | Freq: Two times a day (BID) | RESPIRATORY_TRACT | Status: DC

## 2013-09-07 ENCOUNTER — Other Ambulatory Visit: Payer: Self-pay | Admitting: *Deleted

## 2013-09-07 DIAGNOSIS — J4520 Mild intermittent asthma, uncomplicated: Secondary | ICD-10-CM

## 2013-09-07 MED ORDER — ALBUTEROL SULFATE HFA 108 (90 BASE) MCG/ACT IN AERS: 2.0000 | INHALATION_SPRAY | RESPIRATORY_TRACT | Status: DC | PRN

## 2013-10-02 ENCOUNTER — Other Ambulatory Visit: Payer: Self-pay | Admitting: *Deleted

## 2013-10-02 MED ORDER — FLUTICASONE-SALMETEROL 250-50 MCG/DOSE IN AEPB: 1.0000 | INHALATION_SPRAY | Freq: Two times a day (BID) | RESPIRATORY_TRACT | Status: DC

## 2014-01-01 ENCOUNTER — Encounter: Payer: Self-pay | Admitting: Obstetrics and Gynecology

## 2014-01-08 ENCOUNTER — Encounter: Payer: Self-pay | Admitting: Obstetrics and Gynecology

## 2014-03-04 ENCOUNTER — Encounter: Payer: Self-pay | Admitting: Nurse Practitioner

## 2014-03-04 ENCOUNTER — Ambulatory Visit (INDEPENDENT_AMBULATORY_CARE_PROVIDER_SITE_OTHER): Payer: Medicaid Other | Admitting: Nurse Practitioner

## 2014-03-04 ENCOUNTER — Telehealth: Payer: Self-pay | Admitting: Internal Medicine

## 2014-03-04 VITALS — BP 105/71 | HR 81 | Ht 64.0 in | Wt 217.4 lb

## 2014-03-04 DIAGNOSIS — R1314 Dysphagia, pharyngoesophageal phase: Secondary | ICD-10-CM

## 2014-03-04 NOTE — Patient Instructions (Signed)
You have been scheduled for an endoscopy. Please follow written instructions given to you at your visit today. If you use inhalers (even only as needed), please bring them with you on the day of your procedure. Your physician has requested that you go to www.startemmi.com and enter the access code given to you at your visit today. This web site gives a general overview about your procedure. However, you should still follow specific instructions given to you by our office regarding your preparation for the procedure.  We will call you with the breast tmilk instructions regarding the propofol sedation.

## 2014-03-04 NOTE — Telephone Encounter (Signed)
Patient will come in and see Tye Savoy RNP today at 3;00

## 2014-03-04 NOTE — Progress Notes (Signed)
     History of Present Illness:   Patient is a 43 year old female known to Dr. Carlean Purl. She saw him May 2014 for evaluation of dysphagia. Her symptoms improved on PPI therapy but never totally resolved. She is now having problems with both solids and liquids. When something gets stuck in her esophagus patient leans forward at which time she regurgitates mucus. Following that, the esophageal contents pass. She has never actually regurgitated food. Episodes of dysphagia are happening almost on a daily basis. She has been off PPIs since 2014 because of pregnancy and breast-feeding. She has no reflux symptoms such as heartburn.  Current Medications, Allergies, Past Medical History, Past Surgical History, Family History and Social History were reviewed in [employer] record.  Physical Exam: General: Pleasant, well developed , white female in no acute distress Head: Normocephalic and atraumatic Eyes:  sclerae anicteric, conjunctiva pink  Ears: Normal auditory acuity Lungs: Clear throughout to auscultation Heart: Regular rate and rhythm Abdomen: Soft, non distended, non-tender. No masses, no hepatomegaly. Normal bowel sounds Musculoskeletal: Symmetrical with no gross deformities  Extremities: No edema  Neurological: Alert oriented x 4, grossly nonfocal Psychological:  Alert and cooperative. Normal mood and affect  Assessment and Recommendations:   67. 43 year old female with chronic dysphagia, worse lately and occuring on daily basis. She mainly has problems with solids but liquids can be problematic as well. She stopped PPI in 2014 secondary to pregnancy / breastfeeding. Rule out stricture. Also consider eosinophilic esophagitis (EE) Of note, patient has asthma and uses fluticasone inhaler which is sometimes used to treat EE. For further evaluation patient will be scheduled with EGD. The benefits, risks, and potential complications of EGD with possible biopsies and/or  dilation were discussed with the patient and she agrees to proceed. Post sedation she will probably need to avoid breast feeding for several hours.  2. Hx of thyroid cancer, s/p thyroidectomy

## 2014-03-07 NOTE — Progress Notes (Signed)
Agree with Ms. Guenther's assessment and plan. Cyrene Gharibian E. Nicholson Starace, MD, FACG   

## 2014-03-11 ENCOUNTER — Encounter: Payer: Self-pay | Admitting: Nurse Practitioner

## 2014-03-15 ENCOUNTER — Ambulatory Visit (AMBULATORY_SURGERY_CENTER): Payer: Medicaid Other | Admitting: Internal Medicine

## 2014-03-15 ENCOUNTER — Encounter: Payer: Self-pay | Admitting: Internal Medicine

## 2014-03-15 VITALS — BP 101/78 | HR 62 | Temp 98.2°F | Resp 16 | Ht 64.0 in | Wt 217.0 lb

## 2014-03-15 DIAGNOSIS — K222 Esophageal obstruction: Secondary | ICD-10-CM

## 2014-03-15 DIAGNOSIS — R1319 Other dysphagia: Secondary | ICD-10-CM

## 2014-03-15 DIAGNOSIS — R1314 Dysphagia, pharyngoesophageal phase: Secondary | ICD-10-CM

## 2014-03-15 DIAGNOSIS — R1310 Dysphagia, unspecified: Secondary | ICD-10-CM

## 2014-03-15 DIAGNOSIS — K2210 Ulcer of esophagus without bleeding: Secondary | ICD-10-CM

## 2014-03-15 MED ORDER — SODIUM CHLORIDE 0.9 % IV SOLN: 500.0000 mL | INTRAVENOUS | Status: DC

## 2014-03-15 MED ORDER — LANSOPRAZOLE 30 MG PO CPDR: 30.0000 mg | DELAYED_RELEASE_CAPSULE | Freq: Every day | ORAL | Status: DC

## 2014-03-15 NOTE — Progress Notes (Signed)
Pt self administers 2 MDI of albuterol sulfate.

## 2014-03-15 NOTE — Progress Notes (Signed)
Procedure ends, to recovery, treport given and VSS. 

## 2014-03-15 NOTE — Progress Notes (Signed)
Called to room to assist during endoscopic procedure.  Patient ID and intended procedure confirmed with present staff. Received instructions for my participation in the procedure from the performing physician.  

## 2014-03-15 NOTE — Op Note (Addendum)
[address]  [address] [place], [id number]   ENDOSCOPY PROCEDURE REPORT  PATIENT: Linda Ware, Linda Ware  MR#: [id number] BIRTHDATE: 1970-12-17 , 42  yrs. old GENDER: female ENDOSCOPIST: Gatha Mayer, MD, [facility] PROCEDURE DATE:  03/15/2014 PROCEDURE:   EGD with biopsy and EGD with balloon dilatation ASA CLASS:   Class II INDICATIONS:dysphagia. MEDICATIONS: Propofol 300 mg IV and Monitored anesthesia care TOPICAL ANESTHETIC:   none  DESCRIPTION OF PROCEDURE:   After the risks benefits and alternatives of the procedure were thoroughly explained, informed consent was obtained.  The LB [id number]  endoscope was introduced through the mouth  and advanced to the second portion of the duodenum ,      The instrument was slowly withdrawn as the mucosa was carefully examined.    ESOPHAGUS: A large clean-based and irregular shaped ulcer was found at the gastroesophageal junction.  Biopsies were taken around the ulcer, at the center of the ulcer and at edge of the ulcer.   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 The stenosis was traversable with the endoscope.   The remainder of the upper endoscopy exam was otherwise normal.  Dilation was then performed at the gastroesphageal junction  Dilator:Balloon Size:18  Reststance:minimal Heme:yes Appearance:adequate  COMPLICATIONS: There were no immediate complications.  ENDOSCOPIC IMPRESSION: 1.   Large ulcer was found at the gastroesophageal junction - biopsies taken 2.   Stricture was found at the gastroesophageal junction - associated with ulcer, dilated to 18 mm 3.   The remainder of the upper endoscopy exam was otherwise normal.   RECOMMENDATIONS: 1.  Clear liquids until 530 PM, then soft foods rest of day.  Resume prior diet tomorrow. 2.  PPI qam lansoprazole 30 mg every AM 3.  Office will call with results  eSigned:  Gatha Mayer, MD, [facility] 03/15/2014 4:20 PM Revised:  03/15/2014 4:20 PM CC: the Patient      PATIENT NAME:  [name], [name] MR#: [id number]

## 2014-03-15 NOTE — Patient Instructions (Signed)
YOU HAD AN ENDOSCOPIC PROCEDURE TODAY AT THE  ENDOSCOPY CENTER: Refer to the procedure report that was given to you for any specific questions about what was found during the examination.  If the procedure report does not answer your questions, please call your gastroenterologist to clarify.  If you requested that your care partner not be given the details of your procedure findings, then the procedure report has been included in a sealed envelope for you to review at your convenience later.  YOU SHOULD EXPECT: Some feelings of bloating in the abdomen. Passage of more gas than usual.  Walking can help get rid of the air that was put into your GI tract during the procedure and reduce the bloating. If you had a lower endoscopy (such as a colonoscopy or flexible sigmoidoscopy) you may notice spotting of blood in your stool or on the toilet paper. If you underwent a bowel prep for your procedure, then you may not have a normal bowel movement for a few days.  DIET: Your first meal following the procedure should be a light meal and then it is ok to progress to your normal diet.  A half-sandwich or bowl of soup is an example of a good first meal.  Heavy or fried foods are harder to digest and may make you feel nauseous or bloated.  Likewise meals heavy in dairy and vegetables can cause extra gas to form and this can also increase the bloating.  Drink plenty of fluids but you should avoid alcoholic beverages for 24 hours.  ACTIVITY: Your care partner should take you home directly after the procedure.  You should plan to take it easy, moving slowly for the rest of the day.  You can resume normal activity the day after the procedure however you should NOT DRIVE or use heavy machinery for 24 hours (because of the sedation medicines used during the test).    SYMPTOMS TO REPORT IMMEDIATELY: A gastroenterologist can be reached at any hour.  During normal business hours, 8:30 AM to 5:00 PM Monday through Friday,  call [phone].  After hours and on weekends, please call the GI answering service at [phone] who will take a message and have the physician on call contact you.    Following upper endoscopy (EGD)  Vomiting of blood or coffee ground material  New chest pain or pain under the shoulder blades  Painful or persistently difficult swallowing  New shortness of breath  Fever of 100F or higher  Black, tarry-looking stools  FOLLOW UP: If any biopsies were taken you will be contacted by phone or by letter within the next 1-3 weeks.  Call your gastroenterologist if you have not heard about the biopsies in 3 weeks.  Our staff will call the home number listed on your records the next business day following your procedure to check on you and address any questions or concerns that you may have at that time regarding the information given to you following your procedure. This is a courtesy call and so if there is no answer at the home number and we have not heard from you through the emergency physician on call, we will assume that you have returned to your regular daily activities without incident.  SIGNATURES/CONFIDENTIALITY: You and/or your care partner have signed paperwork which will be entered into your electronic medical record.  These signatures attest to the fact that that the information above on your After Visit Summary has been reviewed and is understood.  Full   responsibility of the confidentiality of this discharge information lies with you and/or your care-partner.  Recommendations Clear liquids until 5:30pm, then soft foods the rest of the day. Await pathology results. Prescription for Lansoprazole 30 mg every morning. Dilation diet handout/intstructions provided to patient/care provider.

## 2014-03-18 ENCOUNTER — Telehealth: Payer: Self-pay | Admitting: *Deleted

## 2014-03-18 NOTE — Telephone Encounter (Signed)
  Follow up Call-  Call back number 03/15/2014  Post procedure Call Back phone  # [phone]  Permission to leave phone message Yes     Patient questions:  Do you have a fever, pain , or abdominal swelling? No. Pain Score  0 *  Have you tolerated food without any problems? Yes.    Have you been able to return to your normal activities? Yes.    Do you have any questions about your discharge instructions: Diet   Yes.   Medications  No. Follow up visit  No.  Do you have questions or concerns about your Care? No.  Just asked about H-pylori, and I  Told her that she would get a letter explaining the biopsies in about two weeks.  Actions: * If pain score is 4 or above:0 No action needed, pain <4.

## 2014-03-21 NOTE — Progress Notes (Signed)
Quick Note:  Ulcer - benign GERD is dx Stay on PPI Notify by My Chart - no letter and no recall ______

## 2014-04-02 ENCOUNTER — Telehealth: Payer: Self-pay | Admitting: Internal Medicine

## 2014-04-02 NOTE — Telephone Encounter (Signed)
Patient states that insurance company says they will cover lansoprazole with a prior authorization. PA phone number is [phone]

## 2014-04-02 NOTE — Telephone Encounter (Signed)
Called [place] [employer] and spoke with Linda Ware, got lansoprazole approved for one year, dx: GERD,  She has tried both the prilosec and generic version omeprazole.  Approval # is [id number] , approved for one year.  Linda Ware was informed, patient informed.

## 2014-04-02 NOTE — Telephone Encounter (Signed)
Spoke with patient and she is going to contact her insurance company again and see what do they cover and call me back.

## 2014-10-02 ENCOUNTER — Ambulatory Visit (INDEPENDENT_AMBULATORY_CARE_PROVIDER_SITE_OTHER): Payer: Medicaid Other | Admitting: Neurology

## 2014-10-02 ENCOUNTER — Encounter: Payer: Self-pay | Admitting: Neurology

## 2014-10-02 VITALS — BP 122/80 | HR 78 | Resp 18 | Ht 64.96 in | Wt 223.0 lb

## 2014-10-02 DIAGNOSIS — G4710 Hypersomnia, unspecified: Secondary | ICD-10-CM | POA: Insufficient documentation

## 2014-10-02 DIAGNOSIS — G4730 Sleep apnea, unspecified: Secondary | ICD-10-CM

## 2014-10-02 NOTE — Progress Notes (Signed)
[place]   Provider:  Larey Seat, M D  Referring Provider: Mingo Amber, MD Primary Care Physician:  Pcp Not In System  Chief Complaint  Patient presents with  . sleep consult    already on cpap, excessive daytime sleepiness, rm 10, alone      HPI:  Linda Ware is a 44 y.o. female ,  seen here 10-02-14 as a referral  from Dr. Jaynie Collins for CPAP follow-up.   [name] has been followed by Dr. Kristen Cardinal for several years.  She was first diagnosed in 2008 or 09 , She underwent the last  sleep study at Dr. Nettie [facility]m [facility] lab in 2011- this was the last of several sleep studies.  Just recently,  on 4-20 8-16, she saw Dr. Moshe Salisbury again and complained that she has had increasing fatigue she feels tired all the time and she has gained weight instead of losing it. She continues to use her CPAP on a regular basis but she wondered if the settings were still serving her well. She has not had a re-titration for evaluation in many years now. At a certain point in her life she was able to stop using CPAP after she lost about 50 pounds but she has gained it all back with her last pregnancy -this was in 2014. She was seen by the nutritionist at [facility] , but this is not longer covered.   She is the mother of 42, her youngest is 55 month old. The patient tries to go to bed by about 9 PM and then place on her [place] from usually solitaire for about 30 minutes before she can enter sleep. He wears his CPAP and is usually out within seconds. She will sleep through the night except when woken by the baby over the last months she developed a pattern of waking up at 2 am and will take about 30 minutes to go back to sleep. She has usually no nocturia lately she has woken up with some headaches but she does not report a dry mouth. The baby wakes up at about 5 AM slit leaves her with less than 7 hours of sleep time. Takes 2-3 naps in daytime. Usually these are Naps of 10 or 15  minute in duration. She reports no dreams or vivid dreams in this short nap time, he does report vivid dreaming at night. But she cannot report sleep paralysis, dream intrusion or sleep hallucinations. No Sleepwalking.    The patient brought her CPAP machine which allowed [place] a download she's using a C-Flex machine and she used it for 96% of the last 30 days ( 10-01-14) with a average daily use at time of 6 hours and 54 minutes. The time of use for over 4 hours was 96.7% there is no AHI available from this device.     Review of Systems: Out of a complete 14 system review, the patient complains of only the following symptoms, and all other reviewed systems are negative.  fatigue, snoring,   Epworth score 16 , Fatigue severity score 53  , depression score 3   History   Social History  . Marital Status: Married    Spouse Name: N/A  . Number of Children: 4  . Years of Education: N/A   Occupational History  . waitress, nanny, homeschool teacher,etc    Social History Main Topics  . Smoking status: Former Smoker    Types: Cigarettes    Quit date: 05/10/1993  . Smokeless tobacco:  Never Used  . Alcohol Use: No     Comment: occasional  . Drug Use: No  . Sexual Activity: Yes   Other Topics Concern  . Not on file   Social History Narrative   Denies caffeine use except the occasional [employer].    Family History  Problem Relation Age of Onset  . Asthma Mother   . Asthma Maternal Grandmother   . Dementia Maternal Grandmother   . Dementia Maternal Grandfather   . Melanoma Maternal Grandfather   . Breast cancer Paternal Grandmother   . Colon cancer Cousin   . Heart attack Maternal Grandfather   . Heart attack Paternal Uncle   . Diabetes type II Maternal Grandfather   . Diabetes type II Paternal Aunt   . Diabetes type II Cousin   . Diabetes type II Paternal Uncle   . Hyperlipidemia Mother   . Hyperlipidemia Paternal Grandmother   . Hyperlipidemia Paternal Grandfather   .  Hypertension Maternal Grandmother   . Hypertension Paternal Grandfather   . Hypertension Paternal Uncle   . Hypertension Paternal Aunt   . Kidney disease    . Stroke Maternal Uncle   . Hyperthyroidism Father   . Hypothyroidism Paternal Grandmother   . Hypothyroidism Paternal Grandfather   . Celiac disease Sister     questionable  . Multiple myeloma Maternal Grandmother   . Pancreatic cancer Paternal Grandfather   . Inflammatory bowel disease Paternal Grandmother     Past Medical History  Diagnosis Date  . Iron deficiency anemia   . Alcohol abuse     in college, none in years  . 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 GERD (gastroesophageal reflux disease)   . Kidney stone   . Sleep apnea     intermittent  . Obesity   . Asthma, allergic   . Rosacea   . Plantar fasciitis   . Pregnant     [redacted] weeks    Past Surgical History  Procedure Laterality Date  . Thyroidectomy  2007  . Hernia repair    . Esophagogastroduodenoscopy  02/19/2009    Dr. Jill Side  . Kidney stone removal      x multiple occasions  . Tonsillectomy      Current Outpatient Prescriptions  Medication Sig Dispense Refill  . albuterol (PROVENTIL HFA;VENTOLIN HFA) 108 (90 BASE) MCG/ACT inhaler Inhale 2 puffs into the lungs every 4 (four) hours as needed for wheezing or shortness of breath. 1 Inhaler 5  . ARMOUR THYROID 60 MG tablet Take 90-120 mg by mouth 2 (two) times daily. 2 tabs in am and 1.5 tabs in evening    . Ascorbic Acid (VITAMIN C) 250 MG CHEW Chew 250 mg by mouth daily.     . Fluticasone-Salmeterol (ADVAIR DISKUS) 250-50 MCG/DOSE AEPB Inhale 1 puff into the lungs 2 (two) times daily. 1 each 3  . MAGNESIUM PO Take 1 tablet by mouth daily.     No current facility-administered medications for this visit.    Allergies as of 10/02/2014 - Review Complete 10/02/2014  Allergen Reaction Noted  . Clindamycin/lincomycin Rash 10/07/2011    Vitals: BP 122/80 mmHg  Pulse 78  Resp 18  Ht  5' 4.96" (1.65 m)  Wt 223 lb (101.152 kg)  BMI 37.15 kg/m2 Last Weight:  Wt Readings from Last 1 Encounters:  10/02/14 223 lb (101.152 kg)       Last Height:   Ht Readings from Last 1 Encounters:  10/02/14 5' 4.96" (1.65  m)    Physical exam:  General: The patient is awake, alert and appears not in acute distress. The patient is well groomed. Head: Normocephalic, atraumatic. Neck is supple. Mallampati 3   neck circumference: 16 . Nasal airflow  Unrestricted , TMJ is evident . Retrognathia is  seen.  Cardiovascular:  Regular rate and rhythm , without  murmurs or carotid bruit, and without distended neck veins. Respiratory: Lungs are clear to auscultation. Skin:  Without evidence of edema, or rash Trunk: BMI is elevated and patient  has normal posture.  Neurologic exam : The patient is awake and alert, oriented to place and time.   Memory subjective described as intact. There is a normal attention span & concentration ability.  Speech is fluent without dysarthria, dysphonia or aphasia. Mood and affect are appropriate.  Cranial nerves: Pupils are equal and briskly reactive to light. Funduscopic exam without  evidence of pallor or edema.  Extraocular movements  in vertical and horizontal planes intact and without nystagmus. Visual fields by finger perimetry are intact. Hearing to finger rub intact.  Facial sensation intact to fine touch. Facial motor strength is symmetric and tongue and uvula move midline. Motor exam: Normal tone, muscle bulk and symmetric, strength in all extremities. Sensory:  Fine touch, pinprick and vibration were tested in all extremities. Proprioception is normal. Coordination: Rapid alternating movements in the fingers/hands is normal. Finger-to-nose maneuver without evidence of ataxia, dysmetria or tremor. Gait and station: Patient walks without assistive device and is able unassisted to climb up to the exam table.  Strength within normal limits. Stance is stable  and normal. Tandem gait is unfragmented. Romberg testing is negative. Deep tendon reflexes: in the  upper and lower extremities are symmetric and intact.  Babinski maneuver response is downgoing.   Assessment:  After physical and neurologic examination, review of laboratory studies, imaging, neurophysiology testing and pre-existing records, assessment is  1) OSA , with compliant use of CPAP. Unfortunately, no AHI can be extracted. 2) obesity - BMI is elevated.  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    The patient was advised of the nature of the diagnosed sleep disorder , the treatment options and risks for general a health and wellness arising from not treating the condition. Visit duration was 40 minutes.  Plan:   Treatment plan and additional workup :  order a re- titration study, schedule as a SPLIT study to evaluate degree of a OSA.  RV after sleep study.     Asencion Partridge June Rode MD  10/02/2014

## 2014-10-28 ENCOUNTER — Ambulatory Visit (INDEPENDENT_AMBULATORY_CARE_PROVIDER_SITE_OTHER): Payer: Medicaid Other | Admitting: Neurology

## 2014-10-28 DIAGNOSIS — G4730 Sleep apnea, unspecified: Secondary | ICD-10-CM | POA: Diagnosis not present

## 2014-10-28 DIAGNOSIS — G4710 Hypersomnia, unspecified: Secondary | ICD-10-CM | POA: Diagnosis not present

## 2014-10-29 NOTE — Sleep Study (Signed)
See scanned documents in Encounters tab

## 2014-11-04 ENCOUNTER — Telehealth: Payer: Self-pay

## 2014-11-04 NOTE — Telephone Encounter (Signed)
Called pt to give sleep study results. No answer, left message on cell phone asking her to call me back.

## 2014-11-04 NOTE — Telephone Encounter (Signed)
Spoke to pt re: sleep study results. Informed her that her study showed no apnea but did show insomnia because she was unable to go back to sleep after 2 am. Advised pt to sleep on side since snoring was noted in the supine position. Advised her to lose weight, diet and exercise, and to not operate machinery when sleepy. Informed her that Dr. Brett Fairy recommended a sleep psychology referral and for the pt to return for an MSLT afterwards. Pt declined both, saying her insomnia was a "one time thing". Pt said she will call us back if she changes her mind. Pt wants this sleep study faxed to Dr. Jaynie Collins.

## 2014-12-30 ENCOUNTER — Telehealth: Payer: Self-pay | Admitting: Internal Medicine

## 2014-12-30 NOTE — Telephone Encounter (Signed)
Patient notified that it was prevacid.  All questions answered

## 2015-01-15 ENCOUNTER — Telehealth: Payer: Self-pay | Admitting: Obstetrics and Gynecology

## 2015-01-15 NOTE — Telephone Encounter (Signed)
PT CALLED AND HAS APPT TOMORROW WITH DR CHERRY FOR PESSARY CHECK, SHE STARTED HER PERIOD TODAY AND SHE STATED IT WILL BE HEAVY TOMORROW AND WANTED TO KNOW IF SHE NEEDS TO RESCHEDULE.

## 2015-01-15 NOTE — Telephone Encounter (Signed)
Pt to make an appt for pessary check week after next when Dr. Marcelline Mates in office unless a problem develops. Was to be seen for 6 month f/u from 07/19/2014. Pt in agreement with plan.

## 2015-01-16 ENCOUNTER — Ambulatory Visit: Payer: Self-pay | Admitting: Obstetrics and Gynecology

## 2015-01-29 ENCOUNTER — Encounter: Payer: Self-pay | Admitting: Obstetrics and Gynecology

## 2015-01-29 ENCOUNTER — Ambulatory Visit (INDEPENDENT_AMBULATORY_CARE_PROVIDER_SITE_OTHER): Payer: Medicaid Other | Admitting: Obstetrics and Gynecology

## 2015-01-29 VITALS — BP 110/63 | HR 68 | Ht 64.0 in | Wt 206.0 lb

## 2015-01-29 DIAGNOSIS — Z4689 Encounter for fitting and adjustment of other specified devices: Secondary | ICD-10-CM

## 2015-01-29 DIAGNOSIS — N812 Incomplete uterovaginal prolapse: Secondary | ICD-10-CM | POA: Diagnosis not present

## 2015-01-29 NOTE — Progress Notes (Signed)
GYNECOLOGY PROGRESS NOTE  Subjective:    Patient ID: Linda Ware, female    DOB: 06-27-1970, 44 y.o.   MRN: [id number]  HPI  Patient is a 44 y.o. [id number] female who presents for a pessary check. She reports no vaginal bleeding or discharge. She denies pelvic discomfort and difficulty urinating or moving her bowels.  The following portions of the patient's history were reviewed and updated as appropriate: allergies, current medications, past family history, past medical history, past social history, past surgical history and problem list.  Review of Systems A comprehensive review of systems was negative.   Objective:   Blood pressure 110/63, pulse 68, height 5\' 4"  (1.626 m), weight 206 lb (93.441 kg), last menstrual period 01/15/2015, currently breastfeeding. General appearance: alert and no distress Pelvic: The patient's Size 5 ring with support pessary was removed, cleaned and replaced without complications. Speculum examination revealed normal vaginal mucosa with no lesions or lacerations.   Assessment:   Cystocele, rectocele with incomplete uterine prolapse Pessary in situ  Plan:  ? The patient should return in 6 months for a pessary check.  Last pap smear in 2014 (not 2013 as noted in [employer]) during last pregnancy.  Will need pap sometime next year.    Rubie Maid, MD [facility]

## 2015-04-08 ENCOUNTER — Encounter (HOSPITAL_COMMUNITY): Payer: Self-pay | Admitting: Emergency Medicine

## 2015-04-08 ENCOUNTER — Emergency Department (HOSPITAL_COMMUNITY)
Admission: EM | Admit: 2015-04-08 | Discharge: 2015-04-08 | Disposition: A | Discharge status: Home / Self Care | Payer: Medicaid Other | Source: Home / Self Care | Attending: Emergency Medicine | Admitting: Emergency Medicine

## 2015-04-08 DIAGNOSIS — J45909 Unspecified asthma, uncomplicated: Secondary | ICD-10-CM | POA: Diagnosis not present

## 2015-04-08 DIAGNOSIS — J3489 Other specified disorders of nose and nasal sinuses: Secondary | ICD-10-CM | POA: Diagnosis not present

## 2015-04-08 LAB — POCT RAPID STREP A: Streptococcus, Group A Screen (Direct): NEGATIVE

## 2015-04-08 MED ORDER — PREDNISONE 20 MG PO TABS: ORAL_TABLET | ORAL | Status: DC

## 2015-04-08 NOTE — Discharge Instructions (Signed)
Asthma, Adult  start in any histamines such as Zyrtec, Allegra or Claritin. Short course of prednisone taper dose. Drink any fluids and stay well-hydrated.  Asthma is a recurring condition in which the airways tighten and narrow. Asthma can make it difficult to breathe. It can cause coughing, wheezing, and shortness of breath. Asthma episodes, also called asthma attacks, range from minor to life-threatening. Asthma cannot be cured, but medicines and lifestyle changes can help control it. CAUSES Asthma is believed to be caused by inherited (genetic) and environmental factors, but its exact cause is unknown. Asthma may be triggered by allergens, lung infections, or irritants in the air. Asthma triggers are different for each person. Common triggers include:   Animal dander.  Dust mites.  Cockroaches.  Pollen from trees or grass.  Mold.  Smoke.  Air pollutants such as dust, household cleaners, hair sprays, aerosol sprays, paint fumes, strong chemicals, or strong odors.  Cold air, weather changes, and winds (which increase molds and pollens in the air).  Strong emotional expressions such as crying or laughing hard.  Stress.  Certain medicines (such as aspirin) or types of drugs (such as beta-blockers).  Sulfites in foods and drinks. Foods and drinks that may contain sulfites include dried fruit, potato chips, and sparkling grape juice.  Infections or inflammatory conditions such as the flu, a cold, or an inflammation of the nasal membranes (rhinitis).  Gastroesophageal reflux disease (GERD).  Exercise or strenuous activity. SYMPTOMS Symptoms may occur immediately after asthma is triggered or many hours later. Symptoms include:  Wheezing.  Excessive nighttime or early morning coughing.  Frequent or severe coughing with a common cold.  Chest tightness.  Shortness of breath. DIAGNOSIS  The diagnosis of asthma is made by a review of your medical history and a physical exam.  Tests may also be performed. These may include:  Lung function studies. These tests show how much air you breathe in and out.  Allergy tests.  Imaging tests such as X-rays. TREATMENT  Asthma cannot be cured, but it can usually be controlled. Treatment involves identifying and avoiding your asthma triggers. It also involves medicines. There are 2 classes of medicine used for asthma treatment:   Controller medicines. These prevent asthma symptoms from occurring. They are usually taken every day.  Reliever or rescue medicines. These quickly relieve asthma symptoms. They are used as needed and provide short-term relief. Your health care provider will help you create an asthma action plan. An asthma action plan is a written plan for managing and treating your asthma attacks. It includes a list of your asthma triggers and how they may be avoided. It also includes information on when medicines should be taken and when their dosage should be changed. An action plan may also involve the use of a device called a peak flow meter. A peak flow meter measures how well the lungs are working. It helps you monitor your condition. HOME CARE INSTRUCTIONS   Take medicines only as directed by your health care provider. Speak with your health care provider if you have questions about how or when to take the medicines.  Use a peak flow meter as directed by your health care provider. Record and keep track of readings.  Understand and use the action plan to help minimize or stop an asthma attack without needing to seek medical care.  Control your home environment in the following ways to help prevent asthma attacks:  Do not smoke. Avoid being exposed to secondhand smoke.  Change  your heating and air conditioning filter regularly.  Limit your use of fireplaces and wood stoves.  Get rid of pests (such as roaches and mice) and their droppings.  Throw away plants if you see mold on them.  Clean your floors and  dust regularly. Use unscented cleaning products.  Try to have someone else vacuum for you regularly. Stay out of rooms while they are being vacuumed and for a short while afterward. If you vacuum, use a dust mask from a hardware store, a double-layered or microfilter vacuum cleaner bag, or a vacuum cleaner with a HEPA filter.  Replace carpet with wood, tile, or vinyl flooring. Carpet can trap dander and dust.  Use allergy-proof pillows, mattress covers, and box spring covers.  Wash bed sheets and blankets every week in hot water and dry them in a dryer.  Use blankets that are made of polyester or cotton.  Clean bathrooms and kitchens with bleach. If possible, have someone repaint the walls in these rooms with mold-resistant paint. Keep out of the rooms that are being cleaned and painted.  Wash hands frequently. SEEK MEDICAL CARE IF:   You have wheezing, shortness of breath, or a cough even if taking medicine to prevent attacks.  The colored mucus you cough up (sputum) is thicker than usual.  Your sputum changes from clear or white to yellow, green, gray, or bloody.  You have any problems that may be related to the medicines you are taking (such as a rash, itching, swelling, or trouble breathing).  You are using a reliever medicine more than 2-3 times per week.  Your peak flow is still at 50-79% of your personal best after following your action plan for 1 hour.  You have a fever. SEEK IMMEDIATE MEDICAL CARE IF:   You seem to be getting worse and are unresponsive to treatment during an asthma attack.  You are short of breath even at rest.  You get short of breath when doing very little physical activity.  You have difficulty eating, drinking, or talking due to asthma symptoms.  You develop chest pain.  You develop a fast heartbeat.  You have a bluish color to your lips or fingernails.  You are light-headed, dizzy, or faint.  Your peak flow is less than 50% of your  personal best.   This information is not intended to replace advice given to you by your health care provider. Make sure you discuss any questions you have with your health care provider.   Document Released: 04/26/2005 Document Revised: 01/15/2015 Document Reviewed: 11/23/2012 Elsevier Interactive Patient Education 2016 [employer]  Bronchospasm, Adult A bronchospasm is a spasm or tightening of the airways going into the lungs. During a bronchospasm breathing becomes more difficult because the airways get smaller. When this happens there can be coughing, a whistling sound when breathing (wheezing), and difficulty breathing. Bronchospasm is often associated with asthma, but not all patients who experience a bronchospasm have asthma. CAUSES  A bronchospasm is caused by inflammation or irritation of the airways. The inflammation or irritation may be triggered by:   Allergies (such as to animals, pollen, food, or mold). Allergens that cause bronchospasm may cause wheezing immediately after exposure or many hours later.   Infection. Viral infections are believed to be the most common cause of bronchospasm.   Exercise.   Irritants (such as pollution, cigarette smoke, strong odors, aerosol sprays, and paint fumes).   Weather changes. Winds increase molds and pollens in the air. Rain refreshes the  air by washing irritants out. Cold air may cause inflammation.   Stress and emotional upset.  SIGNS AND SYMPTOMS   Wheezing.   Excessive nighttime coughing.   Frequent or severe coughing with a simple cold.   Chest tightness.   Shortness of breath.  DIAGNOSIS  Bronchospasm is usually diagnosed through a history and physical exam. Tests, such as chest X-rays, are sometimes done to look for other conditions. TREATMENT   Inhaled medicines can be given to open up your airways and help you breathe. The medicines can be given using either an inhaler or a nebulizer  machine.  Corticosteroid medicines may be given for severe bronchospasm, usually when it is associated with asthma. HOME CARE INSTRUCTIONS   Always have a plan prepared for seeking medical care. Know when to call your health care provider and local emergency services (911 in the [place] Know where you can access local emergency care.  Only take medicines as directed by your health care provider.  If you were prescribed an inhaler or nebulizer machine, ask your health care provider to explain how to use it correctly. Always use a spacer with your inhaler if you were given one.  It is necessary to remain calm during an attack. Try to relax and breathe more slowly.  Control your home environment in the following ways:   Change your heating and air conditioning filter at least once a month.   Limit your use of fireplaces and wood stoves.  Do not smoke and do not allow smoking in your home.   Avoid exposure to perfumes and fragrances.   Get rid of pests (such as roaches and mice) and their droppings.   Throw away plants if you see mold on them.   Keep your house clean and dust free.   Replace carpet with wood, tile, or vinyl flooring. Carpet can trap dander and dust.   Use allergy-proof pillows, mattress covers, and box spring covers.   Wash bed sheets and blankets every week in hot water and dry them in a dryer.   Use blankets that are made of polyester or cotton.   Wash hands frequently. SEEK MEDICAL CARE IF:   You have muscle aches.   You have chest pain.   The sputum changes from clear or white to yellow, green, gray, or bloody.   The sputum you cough up gets thicker.   There are problems that may be related to the medicine you are given, such as a rash, itching, swelling, or trouble breathing.  SEEK IMMEDIATE MEDICAL CARE IF:   You have worsening wheezing and coughing even after taking your prescribed medicines.   You have increased difficulty  breathing.   You develop severe chest pain. MAKE SURE YOU:   Understand these instructions.  Will watch your condition.  Will get help right away if you are not doing well or get worse.   This information is not intended to replace advice given to you by your health care provider. Make sure you discuss any questions you have with your health care provider.   Document Released: 04/29/2003 Document Revised: 05/17/2014 Document Reviewed: 10/16/2012 Elsevier Interactive Patient Education [employer].

## 2015-04-08 NOTE — ED Provider Notes (Signed)
CSN: [id number]     Arrival date & time 04/08/15  1924 History   First MD Initiated Contact with Patient 04/08/15 2034     Chief Complaint  Patient presents with  . URI   (Consider location/radiation/quality/duration/timing/severity/associated sxs/prior Treatment) HPI Comments: 44 year old female with minor sore throat, chest tightness, mild cough and questionable PND. She has a history of asthma. When she has an asthma flareup she feels the same tightness in her chest. She started using her albuterol today. She does not have to use it often. She states she does not have any trouble breathing now or does not feel like she is having an asthma attack. Denies fever, chills.  Patient is a 44 y.o. female presenting with URI.  URI Presenting symptoms: cough and sore throat   Presenting symptoms: no congestion, no fatigue, no fever and no rhinorrhea     Past Medical History  Diagnosis Date  . Iron deficiency anemia   . Alcohol abuse     in college, none in years  . 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 GERD (gastroesophageal reflux disease)   . Kidney stone   . Sleep apnea     intermittent  . Obesity   . Asthma, allergic   . Rosacea   . Plantar fasciitis   . Pregnant     [redacted] weeks  . Cystocele   . Rectocele   . Obesity (BMI 30-39.9) 04/24/2012  . 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 History of dysplastic nevus 05/17/2012    Seen yearly by [facility] for total body exam    Past Surgical History  Procedure Laterality Date  . Thyroidectomy  2007  . Hernia repair    . Esophagogastroduodenoscopy  02/19/2009    Dr. Jill Side  . Kidney stone removal      x multiple occasions  . Tonsillectomy     Family History  Problem Relation Age of Onset  . Asthma Mother   . Hyperlipidemia Mother   . Asthma Maternal Grandmother   . Dementia Maternal Grandmother   . Hypertension Maternal Grandmother   . Multiple myeloma Maternal  Grandmother   . Dementia Maternal Grandfather   . Melanoma Maternal Grandfather   . Heart attack Maternal Grandfather   . Diabetes type II Maternal Grandfather   . Breast cancer Paternal Grandmother   . Hyperlipidemia Paternal Grandmother   . Hypothyroidism Paternal Grandmother   . Inflammatory bowel disease Paternal Grandmother   . Colon cancer Cousin   . Heart attack Paternal Uncle   . Diabetes type II Paternal Aunt   . Diabetes type II Cousin   . Diabetes type II Paternal Uncle   . Hyperlipidemia Paternal Grandfather   . Hypertension Paternal Grandfather   . Hypothyroidism Paternal Grandfather   . Pancreatic cancer Paternal Grandfather   . Hypertension Paternal Uncle   . Hypertension Paternal Aunt   . Kidney disease    . Stroke Maternal Uncle   . Hyperthyroidism Father   . Celiac disease Sister     questionable  . Diabetes Neg Hx   . COPD Neg Hx   . Heart disease Neg Hx    Social History  Substance Use Topics  . Smoking status: Former Smoker    Types: Cigarettes    Quit date: 05/10/1993  . Smokeless tobacco: Never Used  . Alcohol Use: Yes     Comment: occasional   OB History    Gravida Para Term Preterm  AB TAB SAB Ectopic Multiple Living   _0 0 0 0 0 0 0 5     Review of Systems  Constitutional: Negative for fever, chills, activity change and fatigue.  HENT: Positive for sore throat. Negative for congestion, postnasal drip, rhinorrhea and trouble swallowing.   Respiratory: Positive for cough, chest tightness and shortness of breath.        She is currently denying these symptoms.  Gastrointestinal: Negative.   Skin: Negative.   Neurological: Negative.     Allergies  Clindamycin/lincomycin  Home Medications   Prior to Admission medications   Medication Sig Start Date End Date Taking? Authorizing Provider  ARMOUR THYROID 60 MG tablet Take 90-120 mg by mouth 2 (two) times daily. 2 tabs in am and 1.5 tabs in evening 09/06/11  Yes Historical Provider, MD   Fluticasone-Salmeterol (ADVAIR DISKUS) 250-50 MCG/DOSE AEPB Inhale 1 puff into the lungs 2 (two) times daily. 10/02/13  Yes Bryan R Hess, DO  albuterol (PROVENTIL HFA;VENTOLIN HFA) 108 (90 BASE) MCG/ACT inhaler Inhale 2 puffs into the lungs every 4 (four) hours as needed for wheezing or shortness of breath. 09/07/13   Nolon Rod, DO  Ascorbic Acid (VITAMIN C) 250 MG CHEW Chew 250 mg by mouth daily.     Historical Provider, MD  MAGNESIUM PO Take 1 tablet by mouth daily.    Historical Provider, MD  predniSONE (DELTASONE) 20 MG tablet Take 3 tabs po on first day, 2 tabs second day, 2 tabs third day, 1 tab fourth day, 1 tab 5th day. Take with food. 04/08/15   Janne Napoleon, NP   Meds Ordered and Administered this Visit  Medications - No data to display  BP 110/72 mmHg  Pulse 76  Temp(Src) 98.1 F (36.7 C) (Oral)  Resp 18  SpO2 98%  LMP 03/19/2015 No data found.   Physical Exam  Constitutional: She is oriented to person, place, and time. She appears well-developed and well-nourished. No distress.  HENT:  Mouth/Throat: No oropharyngeal exudate.  Bilateral TMs are normal Oropharynx with mild erythema, cobblestoning and moderate amount of frothy clear PND.  Eyes: Conjunctivae and EOM are normal.  Neck: Normal range of motion. Neck supple.  Cardiovascular: Normal rate and normal heart sounds.   Pulmonary/Chest: Effort normal and breath sounds normal. No respiratory distress. She has no wheezes. She has no rales.  Musculoskeletal: She exhibits no edema.  Neurological: She is alert and oriented to person, place, and time. She exhibits normal muscle tone.  Skin: Skin is warm and dry.  Psychiatric: She has a normal mood and affect.  Nursing note and vitals reviewed.   ED Course  Procedures (including critical care time)  Labs Review Labs Reviewed  POCT RAPID STREP A    Imaging Review No results found.   Visual Acuity Review  Right Eye Distance:   Left Eye Distance:   Bilateral  Distance:    Right Eye Near:   Left Eye Near:    Bilateral Near:         MDM   1. Sinus drainage   2. Asthma due to seasonal allergies    start in any histamines such as Zyrtec, Allegra or Claritin. Short course of prednisone taper dose. Drink any fluids and stay well-hydrated.      Janne Napoleon, NP 04/08/15 2113

## 2015-04-08 NOTE — ED Notes (Signed)
C/o cold sx onset Monday associated w/chest tightness, prod cough, runny nose, congestion and ST Using her rescue inhalers w/no relief A&O x4... No acute distress.

## 2015-04-11 LAB — CULTURE, GROUP A STREP: Strep A Culture: NEGATIVE

## 2015-07-29 ENCOUNTER — Ambulatory Visit: Payer: Medicaid Other | Admitting: Obstetrics and Gynecology

## 2015-08-19 ENCOUNTER — Ambulatory Visit: Payer: Medicaid Other | Admitting: Obstetrics and Gynecology

## 2015-08-20 ENCOUNTER — Ambulatory Visit (INDEPENDENT_AMBULATORY_CARE_PROVIDER_SITE_OTHER): Payer: Medicaid Other | Admitting: Obstetrics and Gynecology

## 2015-08-20 ENCOUNTER — Encounter: Payer: Self-pay | Admitting: Obstetrics and Gynecology

## 2015-08-20 VITALS — BP 111/62 | HR 74 | Ht 64.0 in | Wt 215.7 lb

## 2015-08-20 DIAGNOSIS — N393 Stress incontinence (female) (male): Secondary | ICD-10-CM | POA: Diagnosis not present

## 2015-08-20 DIAGNOSIS — Z9289 Personal history of other medical treatment: Secondary | ICD-10-CM

## 2015-08-20 DIAGNOSIS — Z3009 Encounter for other general counseling and advice on contraception: Secondary | ICD-10-CM

## 2015-08-20 DIAGNOSIS — N814 Uterovaginal prolapse, unspecified: Secondary | ICD-10-CM | POA: Diagnosis not present

## 2015-08-20 DIAGNOSIS — Z960 Presence of urogenital implants: Secondary | ICD-10-CM

## 2015-08-20 NOTE — Progress Notes (Signed)
GYNECOLOGY PROGRESS NOTE  Subjective:    Patient ID: Linda Ware, female    DOB: 04-Jun-1970, 45 y.o.   MRN: [id number]  HPI  Patient is a 45 y.o. [id number] female who presents for a pessary check. She reports no vaginal bleeding or discharge. She denies pelvic discomfort and difficulty urinating or moving her bowels.  Patient is now noting occasional episodes of urinary leaking with activities (i.e. Exercise, coughing).  Also notes respiratory infection 2 months ago where she had severe episodes of coughing, and experienced leakage then as well   Patient also desires to discuss contraception today.   The following portions of the patient's history were reviewed and updated as appropriate: allergies, current medications, past family history, past medical history, past social history, past surgical history and problem list.  Review of Systems A comprehensive review of systems was negative.   Objective:   Blood pressure 111/62, pulse 74, height 5\' 4"  (1.626 m), weight 215 lb 11.2 oz (97.841 kg), not currently breastfeeding. General appearance: alert and no distress Pelvic: The patient's Size 5 ring with support pessary was removed, cleaned and replaced without complications. Speculum examination revealed normal vaginal mucosa with no lesions or lacerations.   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 Discussed possibility of needing to be refitted for pessary, or changing to pessary with knob if leakage continues.  Emphasized Kegel exercises as first measure.  Will reassess at next visit.  Reviewed all forms of birth control options available including abstinence; over the counter/barrier methods; hormonal contraceptive medication including pill, patch, ring, injection,contraceptive implant; hormonal and nonhormonal IUDs; permanent sterilization options including vasectomy and  the various tubal sterilization modalities. Risks and benefits reviewed.  Questions were answered.  Information was given to patient to review.  Is considering Paraguard IUD.  To f/u in 2 weeks for placement if desired.  Patient to f/u in 6 weeks for annual exam. Last pap smear in 2014 (not 2013 as noted in [employer]) during last pregnancy.     A total of 15 minutes were spent face-to-face with the patient during this encounter and over half of that time dealt with counseling and coordination of care.   Rubie Maid, MD [facility]

## 2015-11-04 ENCOUNTER — Emergency Department: Payer: Medicaid Other

## 2015-11-04 ENCOUNTER — Emergency Department
Admission: EM | Admit: 2015-11-04 | Discharge: 2015-11-04 | Disposition: A | Discharge status: Home / Self Care | Payer: Medicaid Other | Attending: Emergency Medicine | Admitting: Emergency Medicine

## 2015-11-04 ENCOUNTER — Encounter: Payer: Self-pay | Admitting: Emergency Medicine

## 2015-11-04 DIAGNOSIS — Z5181 Encounter for therapeutic drug level monitoring: Secondary | ICD-10-CM | POA: Diagnosis not present

## 2015-11-04 DIAGNOSIS — H81399 Other peripheral vertigo, unspecified ear: Secondary | ICD-10-CM | POA: Diagnosis not present

## 2015-11-04 DIAGNOSIS — Z8719 Personal history of other diseases of the digestive system: Secondary | ICD-10-CM | POA: Insufficient documentation

## 2015-11-04 DIAGNOSIS — Z79899 Other long term (current) drug therapy: Secondary | ICD-10-CM | POA: Diagnosis not present

## 2015-11-04 DIAGNOSIS — J4520 Mild intermittent asthma, uncomplicated: Secondary | ICD-10-CM | POA: Insufficient documentation

## 2015-11-04 DIAGNOSIS — R110 Nausea: Secondary | ICD-10-CM | POA: Diagnosis not present

## 2015-11-04 DIAGNOSIS — Z85850 Personal history of malignant neoplasm of thyroid: Secondary | ICD-10-CM | POA: Insufficient documentation

## 2015-11-04 DIAGNOSIS — I639 Cerebral infarction, unspecified: Secondary | ICD-10-CM | POA: Diagnosis present

## 2015-11-04 DIAGNOSIS — Z87891 Personal history of nicotine dependence: Secondary | ICD-10-CM | POA: Diagnosis not present

## 2015-11-04 LAB — COMPREHENSIVE METABOLIC PANEL
ALT: 24 U/L (ref 14–54)
AST: 22 U/L (ref 15–41)
Albumin: 4.2 g/dL (ref 3.5–5.0)
Alkaline Phosphatase: 66 U/L (ref 38–126)
Anion gap: 9 (ref 5–15)
BUN: 13 mg/dL (ref 6–20)
CO2: 24 mmol/L (ref 22–32)
Calcium: 9.6 mg/dL (ref 8.9–10.3)
Chloride: 106 mmol/L (ref 101–111)
Creatinine, Ser: 0.71 mg/dL (ref 0.44–1.00)
GFR calc Af Amer: >60 mL/min (ref >60)
GFR calc non Af Amer: >60 mL/min (ref >60)
Glucose, Bld: 118 mg/dL — ABNORMAL HIGH (ref 65–99)
Potassium: 4 mmol/L (ref 3.5–5.1)
Sodium: 139 mmol/L (ref 135–145)
Total Bilirubin: 0.5 mg/dL (ref 0.3–1.2)
Total Protein: 7.5 g/dL (ref 6.5–8.1)

## 2015-11-04 LAB — DIFFERENTIAL
Basophils Absolute: 0.1 10*3/uL (ref 0–0.1)
Basophils Relative: 1 %
Eosinophils Absolute: 0.1 10*3/uL (ref 0–0.7)
Eosinophils Relative: 1 %
Lymphocytes Relative: 13 %
Lymphs Abs: 1.5 10*3/uL (ref 1.0–3.6)
Monocytes Absolute: 0.4 10*3/uL (ref 0.2–0.9)
Monocytes Relative: 3 %
Neutro Abs: 9.9 10*3/uL — ABNORMAL HIGH (ref 1.4–6.5)
Neutrophils Relative %: 82 %

## 2015-11-04 LAB — CBC
HCT: 37.6 % (ref 35.0–47.0)
Hemoglobin: 12 g/dL (ref 12.0–16.0)
MCH: 24.2 pg — ABNORMAL LOW (ref 26.0–34.0)
MCHC: 31.9 g/dL — ABNORMAL LOW (ref 32.0–36.0)
MCV: 76 fL — ABNORMAL LOW (ref 80.0–100.0)
Platelets: 272 10*3/uL (ref 150–440)
RBC: 4.95 MIL/uL (ref 3.80–5.20)
RDW: 14.5 % (ref 11.5–14.5)
WBC: 12.1 10*3/uL — ABNORMAL HIGH (ref 3.6–11.0)

## 2015-11-04 LAB — PROTIME-INR
INR: 0.99
Prothrombin Time: 13.3 seconds (ref 11.4–15.0)

## 2015-11-04 LAB — APTT: aPTT: 31 seconds (ref 24–36)

## 2015-11-04 LAB — TROPONIN I: Troponin I: <0.03 ng/mL (ref <0.03)

## 2015-11-04 MED ORDER — GADOBENATE DIMEGLUMINE 529 MG/ML IV SOLN
20.0000 mL | Freq: Once | INTRAVENOUS | Status: AC | PRN
  Administered 2015-11-04: 20 mL via INTRAVENOUS

## 2015-11-04 MED ORDER — MECLIZINE HCL 25 MG PO TABS
25.0000 mg | ORAL_TABLET | Freq: Once | ORAL | Status: AC
  Administered 2015-11-04: 25 mg via ORAL
  Filled 2015-11-04: qty 1

## 2015-11-04 MED ORDER — GI COCKTAIL ~~LOC~~
30.0000 mL | Freq: Once | ORAL | Status: AC
  Administered 2015-11-04: 30 mL via ORAL
  Filled 2015-11-04: qty 30

## 2015-11-04 MED ORDER — MECLIZINE HCL 12.5 MG PO TABS: 12.5000 mg | ORAL_TABLET | Freq: Three times a day (TID) | ORAL | Status: DC | PRN

## 2015-11-04 NOTE — ED Notes (Signed)
Code  Stroke  Called  To  [phone]

## 2015-11-04 NOTE — ED Notes (Signed)
Patient transported to MRI 

## 2015-11-04 NOTE — ED Notes (Signed)
Pt reports developing sore on tongue on Friday; reports feeling "off" all weekend. Pt today with sudden onset of dizziness at 1200. Reports persistent nausea.

## 2015-11-04 NOTE — Discharge Instructions (Signed)
Benign Positional Vertigo Vertigo is the feeling that you or your surroundings are moving when they are not. Benign positional vertigo is the most common form of vertigo. The cause of this condition is not serious (is benign). This condition is triggered by certain movements and positions (is positional). This condition can be dangerous if it occurs while you are doing something that could endanger you or others, such as driving.  CAUSES In many cases, the cause of this condition is not known. It may be caused by a disturbance in an area of the inner ear that helps your brain to sense movement and balance. This disturbance can be caused by a viral infection (labyrinthitis), head injury, or repetitive motion. RISK FACTORS This condition is more likely to develop in: 1. Women. 2. People who are 23 years of age or older. SYMPTOMS Symptoms of this condition usually happen when you move your head or your eyes in different directions. Symptoms may start suddenly, and they usually last for less than a minute. Symptoms may include:  Loss of balance and falling.  Feeling like you are spinning or moving.  Feeling like your surroundings are spinning or moving.  Nausea and vomiting.  Blurred vision.  Dizziness.  Involuntary eye movement (nystagmus). Symptoms can be mild and cause only slight annoyance, or they can be severe and interfere with daily life. Episodes of benign positional vertigo may return (recur) over time, and they may be triggered by certain movements. Symptoms may improve over time. DIAGNOSIS This condition is usually diagnosed by medical history and a physical exam of the head, neck, and ears. You may be referred to a health care provider who specializes in ear, nose, and throat (ENT) problems (otolaryngologist) or a provider who specializes in disorders of the nervous system (neurologist). You may have additional testing, including:  MRI.  A CT scan.  Eye movement tests. Your  health care provider may ask you to change positions quickly while he or she watches you for symptoms of benign positional vertigo, such as nystagmus. Eye movement may be tested with an electronystagmogram (ENG), caloric stimulation, the Dix-Hallpike test, or the roll test.  An electroencephalogram (EEG). This records electrical activity in your brain.  Hearing tests. TREATMENT Usually, your health care provider will treat this by moving your head in specific positions to adjust your inner ear back to normal. Surgery may be needed in severe cases, but this is rare. In some cases, benign positional vertigo may resolve on its own in 2-4 weeks. HOME CARE INSTRUCTIONS Safety  Move slowly.Avoid sudden body or head movements.  Avoid driving.  Avoid operating heavy machinery.  Avoid doing any tasks that would be dangerous to you or others if a vertigo episode would occur.  If you have trouble walking or keeping your balance, try using a cane for stability. If you feel dizzy or unstable, sit down right away.  Return to your normal activities as told by your health care provider. Ask your health care provider what activities are safe for you. General Instructions  Take over-the-counter and prescription medicines only as told by your health care provider.  Avoid certain positions or movements as told by your health care provider.  Drink enough fluid to keep your urine clear or pale yellow.  Keep all follow-up visits as told by your health care provider. This is important. SEEK MEDICAL CARE IF:  You have a fever.  Your condition gets worse or you develop new symptoms.  Your family or friends  notice any behavioral changes.  Your nausea or vomiting gets worse.  You have numbness or a "pins and needles" sensation. SEEK IMMEDIATE MEDICAL CARE IF:  You have difficulty speaking or moving.  You are always dizzy.  You faint.  You develop severe headaches.  You have weakness in your  legs or arms.  You have changes in your hearing or vision.  You develop a stiff neck.  You develop sensitivity to light.   This information is not intended to replace advice given to you by your health care provider. Make sure you discuss any questions you have with your health care provider.   Document Released: 02/01/2006 Document Revised: 01/15/2015 Document Reviewed: 08/19/2014 Elsevier Interactive Patient Education [employer].   Please return immediately if condition worsens. Please contact her primary physician or the physician you were given for referral. If you have any specialist physicians involved in her treatment and plan please also contact them. Thank you for using  regional emergency Department.  Epley Maneuver Self-Care WHAT IS THE EPLEY MANEUVER? The Epley maneuver is an exercise you can do to relieve symptoms of benign paroxysmal positional vertigo (BPPV). This condition is often just referred to as vertigo. BPPV is caused by the movement of tiny crystals (canaliths) inside your inner ear. The accumulation and movement of canaliths in your inner ear causes a sudden spinning sensation (vertigo) when you move your head to certain positions. Vertigo usually lasts about 30 seconds. BPPV usually occurs in just one ear. If you get vertigo when you lie on your left side, you probably have BPPV in your left ear. Your health care provider can tell you which ear is involved.  BPPV may be caused by a head injury. Many people older than 50 get BPPV for unknown reasons. If you have been diagnosed with BPPV, your health care provider may teach you how to do this maneuver. BPPV is not life threatening (benign) and usually goes away in time.  WHEN SHOULD I PERFORM THE EPLEY MANEUVER? You can do this maneuver at home whenever you have symptoms of vertigo. You may do the Epley maneuver up to 3 times a day until your symptoms of vertigo go away. HOW SHOULD I DO THE EPLEY  MANEUVER? 3. Sit on the edge of a bed or table with your back straight. Your legs should be extended or hanging over the edge of the bed or table.  4. Turn your head halfway toward the affected ear.  5. Lie backward quickly with your head turned until you are lying flat on your back. You may want to position a pillow under your shoulders.  6. Hold this position for 30 seconds. You may experience an attack of vertigo. This is normal. Hold this position until the vertigo stops. 7. Then turn your head to the opposite direction until your unaffected ear is facing the floor.  8. Hold this position for 30 seconds. You may experience an attack of vertigo. This is normal. Hold this position until the vertigo stops. 9. Now turn your whole body to the same side as your head. Hold for another 30 seconds.  10. You can then sit back up. ARE THERE RISKS TO THIS MANEUVER? In some cases, you may have other symptoms (such as changes in your vision, weakness, or numbness). If you have these symptoms, stop doing the maneuver and call your health care provider. Even if doing these maneuvers relieves your vertigo, you may still have dizziness. Dizziness is the sensation  of light-headedness but without the sensation of movement. Even though the Epley maneuver may relieve your vertigo, it is possible that your symptoms will return within 5 years. WHAT SHOULD I DO AFTER THIS MANEUVER? After doing the Epley maneuver, you can return to your normal activities. Ask your doctor if there is anything you should do at home to prevent vertigo. This may include:  Sleeping with two or more pillows to keep your head elevated.  Not sleeping on the side of your affected ear.  Getting up slowly from bed.  Avoiding sudden movements during the day.  Avoiding extreme head movement, like looking up or bending over.  Wearing a cervical collar to prevent sudden head movements. WHAT SHOULD I DO IF MY SYMPTOMS GET WORSE? Call your  health care provider if your vertigo gets worse. Call your provider right way if you have other symptoms, including:   Nausea.  Vomiting.  Headache.  Weakness.  Numbness.  Vision changes.   This information is not intended to replace advice given to you by your health care provider. Make sure you discuss any questions you have with your health care provider.   Document Released: 05/01/2013 Document Reviewed: 05/01/2013 Elsevier Interactive Patient Education [employer].

## 2015-11-04 NOTE — ED Notes (Signed)
MD at bedside. 

## 2015-11-04 NOTE — ED Notes (Signed)
Pt reports sudden onset of dizziness at 1200 today. Reports she is also part of a binge eating drug study, reports she does not know if she received placebo or not.no other deficits noted. Reports frequent HA recently

## 2015-11-05 NOTE — ED Provider Notes (Signed)
Time Seen: Approximately *1610 I have reviewed the triage notes  Chief Complaint: Code Stroke   History of Present Illness: Linda Ware is a 45 y.o. female who presents with sudden onset of dizziness that started at noon today. The patient states that she feels like she is moving at times but denies any room spinning. She denies any ear pain or deafness. 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 Patient's not sure if she is on the study drug or whether or not she is on the placebo. The patient denies any current nausea or vomiting though states she had some waves of nausea at home and this started. She states it's stable when she lies still and exacerbated by sitting upright or movement. Past Medical History  Diagnosis Date  . Iron deficiency anemia   . Alcohol abuse     in college, none in years  . 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 GERD (gastroesophageal reflux disease)   . Kidney stone   . Sleep apnea     intermittent  . Obesity   . Asthma, allergic   . Rosacea   . Plantar fasciitis   . Pregnant     [redacted] weeks  . Cystocele   . Rectocele   . Obesity (BMI 30-39.9) 04/24/2012  . 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 History of dysplastic nevus 05/17/2012    Seen yearly by [facility] for total body exam     Patient Active Problem List   Diagnosis Date Noted  . Stress incontinence 08/20/2015  . Vaginal pessary in situ 08/20/2015  . Cystocele with small rectocele and uterine descent 08/20/2015  . Cystocele or rectocele with incomplete uterine prolapse 01/29/2015  . Hypersomnia with sleep apnea 10/02/2014  . 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/2014  . 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 Allergic urticaria 12/25/2012  . GERD (gastroesophageal reflux disease) 08/10/2012  . Rosacea 05/22/2012  . History of dysplastic nevus 05/17/2012  . Venous insufficiency of leg 04/24/2012  . Obesity (BMI  30-39.9) 04/24/2012  . Plantar fasciitis of left foot 12/14/2011  . Rotator cuff syndrome of right shoulder 12/14/2011  . Hypothyroid 10/21/2011  . Asthma, mild intermittent, well-controlled 10/21/2011    Past Surgical History  Procedure Laterality Date  . Thyroidectomy  2007  . Hernia repair    . Esophagogastroduodenoscopy  02/19/2009    Dr. Jill Side  . Kidney stone removal      x multiple occasions  . Tonsillectomy      Past Surgical History  Procedure Laterality Date  . Thyroidectomy  2007  . Hernia repair    . Esophagogastroduodenoscopy  02/19/2009    Dr. Jill Side  . Kidney stone removal      x multiple occasions  . Tonsillectomy      Current Outpatient Rx  Name  Route  Sig  Dispense  Refill  . albuterol (PROVENTIL HFA;VENTOLIN HFA) 108 (90 BASE) MCG/ACT inhaler   Inhalation   Inhale 2 puffs into the lungs every 4 (four) hours as needed for wheezing or shortness of breath.   1 Inhaler   5     Please dispense proventil or generic if available  ...   . ARMOUR THYROID 60 MG tablet   Oral   Take 60-120 mg by mouth 2 (two) times daily. Pt takes two tablets in the morning and one tablet in the afternoon.         . Cholecalciferol (  VITAMIN D3) 5000 units TABS   Oral   Take 10,000 Units by mouth daily.         . Fluticasone-Salmeterol (ADVAIR) 100-50 MCG/DOSE AEPB   Inhalation   Inhale 1 puff into the lungs 2 (two) times daily.         . Investigational - Study Medication   Oral   Take 1 capsule by mouth every morning. Additional Study Details:  Dasotraline Active or Placebo  Protocol:  [id number] Medication Number:  [id number]  Subject Number:  [id number]         . magnesium gluconate (MAGONATE) 500 MG tablet   Oral   Take 500 mg by mouth daily.         . Multiple Vitamin (MULTIVITAMIN WITH MINERALS) TABS tablet   Oral   Take 1 tablet by mouth daily.         . Potassium 99 MG TABS   Oral   Take 99 mg by mouth daily.          . ranitidine (ZANTAC) 150 MG tablet   Oral   Take 150 mg by mouth 2 (two) times daily.         . vitamin C (ASCORBIC ACID) 500 MG tablet   Oral   Take 500 mg by mouth daily as needed (when pt starts feeling sick).         . meclizine (ANTIVERT) 12.5 MG tablet   Oral   Take 1 tablet (12.5 mg total) by mouth 3 (three) times daily as needed for dizziness or nausea.   30 tablet   1     Allergies:  Clindamycin/lincomycin  Family History: Family History  Problem Relation Age of Onset  . Asthma Mother   . Hyperlipidemia Mother   . Asthma Maternal Grandmother   . Dementia Maternal Grandmother   . Hypertension Maternal Grandmother   . Multiple myeloma Maternal Grandmother   . Dementia Maternal Grandfather   . Melanoma Maternal Grandfather   . Heart attack Maternal Grandfather   . Diabetes type II Maternal Grandfather   . Breast cancer Paternal Grandmother   . Hyperlipidemia Paternal Grandmother   . Hypothyroidism Paternal Grandmother   . Inflammatory bowel disease Paternal Grandmother   . Colon cancer Cousin   . Heart attack Paternal Uncle   . Diabetes type II Paternal Aunt   . Diabetes type II Cousin   . Diabetes type II Paternal Uncle   . Hyperlipidemia Paternal Grandfather   . Hypertension Paternal Grandfather   . Hypothyroidism Paternal Grandfather   . Pancreatic cancer Paternal Grandfather   . Hypertension Paternal Uncle   . Hypertension Paternal Aunt   . Kidney disease    . Stroke Maternal Uncle   . Hyperthyroidism Father   . Celiac disease Sister     questionable  . Diabetes Neg Hx   . COPD Neg Hx   . Heart disease Neg Hx     Social History: Social History  Substance Use Topics  . Smoking status: Former Smoker    Types: Cigarettes    Quit date: 05/10/1993  . Smokeless tobacco: Never Used  . Alcohol Use: Yes     Comment: occasional     Review of Systems:   10 point review of systems was performed and was otherwise negative:  Constitutional:  No fever Eyes: No visual disturbances ENT: No sore throat, ear pain Cardiac: No chest pain Respiratory: No shortness of breath, wheezing, or stridor Abdomen: No abdominal pain, no vomiting, No  diarrhea Endocrine: No weight loss, No night sweats Extremities: No peripheral edema, cyanosis Skin: No rashes, easy bruising Neurologic: No focal weakness, trouble with speech or swollowing Urologic: No dysuria, Hematuria, or urinary frequency   Physical Exam:  ED Triage Vitals  Enc Vitals Group     BP 11/04/15 1557 133/48 mmHg     Pulse Rate 11/04/15 1557 86     Resp 11/04/15 1557 16     Temp 11/04/15 1557 98.1 F (36.7 C)     Temp Source 11/04/15 1557 Oral     SpO2 11/04/15 1557 100 %     Weight 11/04/15 1557 220 lb (99.791 kg)     Height 11/04/15 1557 5' 4" (1.626 m)     Head Cir --      Peak Flow --      Pain Score --      Pain Loc --      Pain Edu? --      Excl. in [place]? --     General: Awake , Alert , and Oriented times 3; GCS 15 Head: Normal cephalic , atraumatic Eyes: Pupils equal , round, reactive to light no obvious significant nystagmus. TMs are negative bilaterally for exudate or drainage. Nose/Throat: No nasal drainage, patent upper airway without erythema or exudate.  Neck: Supple, Full range of motion, No anterior adenopathy or palpable thyroid masses Lungs: Clear to ascultation without wheezes , rhonchi, or rales Heart: Regular rate, regular rhythm without murmurs , gallops , or rubs Abdomen: Soft, non tender without rebound, guarding , or rigidity; bowel sounds positive and symmetric in all 4 quadrants. No organomegaly .        Extremities: 2 plus symmetric pulses. No edema, clubbing or cyanosis Neurologic: normal ambulation, Motor symmetric without deficits, sensory intact. Patient had a positive [place]  Skin: warm, dry, no rashes   Labs:   All laboratory work was reviewed including any pertinent negatives or positives listed below:  Labs Reviewed  CBC -  Abnormal; Notable for the following:    WBC 12.1 (*)    MCV 76.0 (*)    MCH 24.2 (*)    MCHC 31.9 (*)    All other components within normal limits  DIFFERENTIAL - Abnormal; Notable for the following:    Neutro Abs 9.9 (*)    All other components within normal limits  COMPREHENSIVE METABOLIC PANEL - Abnormal; Notable for the following:    Glucose, Bld 118 (*)    All other components within normal limits  PROTIME-INR  APTT  TROPONIN I   Laboratory work was reviewed and showed no clinically significant abnormalities.  EKG:  ED ECG REPORT I, Daymon Larsen, the attending physician, personally viewed and interpreted this ECG.  Date: 11/05/2015 EKG Time: 1555 Rate: *78 Rhythm: normal sinus rhythm QRS Axis: normal Intervals: normal ST/T Wave abnormalities: normal Conduction Disturbances: none Narrative Interpretation: unremarkable    Radiology: *    MR Brain W Wo Contrast (Final result) Result time: 11/04/15 18:54:10   Final result by Rad Results In Interface (11/04/15 18:54:10)   Narrative:   CLINICAL DATA: Vertigo  EXAM: MRI HEAD WITHOUT AND WITH CONTRAST  TECHNIQUE: Multiplanar, multiecho pulse sequences of the brain and surrounding structures were obtained without and with intravenous contrast.  CONTRAST: 67m MULTIHANCE GADOBENATE DIMEGLUMINE 529 MG/ML IV SOLN  COMPARISON: CT head 11/04/2015  FINDINGS: Ventricle size normal. Cerebral volume normal.  Negative for acute or chronic infarction  Negative for demyelinating disease. Cerebral white matter normal.  Brainstem and basal ganglia normal.  Negative for intracranial hemorrhage. No fluid collection.  Negative for mass or edema. No shift of the midline structures.  Postcontrast imaging demonstrates normal enhancement. No enhancing mass lesion. Normal arterial and venous enhancement.  Mild mucosal thickening in the paranasal sinuses. Normal normal orbital contents. Normal  pituitary.  IMPRESSION: Normal MRI of the brain with contrast.   Electronically Signed By: Franchot Gallo M.D. On: 11/04/2015 18:54          CT Head Code Stroke W/O CM (Final result) Result time: 11/04/15 16:15:28   Final result by Rad Results In Interface (11/04/15 16:15:28)   Narrative:   CLINICAL DATA: Acute onset dizziness and nausea  EXAM: CT HEAD WITHOUT CONTRAST  TECHNIQUE: Contiguous axial images were obtained from the base of the skull through the vertex without intravenous contrast.  COMPARISON: None.  FINDINGS: The ventricles are normal in size and configuration. There is borderline cerebellar tonsillar ectopia. There is no intracranial mass, hemorrhage, extra-axial fluid collection, or midline shift. Gray-white compartments appear normal. No acute infarct evident. Middle cerebral arteries show symmetric and normal attenuation bilaterally.  No vascular calcifications are evident. The bony calvarium appears intact. The mastoid air cells are clear. Orbits appear symmetric bilaterally. Paranasal sinuses which are visualized are clear.  IMPRESSION: No intracranial mass, hemorrhage, or focal gray -white compartment lesions/acute appearing infarct. Borderline cerebellar tonsillar ectopia.  Critical Value/emergent results were called by telephone at the time of interpretation on 11/04/2015 at 4:15 pm to Dr. Lisa Roca, who verbally acknowledged these results.   Electronically Signed By: Lowella Grip III M.D. On: 11/04/2015 16:15            I personally reviewed the radiologic studies    Critical Care:  CRITICAL CARE Performed by: Daymon Larsen   Total critical care time: 35 minutes  Critical care time was exclusive of separately billable procedures and treating other patients.  Critical care was necessary to treat or prevent imminent or life-threatening deterioration.  Critical care was time spent personally by me  on the following activities: development of treatment plan with patient and/or surrogate as well as nursing, discussions with consultants, evaluation of patient's response to treatment, examination of patient, obtaining history from patient or surrogate, ordering and performing treatments and interventions, ordering and review of laboratory studies, ordering and review of radiographic studies, pulse oximetry and re-evaluation of patient's condition. Initiation and evaluation of possible code stroke   ED Course:  Code stroke was called for the patient upon arrival the patient had a head CT which did not show any significant findings. I evaluated the patient at the bedside and the neurologist also saw the patient through the tele neurology. The system wasn't working appropriately and he can only do verbal history. The patient's symptoms seemed to be more consistent with vertigo, but there was still concern for the possibility of a central cause. Patient had an MRI which not show any abnormalities and was symptomatically improved. She was placed through the maneuvers with some symptomatic improvement be discharged with a prescription for meclizine.    Assessment:  Peripheral vertigo   Final Clinical Impression:   Final diagnoses:  Peripheral vertigo, unspecified laterality     Plan:  Outpatient management Prescription for meclizine Patient was advised to return immediately if condition worsens. Patient was advised to follow up with their primary care physician or other specialized physicians involved in their outpatient care. The patient and/or family member/power of attorney had laboratory results reviewed at  the bedside. All questions and concerns were addressed and appropriate discharge instructions were distributed by the nursing staff.            Daymon Larsen, MD 11/05/15 [phone]

## 2016-08-09 ENCOUNTER — Other Ambulatory Visit: Payer: Self-pay | Admitting: Family Medicine

## 2016-08-09 DIAGNOSIS — N6321 Unspecified lump in the left breast, upper outer quadrant: Secondary | ICD-10-CM

## 2016-08-16 ENCOUNTER — Ambulatory Visit
Admission: RE | Admit: 2016-08-16 | Discharge: 2016-08-16 | Disposition: A | Discharge status: Home / Self Care | Payer: Medicaid Other | Source: Ambulatory Visit | Attending: Family Medicine | Admitting: Family Medicine

## 2016-08-16 DIAGNOSIS — N6321 Unspecified lump in the left breast, upper outer quadrant: Secondary | ICD-10-CM

## 2017-08-03 ENCOUNTER — Encounter: Payer: Self-pay | Admitting: Obstetrics and Gynecology

## 2017-08-03 ENCOUNTER — Ambulatory Visit (INDEPENDENT_AMBULATORY_CARE_PROVIDER_SITE_OTHER): Payer: BLUE CROSS/BLUE SHIELD | Admitting: Obstetrics and Gynecology

## 2017-08-03 VITALS — BP 136/77 | HR 60 | Ht 64.0 in | Wt 232.7 lb

## 2017-08-03 DIAGNOSIS — Z4689 Encounter for fitting and adjustment of other specified devices: Secondary | ICD-10-CM | POA: Diagnosis not present

## 2017-08-03 DIAGNOSIS — E039 Hypothyroidism, unspecified: Secondary | ICD-10-CM | POA: Diagnosis not present

## 2017-08-03 DIAGNOSIS — N393 Stress incontinence (female) (male): Secondary | ICD-10-CM | POA: Diagnosis not present

## 2017-08-03 DIAGNOSIS — N814 Uterovaginal prolapse, unspecified: Secondary | ICD-10-CM | POA: Diagnosis not present

## 2017-08-03 DIAGNOSIS — N939 Abnormal uterine and vaginal bleeding, unspecified: Secondary | ICD-10-CM | POA: Diagnosis not present

## 2017-08-03 DIAGNOSIS — E669 Obesity, unspecified: Secondary | ICD-10-CM

## 2017-08-03 DIAGNOSIS — N898 Other specified noninflammatory disorders of vagina: Secondary | ICD-10-CM | POA: Diagnosis not present

## 2017-08-03 NOTE — Progress Notes (Signed)
Pt is doing well stating that the pessary is working well with holding the bladder in place, but has some leaking.

## 2017-08-03 NOTE — Progress Notes (Signed)
GYNECOLOGY PROGRESS NOTE  Subjective:    Patient ID: Linda Ware, female    DOB: 1970/07/25, 47 y.o.   MRN: [id number]  HPI  Patient is a 47 y.o. [id number] female who presents for f/u with pessary check. She has a pessary in place for cystocele with rectocele and incomplete uterine prolapse.  Patient has not been seen since 2017.  Notes that there was "a lot going on in her life", including having insurance problems.  She is currently maintaining her pessary at home, changing every 8 weeks.  She notes complaints of vaginal odor (especially after her menses), and is having increased urinary leakage (mostly when attempting to exercise).   The following portions of the patient's history were reviewed and updated as appropriate: allergies, current medications, past family history, past medical history, past social history, past surgical history and problem list.  Review of Systems A comprehensive review of systems was negative except for: Genitourinary: positive for abnormal menstrual periods - patient notes that cycles have started coming twice a month some months, and other months she may skip her menses all together.  This has been ongoing for the past 5-6 months.  She just recently started back seeing a PCP and it was noted that her thyroid hormone levels are abnormal, and so she is working to get her thyroid disease under control.   Objective:   Blood pressure 136/77, pulse 60, height 5\' 4"  (1.626 m), weight 232 lb 11.2 oz (105.6 kg), last menstrual period 07/24/2017. Body mass index is 39.94 kg/m. General appearance: alert and no distress, moderate obesity Abdomen: soft, non-tender; bowel sounds normal; no masses,  no organomegaly Pelvic: external genitalia normal, rectovaginal septum normal.  Vagina with thin grey-white discharge, slight odor.  Grade 2 cystocele present, Grade 1-2 rectocele present. Cervix normal appearing, no lesions and no motion tenderness.  Uterus mobile, nontender,  normal shape and size, with Grade 2-3 prolapse.  Adnexa not assessed.  The patient's Size 5 ring with support pessary was removed, cleaned and replaced without complications.  Extremities: extremities normal, atraumatic, no cyanosis or edema Neurologic: Grossly normal    Assessment:   Pessary maintenance Cystocele with rectocele and uterine descent Vaginal odor Stress incontinence Morbid obesity Hypothyroidism Abnormal uterine bleeding   Plan:   1. Pessary maintenance -patient to continue to change her pessary every 8-12 as needed.  However due to her increase in menstruation, I would recommend removing it during times of bleeding to clean.  He will need follow-up in 3-6 months for reevaluation to see a new type of pessary is needed due to her complaints of stress incontinence.   2.  Vaginal odor-see above for pessary maintenance.  Also given patient sample of Trimo-san gel to help decrease vaginal odor.  She should apply the gel once weekly. 3.  Stress incontinence -discussed risk factors including patient's obesity, as she notes she has gained some weight over the past year, as well as her history of pelvic organ prolapse.  Patient is currently starting an exercise regimen to help with weight loss.  I also discussed performance of Kegel exercises regularly.  No changes in symptoms, can discuss in pessary type.  To reevaluate symptoms at next pessary check. 4.  Morbid obesity -may be a contributor to both her worsening incontinence as well as her abnormal bleeding.  Patient is beginning an exercise regimen.  We will continue to follow. 5.  Hypothyroidism -currently being managed by PCP.  Patient notes that she has had some  difficulties in managing her thyroid disease in the recent past due to issues with insurance and medication costs. 6.  Abnormal bleeding -unclear if this is due to medical problems including hypothyroidism, or if this may be due to becoming perimenopausal as she is of age 47.   Will allow patient time to better manage her thyroid disease before proceeding with further evaluation.  If cycles continue to be irregular after her thyroid disease is controlled, patient will likely need further work at all levels and endometrial biopsy as she does have risk factors for endometrial hyperplasia.    Rubie Maid, MD [facility]

## 2017-08-04 ENCOUNTER — Encounter: Payer: Self-pay | Admitting: Obstetrics and Gynecology

## 2017-08-04 DIAGNOSIS — N939 Abnormal uterine and vaginal bleeding, unspecified: Secondary | ICD-10-CM | POA: Insufficient documentation

## 2017-09-28 ENCOUNTER — Encounter (INDEPENDENT_AMBULATORY_CARE_PROVIDER_SITE_OTHER): Payer: Self-pay

## 2018-02-02 ENCOUNTER — Encounter: Payer: BLUE CROSS/BLUE SHIELD | Admitting: Obstetrics and Gynecology

## 2018-05-12 DIAGNOSIS — K625 Hemorrhage of anus and rectum: Secondary | ICD-10-CM | POA: Insufficient documentation

## 2018-05-12 DIAGNOSIS — R1310 Dysphagia, unspecified: Secondary | ICD-10-CM | POA: Insufficient documentation

## 2018-05-12 DIAGNOSIS — R1319 Other dysphagia: Secondary | ICD-10-CM | POA: Insufficient documentation

## 2018-05-12 DIAGNOSIS — R197 Diarrhea, unspecified: Secondary | ICD-10-CM | POA: Insufficient documentation

## 2018-06-21 ENCOUNTER — Encounter: Admission: RE | Payer: Self-pay | Source: Ambulatory Visit

## 2018-06-21 ENCOUNTER — Ambulatory Visit: Admission: RE | Admit: 2018-06-21 | Payer: Self-pay | Source: Ambulatory Visit | Admitting: Internal Medicine

## 2018-06-21 SURGERY — EGD (ESOPHAGOGASTRODUODENOSCOPY)
Anesthesia: General

## 2019-01-25 ENCOUNTER — Emergency Department (HOSPITAL_COMMUNITY)
Admission: EM | Admit: 2019-01-25 | Discharge: 2019-01-25 | Disposition: A | Discharge status: Home / Self Care | Payer: Medicaid Other | Attending: Emergency Medicine | Admitting: Emergency Medicine

## 2019-01-25 ENCOUNTER — Emergency Department (HOSPITAL_COMMUNITY): Payer: Medicaid Other

## 2019-01-25 ENCOUNTER — Other Ambulatory Visit: Payer: Self-pay | Admitting: Emergency Medicine

## 2019-01-25 ENCOUNTER — Encounter (HOSPITAL_COMMUNITY): Payer: Self-pay | Admitting: Emergency Medicine

## 2019-01-25 ENCOUNTER — Other Ambulatory Visit: Payer: Self-pay

## 2019-01-25 DIAGNOSIS — Z87891 Personal history of nicotine dependence: Secondary | ICD-10-CM | POA: Insufficient documentation

## 2019-01-25 DIAGNOSIS — Z793 Long term (current) use of hormonal contraceptives: Secondary | ICD-10-CM | POA: Insufficient documentation

## 2019-01-25 DIAGNOSIS — Z85850 Personal history of malignant neoplasm of thyroid: Secondary | ICD-10-CM | POA: Diagnosis not present

## 2019-01-25 DIAGNOSIS — N939 Abnormal uterine and vaginal bleeding, unspecified: Secondary | ICD-10-CM | POA: Insufficient documentation

## 2019-01-25 DIAGNOSIS — R109 Unspecified abdominal pain: Secondary | ICD-10-CM | POA: Insufficient documentation

## 2019-01-25 DIAGNOSIS — Z79899 Other long term (current) drug therapy: Secondary | ICD-10-CM | POA: Diagnosis not present

## 2019-01-25 DIAGNOSIS — K625 Hemorrhage of anus and rectum: Secondary | ICD-10-CM | POA: Diagnosis present

## 2019-01-25 LAB — CBC
HCT: 38 % (ref 36.0–46.0)
Hemoglobin: 11.9 g/dL — ABNORMAL LOW (ref 12.0–15.0)
MCH: 25.1 pg — ABNORMAL LOW (ref 26.0–34.0)
MCHC: 31.3 g/dL (ref 30.0–36.0)
MCV: 80.2 fL (ref 80.0–100.0)
Platelets: 399 10*3/uL (ref 150–400)
RBC: 4.74 MIL/uL (ref 3.87–5.11)
RDW: 14.1 % (ref 11.5–15.5)
WBC: 15.9 10*3/uL — ABNORMAL HIGH (ref 4.0–10.5)
nRBC: 0 % (ref 0.0–0.2)

## 2019-01-25 LAB — COMPREHENSIVE METABOLIC PANEL
ALT: 20 U/L (ref 0–44)
AST: 18 U/L (ref 15–41)
Albumin: 3.3 g/dL — ABNORMAL LOW (ref 3.5–5.0)
Alkaline Phosphatase: 69 U/L (ref 38–126)
Anion gap: 12 (ref 5–15)
BUN: 12 mg/dL (ref 6–20)
CO2: 20 mmol/L — ABNORMAL LOW (ref 22–32)
Calcium: 8.4 mg/dL — ABNORMAL LOW (ref 8.9–10.3)
Chloride: 106 mmol/L (ref 98–111)
Creatinine, Ser: 0.87 mg/dL (ref 0.44–1.00)
GFR calc Af Amer: >60 mL/min (ref >60)
GFR calc non Af Amer: >60 mL/min (ref >60)
Glucose, Bld: 93 mg/dL (ref 70–99)
Potassium: 3.6 mmol/L (ref 3.5–5.1)
Sodium: 138 mmol/L (ref 135–145)
Total Bilirubin: 0.7 mg/dL (ref 0.3–1.2)
Total Protein: 7 g/dL (ref 6.5–8.1)

## 2019-01-25 LAB — WET PREP, GENITAL
Clue Cells Wet Prep HPF POC: NONE SEEN
Sperm: NONE SEEN
Trich, Wet Prep: NONE SEEN
Yeast Wet Prep HPF POC: NONE SEEN

## 2019-01-25 LAB — C DIFFICILE QUICK SCREEN W PCR REFLEX
C Diff antigen: NEGATIVE
C Diff interpretation: NOT DETECTED
C Diff toxin: NEGATIVE

## 2019-01-25 LAB — I-STAT BETA HCG BLOOD, ED (MC, WL, AP ONLY): I-stat hCG, quantitative: <5 m[IU]/mL (ref <5)

## 2019-01-25 LAB — TYPE AND SCREEN
ABO/RH(D): A POS
Antibody Screen: NEGATIVE

## 2019-01-25 LAB — ABO/RH: ABO/RH(D): A POS

## 2019-01-25 LAB — POC OCCULT BLOOD, ED: Fecal Occult Bld: NEGATIVE

## 2019-01-25 MED ORDER — METRONIDAZOLE 500 MG PO TABS: 500.0000 mg | ORAL_TABLET | Freq: Three times a day (TID) | ORAL | 0 refills | Status: AC

## 2019-01-25 MED ORDER — CIPROFLOXACIN HCL 500 MG PO TABS
500.0000 mg | ORAL_TABLET | Freq: Two times a day (BID) | ORAL | 0 refills | Status: AC
Start: 2019-01-25 — End: 2019-02-04

## 2019-01-25 MED ORDER — IOHEXOL 300 MG/ML  SOLN
100.0000 mL | Freq: Once | INTRAMUSCULAR | Status: AC | PRN
  Administered 2019-01-25: 100 mL via INTRAVENOUS

## 2019-01-25 NOTE — ED Notes (Signed)
Patient transported to CT 

## 2019-01-25 NOTE — ED Triage Notes (Signed)
Pt states she has been having rectal bleeding and abd pain for two weeks. Pt also complains of vaginal bleeding as well. Pt is on birth control pill and she usually does not have a period. Bright red blood in her stool.

## 2019-01-25 NOTE — ED Provider Notes (Addendum)
[facility]ER[facility] DEPART[facility] Provider Note   CSN: [id number] Arrival date & time: 01/25/19  5852     History   Chief Complaint Chief Complaint  Patient presents with  . Rectal Bleeding    HPI Ceonna Frazzini is a 48 y.o. female with history of iron deficiency anemia, cystocele, rectocele and papillary adenocarcinoma of thyroid presenting to ED with two weeks abdominal pain and rectal bleeding. She endorses multiple episodes a day of abdominal cramping relieved by defecation of unformed stools with bright red blood. She has had similar episodes of blood per rectum secondary to internal hemorrhoids but have usually been self-limited. She denies any recent antibiotic use, fevers, chills, unusual food intake, rectal pain or rectal spasms.  Patient also endorses 2.5 weeks of light vaginal bleeding without pelvic pain. She reports that she is taking birth control and normally has light spotting for 2 days per month. However, current episode has lasted for multiple weeks. No other vaginal symptoms. Denies any urinary symptoms.    Past Medical History:  Diagnosis Date  . Alcohol abuse    in college, none in years  . Asthma, allergic   . Cystocele   . GERD (gastroesophageal reflux disease)   . History of dysplastic nevus 05/17/2012   Seen yearly by [facility] for total body exam   . 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 Iron deficiency anemia   . Kidney stone   . Obesity   . Obesity (BMI 30-39.9) 04/24/2012  . Plantar fasciitis   . Pregnant    [redacted] weeks  . Primary thyroid cancer ([place]) 2007   papillary  . Rectocele   . Rosacea   . Sleep apnea    intermittent    Patient Active Problem List   Diagnosis Date Noted  . Abnormal uterine bleeding 08/04/2017  . Stress incontinence 08/20/2015  . Vaginal pessary in situ 08/20/2015  . Cystocele with small rectocele and uterine descent 08/20/2015  . Cystocele or rectocele with  incomplete uterine prolapse 01/29/2015  . Hypersomnia with sleep apnea 10/02/2014  . 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/2014  . 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 Allergic urticaria 12/25/2012  . GERD (gastroesophageal reflux disease) 08/10/2012  . Rosacea 05/22/2012  . History of dysplastic nevus 05/17/2012  . Venous insufficiency of leg 04/24/2012  . Obesity (BMI 30-39.9) 04/24/2012  . Plantar fasciitis of left foot 12/14/2011  . Rotator cuff syndrome of right shoulder 12/14/2011  . Hypothyroid 10/21/2011  . Asthma, mild intermittent, well-controlled 10/21/2011    Past Surgical History:  Procedure Laterality Date  . ESOPHAGOGASTRODUODENOSCOPY  02/19/2009   Dr. Jill Side  . HERNIA REPAIR    . kidney stone removal     x multiple occasions  . THYROIDECTOMY  2007  . tonsillectomy       OB History    Gravida  5   Para  5   Term  5   Preterm  0   AB  0   Living  5     SAB  0   TAB  0   Ectopic  0   Multiple  0   Live Births  1            Home Medications    Prior to Admission medications   Medication Sig Start Date End Date Taking? Authorizing Provider  albuterol (PROVENTIL HFA;VENTOLIN HFA) 108 (90 BASE) MCG/ACT inhaler Inhale 2 puffs into the lungs every 4 (four) hours as  needed for wheezing or shortness of breath. 09/07/13   Hess, Tamela Oddi, DO  ARMOUR THYROID 60 MG tablet Take 60-120 mg by mouth 2 (two) times daily. Pt takes two tablets in the morning and one tablet in the afternoon.    [provider]  Cholecalciferol (VITAMIN D3) 5000 units TABS Take 10,000 Units by mouth daily.    [provider]  Fluticasone-Salmeterol (ADVAIR) 100-50 MCG/DOSE AEPB Inhale 1 puff into the lungs 2 (two) times daily.    [provider]  Investigational - Study Medication Take 1 capsule by mouth every morning. Additional Study Details:  Dasotraline Active or Placebo  Protocol:  [id number] Medication Number:  [id number]   Subject Number:  [id number]    [provider]  magnesium gluconate (MAGONATE) 500 MG tablet Take 500 mg by mouth daily.    [provider]  meclizine (ANTIVERT) 12.5 MG tablet Take 1 tablet (12.5 mg total) by mouth 3 (three) times daily as needed for dizziness or nausea. Patient not taking: Reported on 08/03/2017 11/04/15   Daymon Larsen, MD  Multiple Vitamin (MULTIVITAMIN WITH MINERALS) TABS tablet Take 1 tablet by mouth daily.    [provider]  Potassium 99 MG TABS Take 99 mg by mouth daily.    [provider]  ranitidine (ZANTAC) 150 MG tablet Take 150 mg by mouth 2 (two) times daily.    [provider]  vitamin C (ASCORBIC ACID) 500 MG tablet Take 500 mg by mouth daily as needed (when pt starts feeling sick).    [provider]    Family History Family History  Problem Relation Age of Onset  . Asthma Mother   . Hyperlipidemia Mother   . Hyperthyroidism Father   . Asthma Maternal Grandmother   . Dementia Maternal Grandmother   . Hypertension Maternal Grandmother   . Multiple myeloma Maternal Grandmother   . Dementia Maternal Grandfather   . Melanoma Maternal Grandfather   . Heart attack Maternal Grandfather   . Diabetes type II Maternal Grandfather   . Breast cancer Paternal Grandmother   . Hyperlipidemia Paternal Grandmother   . Hypothyroidism Paternal Grandmother   . Inflammatory bowel disease Paternal Grandmother   . Colon cancer Cousin   . Heart attack Paternal Uncle   . Diabetes type II Paternal Aunt   . Diabetes type II Cousin   . Diabetes type II Paternal Uncle   . Hyperlipidemia Paternal Grandfather   . Hypertension Paternal Grandfather   . Hypothyroidism Paternal Grandfather   . Pancreatic cancer Paternal Grandfather   . Hypertension Paternal Uncle   . Hypertension Paternal Aunt   . Kidney disease Other   . Stroke Maternal Uncle   . Celiac disease Sister        questionable  . Diabetes Neg Hx   . COPD  Neg Hx   . Heart disease Neg Hx     Social History Social History   Tobacco Use  . Smoking status: Former Smoker    Types: Cigarettes    Quit date: 05/10/1993    Years since quitting: 25.7  . Smokeless tobacco: Never Used  Substance Use Topics  . Alcohol use: Yes    Comment: occasional  . Drug use: No     Allergies   Clindamycin/lincomycin   Review of Systems Review of Systems  Constitutional: Negative for activity change, appetite change, chills and fever.  HENT: Negative.   Eyes: Negative for photophobia and visual disturbance.  Respiratory: Negative for cough, chest  tightness, shortness of breath and wheezing.   Cardiovascular: Negative for chest pain and palpitations.  Gastrointestinal: Positive for abdominal pain, blood in stool, diarrhea and nausea. Negative for anal bleeding, rectal pain and vomiting.  Endocrine: Negative.   Genitourinary: Positive for vaginal bleeding. Negative for difficulty urinating, dyspareunia, dysuria, hematuria, pelvic pain, urgency, vaginal discharge and vaginal pain.  Musculoskeletal: Negative.   Skin: Negative.   Allergic/Immunologic: Negative.   Neurological: Negative for dizziness, light-headedness, numbness and headaches.  Hematological: Negative.   Psychiatric/Behavioral: Negative.      Physical Exam Updated Vital Signs BP 130/67 (BP Location: Right Arm)   Pulse 81   Temp 98 F (36.7 C) (Oral)   Resp 16   Ht '5\' 4"'  (1.626 m)   Wt 103 kg   SpO2 100%   BMI 38.96 kg/m   Physical Exam Vitals signs reviewed.  Constitutional:      Appearance: Normal appearance.  HENT:     Head: Normocephalic and atraumatic.     Mouth/Throat:     Mouth: Mucous membranes are moist.     Pharynx: Oropharynx is clear.  Eyes:     Extraocular Movements: Extraocular movements intact.     Pupils: Pupils are equal, round, and reactive to light.  Neck:     Musculoskeletal: Normal range of motion and neck supple.  Cardiovascular:     Rate and  Rhythm: Normal rate and regular rhythm.  Abdominal:     General: Bowel sounds are normal. There is no distension.     Palpations: Abdomen is soft.     Tenderness: There is abdominal tenderness.     Comments: LLQ tenderness  Genitourinary:    General: Normal vulva.     Rectum: Normal. Guaiac result negative.  Musculoskeletal: Normal range of motion.        General: No swelling, tenderness, deformity or signs of injury.  Skin:    General: Skin is warm and dry.     Capillary Refill: Capillary refill takes less than 2 seconds.  Neurological:     General: No focal deficit present.     Mental Status: She is alert and oriented to person, place, and time.     Cranial Nerves: No cranial nerve deficit.     Sensory: No sensory deficit.     Motor: No weakness.     Coordination: Coordination normal.     ED Treatments / Results  Labs (all labs ordered are listed, but only abnormal results are displayed) Labs Reviewed  COMPREHENSIVE METABOLIC PANEL - Abnormal; Notable for the following components:      Result Value   CO2 20 (*)    Calcium 8.4 (*)    Albumin 3.3 (*)    All other components within normal limits  CBC - Abnormal; Notable for the following components:   WBC 15.9 (*)    Hemoglobin 11.9 (*)    MCH 25.1 (*)    All other components within normal limits  I-STAT BETA HCG BLOOD, ED ([facility], [facility], [facility] ONLY)  POC OCCULT BLOOD, ED  TYPE AND SCREEN  ABO/RH    EKG None  Radiology No results found.  Procedures Procedures (including critical care time)  Medications Ordered in ED Medications - No data to display   Initial Impression / Assessment and Plan / ED Course  I have reviewed the triage vital signs and the nursing notes.  Pertinent labs & imaging results that were available during my care of the patient were reviewed by me and considered in  my medical decision making (see chart for details).  Patient is a 48yo female with history of IDA, cystocele and rectocele presenting  to ED with 2 weeks of abdominal pain with rectal bleeding and light vaginal bleeding. She endorses multiple episodes per day of abdominal cramping relieved by defecation and notes that stools are mostly unformed and appear to be dark and tarry in color with bright red blood. She reports that she has had similar episodes of bright red blood per rectum as she has internal hemorrhoids but those episodes have been self limited and last episode was several months ago. She denies any rectal pain or rectal spasms. No recent antibiotic use, fevers or chills.  She has also had 2.5wks of light vaginal bleeding. No associated pelvic pain. Patient states that she is on birth control and normally only has light spotting monthly for 2 days. However, her current episodes has continued for 2.5 wks. No other vaginal or urinary symptoms noted.  On examination, patient is hemodynamically stable. She is resting comfortably in bed and does not appear to be in any acute distress. She has normoactive bowel sounds and tenderness to palpation in LLQ. On pelvic examination, no tenderness noted with manipulation of uterus. On exam, cervix is visualized with active dark red bleeding. Rectal exam benign. No hemorrhoids noted on exam. No active bleeding on rectal exam.  Labs significant for elevated WBC.  Obtaining CT Abdomen w/contrast to evaluate for colitis vs diverticulitis. As symptoms have been persisting for 2 weeks, will treat for colitis with empiric antibiotics as outpatient and recommend for follow up with PCP and GI.  Ordered STI panel to assess for etiology of vaginal bleeding but likely will need follow up with OB/GYN for further evaluation.   Patient signed out to oncoming provider. Patient to be discharged to home with ciprofloxacin and flagyl and follow up with PCP and GI.   Final Clinical Impressions(s) / ED Diagnoses   Final diagnoses:  None    ED Discharge Orders    None       Harvie Heck, MD 01/25/19  1545    Harvie Heck, MD 01/25/19 1548    Gareth Morgan, MD 01/25/19 2203

## 2019-01-26 LAB — RPR: RPR Ser Ql: NONREACTIVE

## 2019-01-26 LAB — HIV ANTIBODY (ROUTINE TESTING W REFLEX): HIV Screen 4th Generation wRfx: NONREACTIVE

## 2019-01-27 LAB — CERVICOVAGINAL ANCILLARY ONLY
Chlamydia: NEGATIVE
Neisseria Gonorrhea: NEGATIVE

## 2019-01-29 ENCOUNTER — Emergency Department (HOSPITAL_COMMUNITY)
Admission: EM | Admit: 2019-01-29 | Discharge: 2019-01-29 | Disposition: A | Discharge status: Home / Self Care | Payer: Medicaid Other | Attending: Emergency Medicine | Admitting: Emergency Medicine

## 2019-01-29 ENCOUNTER — Encounter (HOSPITAL_COMMUNITY): Payer: Self-pay | Admitting: Emergency Medicine

## 2019-01-29 DIAGNOSIS — K625 Hemorrhage of anus and rectum: Secondary | ICD-10-CM | POA: Insufficient documentation

## 2019-01-29 DIAGNOSIS — Z79899 Other long term (current) drug therapy: Secondary | ICD-10-CM | POA: Diagnosis not present

## 2019-01-29 DIAGNOSIS — J45909 Unspecified asthma, uncomplicated: Secondary | ICD-10-CM | POA: Diagnosis not present

## 2019-01-29 DIAGNOSIS — Z87891 Personal history of nicotine dependence: Secondary | ICD-10-CM | POA: Diagnosis not present

## 2019-01-29 LAB — COMPREHENSIVE METABOLIC PANEL
ALT: 17 U/L (ref 0–44)
AST: 17 U/L (ref 15–41)
Albumin: 3.2 g/dL — ABNORMAL LOW (ref 3.5–5.0)
Alkaline Phosphatase: 63 U/L (ref 38–126)
Anion gap: 10 (ref 5–15)
BUN: 10 mg/dL (ref 6–20)
CO2: 21 mmol/L — ABNORMAL LOW (ref 22–32)
Calcium: 8.8 mg/dL — ABNORMAL LOW (ref 8.9–10.3)
Chloride: 109 mmol/L (ref 98–111)
Creatinine, Ser: 0.81 mg/dL (ref 0.44–1.00)
GFR calc Af Amer: >60 mL/min (ref >60)
GFR calc non Af Amer: >60 mL/min (ref >60)
Glucose, Bld: 100 mg/dL — ABNORMAL HIGH (ref 70–99)
Potassium: 3.4 mmol/L — ABNORMAL LOW (ref 3.5–5.1)
Sodium: 140 mmol/L (ref 135–145)
Total Bilirubin: 0.1 mg/dL — ABNORMAL LOW (ref 0.3–1.2)
Total Protein: 6.7 g/dL (ref 6.5–8.1)

## 2019-01-29 LAB — CBC
HCT: 38.3 % (ref 36.0–46.0)
Hemoglobin: 11.8 g/dL — ABNORMAL LOW (ref 12.0–15.0)
MCH: 24.9 pg — ABNORMAL LOW (ref 26.0–34.0)
MCHC: 30.8 g/dL (ref 30.0–36.0)
MCV: 81 fL (ref 80.0–100.0)
Platelets: 349 10*3/uL (ref 150–400)
RBC: 4.73 MIL/uL (ref 3.87–5.11)
RDW: 13.9 % (ref 11.5–15.5)
WBC: 13.6 10*3/uL — ABNORMAL HIGH (ref 4.0–10.5)
nRBC: 0 % (ref 0.0–0.2)

## 2019-01-29 LAB — TYPE AND SCREEN
ABO/RH(D): A POS
Antibody Screen: NEGATIVE

## 2019-01-29 LAB — I-STAT BETA HCG BLOOD, ED (MC, WL, AP ONLY): I-stat hCG, quantitative: <5 m[IU]/mL (ref <5)

## 2019-01-29 LAB — POC OCCULT BLOOD, ED: Fecal Occult Bld: POSITIVE — AB

## 2019-01-29 MED ORDER — DICYCLOMINE HCL 20 MG PO TABS: 20.0000 mg | ORAL_TABLET | Freq: Two times a day (BID) | ORAL | 0 refills | Status: DC

## 2019-01-29 MED ORDER — LIDOCAINE 5 % EX OINT: 1.0000 "application " | TOPICAL_OINTMENT | CUTANEOUS | 0 refills | Status: DC | PRN

## 2019-01-29 NOTE — ED Notes (Signed)
Dr. Alessandra Bevels paged to [place], [place] paged by Levada Dy

## 2019-01-29 NOTE — ED Provider Notes (Signed)
[facility] EMERGENCY DEPARTMENT Provider Note   CSN: [id number] Arrival date & time: 01/29/19  6429     History   Chief Complaint Chief Complaint  Patient presents with  . GI Bleeding    HPI Linda Ware is a 48 y.o. female.     HPI   Linda Ware is a 48 y.o. female, with a history of GERD, presenting to the ED with bright red blood per rectum for the last three weeks.  Multiple episodes of "explosive" diarrhea, about every two hours.  She notes clumps of dark stool intermixed with bright red blood coloring the toilet water. She complains of rectal pain frequently.  She will have abdominal cramping just prior to having a bowel movement.  Accompanied by chills. She had a telemedicine consult with [facility] with Dr. Penelope Coop and has been scheduled for a colonoscopy Friday, September 25.  Patient was seen in the ED September 17.  CT abdomen pelvis unremarkable. Patient was prescribed Cipro and Flagyl due to persistent symptoms. Patient states she did not take these medications because she "was not sure that was the problem."  Patient has concern for colon cancer. Denies regular NSAID or alcohol use. Denies fever, persistent abdominal pain, constipation, chest pain, shortness of breath, syncope, N/V, or any other complaints.   Past Medical History:  Diagnosis Date  . Alcohol abuse    in college, none in years  . Asthma, allergic   . Cystocele   . GERD (gastroesophageal reflux disease)   . History of dysplastic nevus 05/17/2012   Seen yearly by [facility] for total body exam   . 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 Iron deficiency anemia   . Kidney stone   . Obesity   . Obesity (BMI 30-39.9) 04/24/2012  . Plantar fasciitis   . Pregnant    [redacted] weeks  . Primary thyroid cancer ([place]) 2007   papillary  . Rectocele   . Rosacea   . Sleep apnea    intermittent    Patient Active Problem List   Diagnosis Date Noted  .  Abnormal uterine bleeding 08/04/2017  . Stress incontinence 08/20/2015  . Vaginal pessary in situ 08/20/2015  . Cystocele with small rectocele and uterine descent 08/20/2015  . Cystocele or rectocele with incomplete uterine prolapse 01/29/2015  . Hypersomnia with sleep apnea 10/02/2014  . 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/2014  . 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 Allergic urticaria 12/25/2012  . GERD (gastroesophageal reflux disease) 08/10/2012  . Rosacea 05/22/2012  . History of dysplastic nevus 05/17/2012  . Venous insufficiency of leg 04/24/2012  . Obesity (BMI 30-39.9) 04/24/2012  . Plantar fasciitis of left foot 12/14/2011  . Rotator cuff syndrome of right shoulder 12/14/2011  . Hypothyroid 10/21/2011  . Asthma, mild intermittent, well-controlled 10/21/2011    Past Surgical History:  Procedure Laterality Date  . ESOPHAGOGASTRODUODENOSCOPY  02/19/2009   Dr. Jill Side  . HERNIA REPAIR    . kidney stone removal     x multiple occasions  . THYROIDECTOMY  2007  . tonsillectomy       OB History    Gravida  5   Para  5   Term  5   Preterm  0   AB  0   Living  5     SAB  0   TAB  0   Ectopic  0   Multiple  0   Live Births  1  Home Medications    Prior to Admission medications   Medication Sig Start Date End Date Taking? Authorizing Provider  albuterol (PROVENTIL HFA;VENTOLIN HFA) 108 (90 BASE) MCG/ACT inhaler Inhale 2 puffs into the lungs every 4 (four) hours as needed for wheezing or shortness of breath. 09/07/13   Hess, Tamela Oddi, DO  ARMOUR THYROID 60 MG tablet Take 60-120 mg by mouth 2 (two) times daily. Pt takes two tablets in the morning and one tablet in the afternoon.    [provider]  Cholecalciferol (VITAMIN D3) 5000 units TABS Take 10,000 Units by mouth daily.    [provider]  ciprofloxacin (CIPRO) 500 MG tablet Take 1 tablet (500 mg total) by mouth 2 (two) times daily for 10 days.  01/25/19 02/04/19  Harvie Heck, MD  dicyclomine (BENTYL) 20 MG tablet Take 1 tablet (20 mg total) by mouth 2 (two) times daily. 01/29/19   Andrewjames Weirauch C, PA-C  Fluticasone-Salmeterol (ADVAIR) 100-50 MCG/DOSE AEPB Inhale 1 puff into the lungs 2 (two) times daily.    [provider]  Investigational - Study Medication Take 1 capsule by mouth every morning. Additional Study Details:  Dasotraline Active or Placebo  Protocol:  [id number] Medication Number:  [id number]  Subject Number:  [id number]    [provider]  lidocaine (XYLOCAINE) 5 % ointment Apply 1 application topically as needed. 01/29/19   Romen Yutzy C, PA-C  magnesium gluconate (MAGONATE) 500 MG tablet Take 500 mg by mouth daily.    [provider]  meclizine (ANTIVERT) 12.5 MG tablet Take 1 tablet (12.5 mg total) by mouth 3 (three) times daily as needed for dizziness or nausea. Patient not taking: Reported on 08/03/2017 11/04/15   Daymon Larsen, MD  metroNIDAZOLE (FLAGYL) 500 MG tablet Take 1 tablet (500 mg total) by mouth 3 (three) times daily for 10 days. 01/25/19 02/04/19  Harvie Heck, MD  Multiple Vitamin (MULTIVITAMIN WITH MINERALS) TABS tablet Take 1 tablet by mouth daily.    [provider]  Potassium 99 MG TABS Take 99 mg by mouth daily.    [provider]  ranitidine (ZANTAC) 150 MG tablet Take 150 mg by mouth 2 (two) times daily.    [provider]  vitamin C (ASCORBIC ACID) 500 MG tablet Take 500 mg by mouth daily as needed (when pt starts feeling sick).    [provider]    Family History Family History  Problem Relation Age of Onset  . Asthma Mother   . Hyperlipidemia Mother   . Hyperthyroidism Father   . Asthma Maternal Grandmother   . Dementia Maternal Grandmother   . Hypertension Maternal Grandmother   . Multiple myeloma Maternal Grandmother   . Dementia Maternal Grandfather   . Melanoma Maternal Grandfather   . Heart attack Maternal Grandfather   .  Diabetes type II Maternal Grandfather   . Breast cancer Paternal Grandmother   . Hyperlipidemia Paternal Grandmother   . Hypothyroidism Paternal Grandmother   . Inflammatory bowel disease Paternal Grandmother   . Colon cancer Cousin   . Heart attack Paternal Uncle   . Diabetes type II Paternal Aunt   . Diabetes type II Cousin   . Diabetes type II Paternal Uncle   . Hyperlipidemia Paternal Grandfather   . Hypertension Paternal Grandfather   . Hypothyroidism Paternal Grandfather   . Pancreatic cancer Paternal Grandfather   . Hypertension Paternal Uncle   . Hypertension Paternal Aunt   . Kidney disease Other   . Stroke Maternal  Uncle   . Celiac disease Sister        questionable  . Diabetes Neg Hx   . COPD Neg Hx   . Heart disease Neg Hx     Social History Social History   Tobacco Use  . Smoking status: Former Smoker    Types: Cigarettes    Quit date: 05/10/1993    Years since quitting: 25.7  . Smokeless tobacco: Never Used  Substance Use Topics  . Alcohol use: Yes    Comment: occasional  . Drug use: No     Allergies   Clindamycin/lincomycin   Review of Systems Review of Systems  Constitutional: Positive for chills. Negative for fever.  Respiratory: Negative for cough and shortness of breath.   Cardiovascular: Negative for chest pain.  Gastrointestinal: Positive for abdominal pain (intermittent cramping), blood in stool, diarrhea and rectal pain. Negative for nausea and vomiting.  Musculoskeletal: Negative for back pain.  Neurological: Negative for syncope, weakness and numbness.  All other systems reviewed and are negative.    Physical Exam Updated Vital Signs BP 130/66 (BP Location: Right Arm)   Pulse 80   Temp 99 F (37.2 C) (Oral)   Resp 14   SpO2 100%   Physical Exam Vitals signs and nursing note reviewed.  Constitutional:      General: She is not in acute distress.    Appearance: She is well-developed. She is not diaphoretic.  HENT:     Head:  Normocephalic and atraumatic.     Mouth/Throat:     Mouth: Mucous membranes are moist.     Pharynx: Oropharynx is clear.  Eyes:     Conjunctiva/sclera: Conjunctivae normal.  Neck:     Musculoskeletal: Neck supple.  Cardiovascular:     Rate and Rhythm: Normal rate and regular rhythm.     Pulses: Normal pulses.          Radial pulses are 2+ on the right side and 2+ on the left side.       Posterior tibial pulses are 2+ on the right side and 2+ on the left side.     Heart sounds: Normal heart sounds.     Comments: Tactile temperature in the extremities appropriate and equal bilaterally. Pulmonary:     Effort: Pulmonary effort is normal. No respiratory distress.     Breath sounds: Normal breath sounds.  Abdominal:     Palpations: Abdomen is soft.     Tenderness: There is no abdominal tenderness. There is no guarding.  Genitourinary:    Comments: Scant, bright red blood perianally.  Multiple nonthrombosed external hemorrhoids. Circumferential rectal tenderness without noted masses.  No noted melena.  No free-flowing hemorrhage. Musculoskeletal:     Right lower leg: No edema.     Left lower leg: No edema.  Lymphadenopathy:     Cervical: No cervical adenopathy.  Skin:    General: Skin is warm and dry.  Neurological:     Mental Status: She is alert.  Psychiatric:        Mood and Affect: Mood and affect normal.        Speech: Speech normal.        Behavior: Behavior normal.      ED Treatments / Results  Labs (all labs ordered are listed, but only abnormal results are displayed) Labs Reviewed  COMPREHENSIVE METABOLIC PANEL - Abnormal; Notable for the following components:      Result Value   Potassium 3.4 (*)    CO2 21 (*)  Glucose, Bld 100 (*)    Calcium 8.8 (*)    Albumin 3.2 (*)    Total Bilirubin 0.1 (*)    All other components within normal limits  CBC - Abnormal; Notable for the following components:   WBC 13.6 (*)    Hemoglobin 11.8 (*)    MCH 24.9 (*)    All  other components within normal limits  POC OCCULT BLOOD, ED - Abnormal; Notable for the following components:   Fecal Occult Bld POSITIVE (*)    All other components within normal limits  I-STAT BETA HCG BLOOD, ED ([facility], [facility], [facility] ONLY)  TYPE AND SCREEN   Hemoglobin  Date Value Ref Range Status  01/29/2019 11.8 (L) 12.0 - 15.0 g/dL Final  01/25/2019 11.9 (L) 12.0 - 15.0 g/dL Final  11/04/2015 12.0 12.0 - 16.0 g/dL Final  04/14/2013 10.2 (L) 12.0 - 15.0 g/dL Final   EKG None  Radiology No results found.  Procedures Procedures (including critical care time)  Medications Ordered in ED Medications - No data to display   Initial Impression / Assessment and Plan / ED Course  I have reviewed the triage vital signs and the nursing notes.  Pertinent labs & imaging results that were available during my care of the patient were reviewed by me and considered in my medical decision making (see chart for details).  Clinical Course as of Jan 30 838  Mon Jan 29, 2019  1351 Spoke with Dr. Alessandra Bevels, [facility].  Keep appointment for colonoscopy on Friday. Take the antibiotics that were previously prescribed. Lidocaine 5% ointment or hydrocortisone suppositories may be used for symptomatic treatment of rectal pain.   [SJ]    Clinical Course User Index [SJ] Dairon Procter C, PA-C       Patient presents with 3 weeks of bright red blood per rectum as well as rectal pain. Patient is nontoxic appearing, afebrile, not tachycardic, not tachypneic, not hypotensive, maintains excellent SPO2 on room air, and is in no apparent distress.  Hemoglobin consistent with previous values.  Leukocytosis noted, but improved from recent previous. Hemoccult positive, but without free-flowing hemorrhage. Patient advised to try the course of antibiotics, keep her colonoscopy appointment, and she will continue to follow-up with GI as an outpatient. The patient was given instructions for home care as well as return  precautions. Patient voices understanding of these instructions, accepts the plan, and is comfortable with discharge.  Vitals:   01/29/19 1302 01/29/19 1515 01/29/19 1545 01/29/19 1600  BP: 130/62 118/82 134/65 131/61  Pulse: 81 87 88 81  Resp: 16 (!) 28 (!) 21 18  Temp: 98.8 F (37.1 C)     TempSrc: Oral     SpO2: 96% 98% 98% 96%     Final Clinical Impressions(s) / ED Diagnoses   Final diagnoses:  Rectal bleeding    ED Discharge Orders         Ordered    lidocaine (XYLOCAINE) 5 % ointment  As needed     01/29/19 1545    dicyclomine (BENTYL) 20 MG tablet  2 times daily     01/29/19 1545           Riyana Biel, Helane Gunther, PA-C 01/31/19 0840    Sherwood Gambler, MD 02/04/19 1531

## 2019-01-29 NOTE — Discharge Instructions (Addendum)
May use the lidocaine ointment, as needed, for rectal pain. Dicyclomine: Dicyclomine (generic for Bentyl) is what is known as an antispasmodic and is intended to help reduce abdominal discomfort.  Follow-up with the gastroenterologist, as scheduled.

## 2019-01-29 NOTE — ED Triage Notes (Addendum)
Pt reports rectal pain and BRB with some clumps of black. States she was here a few days ago for the same and was told to follow up with GI, she called GI today who told her to come to the ED. Denies dizziness or lightheadedness.

## 2019-01-29 NOTE — ED Notes (Signed)
ED Provider at bedside. 

## 2019-02-08 ENCOUNTER — Emergency Department: Payer: Medicaid Other

## 2019-02-08 ENCOUNTER — Emergency Department
Admission: EM | Admit: 2019-02-08 | Discharge: 2019-02-08 | Disposition: A | Discharge status: Home / Self Care | Payer: Medicaid Other | Attending: Emergency Medicine | Admitting: Emergency Medicine

## 2019-02-08 ENCOUNTER — Encounter: Payer: Self-pay | Admitting: Emergency Medicine

## 2019-02-08 ENCOUNTER — Other Ambulatory Visit: Payer: Self-pay

## 2019-02-08 DIAGNOSIS — E039 Hypothyroidism, unspecified: Secondary | ICD-10-CM | POA: Diagnosis not present

## 2019-02-08 DIAGNOSIS — D500 Iron deficiency anemia secondary to blood loss (chronic): Secondary | ICD-10-CM

## 2019-02-08 DIAGNOSIS — Z87891 Personal history of nicotine dependence: Secondary | ICD-10-CM | POA: Diagnosis not present

## 2019-02-08 DIAGNOSIS — J45909 Unspecified asthma, uncomplicated: Secondary | ICD-10-CM | POA: Diagnosis not present

## 2019-02-08 DIAGNOSIS — R600 Localized edema: Secondary | ICD-10-CM | POA: Diagnosis not present

## 2019-02-08 DIAGNOSIS — M7989 Other specified soft tissue disorders: Secondary | ICD-10-CM

## 2019-02-08 DIAGNOSIS — Z79899 Other long term (current) drug therapy: Secondary | ICD-10-CM | POA: Diagnosis not present

## 2019-02-08 LAB — URINALYSIS, COMPLETE (UACMP) WITH MICROSCOPIC
Bacteria, UA: NONE SEEN
Bilirubin Urine: NEGATIVE
Glucose, UA: NEGATIVE mg/dL
Hgb urine dipstick: NEGATIVE
Ketones, ur: NEGATIVE mg/dL
Nitrite: NEGATIVE
Protein, ur: NEGATIVE mg/dL
Specific Gravity, Urine: 1.019 (ref 1.005–1.030)
pH: 5 (ref 5.0–8.0)

## 2019-02-08 LAB — HEPATIC FUNCTION PANEL
ALT: 16 U/L (ref 0–44)
AST: 15 U/L (ref 15–41)
Albumin: 3 g/dL — ABNORMAL LOW (ref 3.5–5.0)
Alkaline Phosphatase: 58 U/L (ref 38–126)
Bilirubin, Direct: <0.1 mg/dL (ref 0.0–0.2)
Total Bilirubin: 0.5 mg/dL (ref 0.3–1.2)
Total Protein: 6.7 g/dL (ref 6.5–8.1)

## 2019-02-08 LAB — BASIC METABOLIC PANEL
Anion gap: 7 (ref 5–15)
BUN: 12 mg/dL (ref 6–20)
CO2: 26 mmol/L (ref 22–32)
Calcium: 8.6 mg/dL — ABNORMAL LOW (ref 8.9–10.3)
Chloride: 107 mmol/L (ref 98–111)
Creatinine, Ser: 0.92 mg/dL (ref 0.44–1.00)
GFR calc Af Amer: >60 mL/min (ref >60)
GFR calc non Af Amer: >60 mL/min (ref >60)
Glucose, Bld: 93 mg/dL (ref 70–99)
Potassium: 3.7 mmol/L (ref 3.5–5.1)
Sodium: 140 mmol/L (ref 135–145)

## 2019-02-08 LAB — LIPASE, BLOOD: Lipase: 27 U/L (ref 11–51)

## 2019-02-08 LAB — CBC
HCT: 30.4 % — ABNORMAL LOW (ref 36.0–46.0)
Hemoglobin: 9.3 g/dL — ABNORMAL LOW (ref 12.0–15.0)
MCH: 24.5 pg — ABNORMAL LOW (ref 26.0–34.0)
MCHC: 30.6 g/dL (ref 30.0–36.0)
MCV: 80.2 fL (ref 80.0–100.0)
Platelets: 370 10*3/uL (ref 150–400)
RBC: 3.79 MIL/uL — ABNORMAL LOW (ref 3.87–5.11)
RDW: 14.4 % (ref 11.5–15.5)
WBC: 14.1 10*3/uL — ABNORMAL HIGH (ref 4.0–10.5)
nRBC: 0 % (ref 0.0–0.2)

## 2019-02-08 LAB — BRAIN NATRIURETIC PEPTIDE: B Natriuretic Peptide: 57 pg/mL (ref 0.0–100.0)

## 2019-02-08 MED ORDER — IRON 325 (65 FE) MG PO TABS: 1.0000 | ORAL_TABLET | Freq: Every day | ORAL | 0 refills | Status: DC

## 2019-02-08 NOTE — ED Triage Notes (Signed)
Pt in via POV with complaints of bilateral leg swelling x 2 days.  Denies hx of same, denies CHF or prescribed fluid pill.  Reports pain with swelling.  Ambulatory to triage, NAD noted at this time.

## 2019-02-08 NOTE — ED Provider Notes (Signed)
[facility] Emergency Department Provider Note  ____________________________________________  Time seen: Approximately 8:15 PM  I have reviewed the triage vital signs and the nursing notes.   HISTORY  Chief Complaint Leg Swelling    HPI Linda Ware is a 48 y.o. 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  Associated with pain in the legs.  Denies leg claudication.  No chest pain shortness of breath fever chills or sweats.  Nonradiating.  Worse with standing and walking, no alleviating factors.  She is also been having diarrhea for a month.  She recently had a colonoscopy about a week ago which she reports was suspicious for Crohn's disease.  She is awaiting for her surgical pathology results.  Not on steroids currently.  Not on anticoagulants.  She is having bloody stools chronically but denies dizziness lightheadedness or other orthostatic symptoms.   Denies orthopnea or dyspnea on exertion or PND.   Past Medical History:  Diagnosis Date  . Alcohol abuse    in college, none in years  . Asthma, allergic   . Cystocele   . GERD (gastroesophageal reflux disease)   . History of dysplastic nevus 05/17/2012   Seen yearly by [facility] for total body exam   . 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 Iron deficiency anemia   . Kidney stone   . Obesity   . Obesity (BMI 30-39.9) 04/24/2012  . Plantar fasciitis   . Pregnant    [redacted] weeks  . Primary thyroid cancer ([place]) 2007   papillary  . Rectocele   . Rosacea   . Sleep apnea    intermittent     Patient Active Problem List   Diagnosis Date Noted  . Abnormal uterine bleeding 08/04/2017  . Stress incontinence 08/20/2015  . Vaginal pessary in situ 08/20/2015  . Cystocele with small rectocele and uterine descent 08/20/2015  . Cystocele or rectocele  with incomplete uterine prolapse 01/29/2015  . Hypersomnia with sleep apnea 10/02/2014  . 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/2014  . 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 Allergic urticaria 12/25/2012  . GERD (gastroesophageal reflux disease) 08/10/2012  . Rosacea 05/22/2012  . History of dysplastic nevus 05/17/2012  . Venous insufficiency of leg 04/24/2012  . Obesity (BMI 30-39.9) 04/24/2012  . Plantar fasciitis of left foot 12/14/2011  . Rotator cuff syndrome of right shoulder 12/14/2011  . Hypothyroid 10/21/2011  . Asthma, mild intermittent, well-controlled 10/21/2011     Past Surgical History:  Procedure Laterality Date  . ESOPHAGOGASTRODUODENOSCOPY  02/19/2009   Dr. Jill Side  . HERNIA REPAIR    . kidney stone removal     x multiple occasions  . THYROIDECTOMY  2007  . tonsillectomy       Prior to Admission medications   Medication Sig Start Date End Date Taking? Authorizing Provider  albuterol (PROVENTIL HFA;VENTOLIN HFA) 108 (90 BASE) MCG/ACT inhaler Inhale 2 puffs into the lungs every 4 (four) hours as needed for wheezing or shortness of breath. 09/07/13   Hess, Tamela Oddi, DO  ARMOUR THYROID 60 MG tablet Take 60-120 mg by mouth 2 (two) times daily. Pt takes two tablets in the morning and one tablet in the afternoon.    [provider]  Cholecalciferol (VITAMIN D3) 5000 units TABS Take 10,000 Units by mouth daily.    [provider]  dicyclomine (BENTYL) 20 MG  tablet Take 1 tablet (20 mg total) by mouth 2 (two) times daily. 01/29/19   Joy, Shawn C, PA-C  Ferrous Sulfate (IRON) 325 (65 Fe) MG TABS Take 1 tablet (325 mg total) by mouth daily. 02/08/19 04/09/19  Carrie Mew, MD  Fluticasone-Salmeterol (ADVAIR) 100-50 MCG/DOSE AEPB Inhale 1 puff into the lungs 2 (two) times daily.    [provider]  Investigational - Study Medication Take 1 capsule by mouth every morning. Additional Study Details:  Dasotraline Active or  Placebo  Protocol:  [id number] Medication Number:  [id number]  Subject Number:  [id number]    [provider]  lidocaine (XYLOCAINE) 5 % ointment Apply 1 application topically as needed. 01/29/19   Joy, Shawn C, PA-C  magnesium gluconate (MAGONATE) 500 MG tablet Take 500 mg by mouth daily.    [provider]  meclizine (ANTIVERT) 12.5 MG tablet Take 1 tablet (12.5 mg total) by mouth 3 (three) times daily as needed for dizziness or nausea. Patient not taking: Reported on 08/03/2017 11/04/15   Daymon Larsen, MD  Multiple Vitamin (MULTIVITAMIN WITH MINERALS) TABS tablet Take 1 tablet by mouth daily.    [provider]  Potassium 99 MG TABS Take 99 mg by mouth daily.    [provider]  ranitidine (ZANTAC) 150 MG tablet Take 150 mg by mouth 2 (two) times daily.    [provider]  vitamin C (ASCORBIC ACID) 500 MG tablet Take 500 mg by mouth daily as needed (when pt starts feeling sick).    [provider]     Allergies Clindamycin/lincomycin   Family History  Problem Relation Age of Onset  . Asthma Mother   . Hyperlipidemia Mother   . Hyperthyroidism Father   . Asthma Maternal Grandmother   . Dementia Maternal Grandmother   . Hypertension Maternal Grandmother   . Multiple myeloma Maternal Grandmother   . Dementia Maternal Grandfather   . Melanoma Maternal Grandfather   . Heart attack Maternal Grandfather   . Diabetes type II Maternal Grandfather   . Breast cancer Paternal Grandmother   . Hyperlipidemia Paternal Grandmother   . Hypothyroidism Paternal Grandmother   . Inflammatory bowel disease Paternal Grandmother   . Colon cancer Cousin   . Heart attack Paternal Uncle   . Diabetes type II Paternal Aunt   . Diabetes type II Cousin   . Diabetes type II Paternal Uncle   . Hyperlipidemia Paternal Grandfather   . Hypertension Paternal Grandfather   . Hypothyroidism Paternal Grandfather   . Pancreatic cancer Paternal Grandfather   .  Hypertension Paternal Uncle   . Hypertension Paternal Aunt   . Kidney disease Other   . Stroke Maternal Uncle   . Celiac disease Sister        questionable  . Diabetes Neg Hx   . COPD Neg Hx   . Heart disease Neg Hx     Social History Social History   Tobacco Use  . Smoking status: Former Smoker    Types: Cigarettes    Quit date: 05/10/1993    Years since quitting: 25.7  . Smokeless tobacco: Never Used  Substance Use Topics  . Alcohol use: Yes  . Drug use: No    Review of Systems  Constitutional:   No fever or chills.  ENT:   No sore throat. No rhinorrhea. Cardiovascular:   No chest pain or syncope. Respiratory:   No dyspnea or cough. Gastrointestinal:   Negative for abdominal pain, vomiting and diarrhea.  Musculoskeletal:  Bilateral leg swelling. All other systems reviewed and are negative except as documented above in ROS and HPI.  ____________________________________________   PHYSICAL EXAM:  VITAL SIGNS: ED Triage Vitals  Enc Vitals Group     BP 02/08/19 1628 (!) 132/54     Pulse Rate 02/08/19 1628 69     Resp 02/08/19 1628 16     Temp 02/08/19 1628 98.4 F (36.9 C)     Temp Source 02/08/19 1628 Oral     SpO2 02/08/19 1628 99 %     Weight 02/08/19 1629 229 lb (103.9 kg)     Height 02/08/19 1629 '5\' 4"'  (1.626 m)     Head Circumference --      Peak Flow --      Pain Score 02/08/19 1629 5     Pain Loc --      Pain Edu? --      Excl. in [place]? --     Vital signs reviewed, nursing assessments reviewed.   Constitutional:   Alert and oriented. Non-toxic appearance. Eyes:   Conjunctivae are normal. EOMI. PERRL. ENT      Head:   Normocephalic and atraumatic.      Nose:   Wearing a mask.      Mouth/Throat:   Wearing a mask.      Neck:   No meningismus. Full ROM. Hematological/Lymphatic/Immunilogical:   No cervical lymphadenopathy. Cardiovascular:   RRR. Symmetric bilateral radial and DP pulses.  No murmurs. Cap refill less than 2 seconds. Respiratory:    Normal respiratory effort without tachypnea/retractions. Breath sounds are clear and equal bilaterally. No wheezes/rales/rhonchi. Gastrointestinal:   Soft and nontender. Non distended. There is no CVA tenderness.  No rebound, rigidity, or guarding.  Musculoskeletal:   Normal range of motion in all extremities. No joint effusions.  No lower extremity tenderness.  Nonpitting edema bilateral lower extremities, right greater than left.  Negative Homans sign, no palpable cords, no streaking or inflammatory changes.  No crepitus. Neurologic:   Normal speech and language.  Motor grossly intact. No acute focal neurologic deficits are appreciated.  Skin:    Skin is warm, dry and intact. No rash noted.  No petechiae, purpura, or bullae.  ____________________________________________    LABS (pertinent positives/negatives) (all labs ordered are listed, but only abnormal results are displayed) Labs Reviewed  CBC - Abnormal; Notable for the following components:      Result Value   WBC 14.1 (*)    RBC 3.79 (*)    Hemoglobin 9.3 (*)    HCT 30.4 (*)    MCH 24.5 (*)    All other components within normal limits  BASIC METABOLIC PANEL - Abnormal; Notable for the following components:   Calcium 8.6 (*)    All other components within normal limits  HEPATIC FUNCTION PANEL - Abnormal; Notable for the following components:   Albumin 3.0 (*)    All other components within normal limits  URINALYSIS, COMPLETE (UACMP) WITH MICROSCOPIC - Abnormal; Notable for the following components:   Color, Urine YELLOW (*)    APPearance HAZY (*)    Leukocytes,Ua TRACE (*)    All other components within normal limits  BRAIN NATRIURETIC PEPTIDE  LIPASE, BLOOD   ____________________________________________   EKG    ____________________________________________    RADIOLOGY  Dg Chest 2 View  Result Date: 02/08/2019 CLINICAL DATA:  Bilateral leg swelling for 2 days. EXAM: CHEST - 2 VIEW COMPARISON:  None.  FINDINGS: The heart size and mediastinal contours are within  normal limits. Both lungs are clear. The visualized skeletal structures are unremarkable. IMPRESSION: No active cardiopulmonary disease. Electronically Signed   By: Abelardo Diesel M.D.   On: 02/08/2019 18:54   US Venous Img Lower Bilateral  Result Date: 02/08/2019 CLINICAL DATA:  Initial evaluation for acute bilateral leg swelling for 2 days. EXAM: BILATERAL LOWER EXTREMITY VENOUS DOPPLER ULTRASOUND TECHNIQUE: Gray-scale sonography with graded compression, as well as color Doppler and duplex ultrasound were performed to evaluate the lower extremity deep venous systems from the level of the common femoral vein and including the common femoral, femoral, profunda femoral, popliteal and calf veins including the posterior tibial, peroneal and gastrocnemius veins when visible. The superficial great saphenous vein was also interrogated. Spectral Doppler was utilized to evaluate flow at rest and with distal augmentation maneuvers in the common femoral, femoral and popliteal veins. COMPARISON:  None. FINDINGS: RIGHT LOWER EXTREMITY Common Femoral Vein: No evidence of thrombus. Normal compressibility, respiratory phasicity and response to augmentation. Saphenofemoral Junction: No evidence of thrombus. Normal compressibility and flow on color Doppler imaging. Profunda Femoral Vein: No evidence of thrombus. Normal compressibility and flow on color Doppler imaging. Femoral Vein: No evidence of thrombus. Normal compressibility, respiratory phasicity and response to augmentation. Popliteal Vein: No evidence of thrombus. Normal compressibility, respiratory phasicity and response to augmentation. Calf Veins: No evidence of thrombus. Normal compressibility and flow on color Doppler imaging. Superficial Great Saphenous Vein: No evidence of thrombus. Normal compressibility. Venous Reflux:  None. Other Findings:  None. LEFT LOWER EXTREMITY Common Femoral Vein: No evidence  of thrombus. Normal compressibility, respiratory phasicity and response to augmentation. Saphenofemoral Junction: No evidence of thrombus. Normal compressibility and flow on color Doppler imaging. Profunda Femoral Vein: No evidence of thrombus. Normal compressibility and flow on color Doppler imaging. Femoral Vein: No evidence of thrombus. Normal compressibility, respiratory phasicity and response to augmentation. Popliteal Vein: No evidence of thrombus. Normal compressibility, respiratory phasicity and response to augmentation. Calf Veins: No evidence of thrombus. Normal compressibility and flow on color Doppler imaging. Superficial Great Saphenous Vein: No evidence of thrombus. Normal compressibility. Venous Reflux:  None. Other Findings:  None. IMPRESSION: No evidence of deep venous thrombosis in either lower extremity. Electronically Signed   By: Jeannine Boga M.D.   On: 02/08/2019 19:20    ____________________________________________   PROCEDURES Procedures  ____________________________________________  DIFFERENTIAL DIAGNOSIS   DVT, hypoproteinemia, volume overload.  Doubt ACS or new onset heart failure.  CLINICAL IMPRESSION / ASSESSMENT AND PLAN / ED COURSE  Medications ordered in the ED: Medications - No data to display  Pertinent labs & imaging results that were available during my care of the patient were reviewed by me and considered in my medical decision making (see chart for details).  Linda Ware was evaluated in Emergency Department on 02/08/2019 for the symptoms described in the history of present illness. She was evaluated in the context of the global COVID-19 pandemic, which necessitated consideration that the patient might be at risk for infection with the SARS-CoV-2 virus that causes COVID-19. Institutional protocols and algorithms that pertain to the evaluation of patients at risk for COVID-19 are in a state of rapid change based on information released by  regulatory bodies including the CDC and federal and state organizations. These policies and algorithms were followed during the patient's care in the ED.   Patient presents with bilateral leg swelling which is a new issue for her in the setting of recent new diagnosis of likely inflammatory bowel disease and chronic blood  loss.  Initial labs unremarkable except for hemoglobin of 9 which is decreased from a baseline of about 11.5, explained by her chronic blood loss.  Denies any acute symptomatic anemia symptoms, hemodynamically stable.  Protein level is slightly low but not severely so.  Ultrasounds of bilateral lower extremities negative for DVT.  No evidence of arterial occlusion or phlegmasia.  I will start her on an iron supplement for now, have her follow-up with primary care as well as her continued GI follow-up.  Patient is comfortable with this plan.      ____________________________________________   FINAL CLINICAL IMPRESSION(S) / ED DIAGNOSES    Final diagnoses:  Leg swelling  Iron deficiency anemia due to chronic blood loss     ED Discharge Orders         Ordered    Ferrous Sulfate (IRON) 325 (65 Fe) MG TABS  Daily     02/08/19 2014          Portions of this note were generated with dragon dictation software. Dictation errors may occur despite best attempts at proofreading.   Carrie Mew, MD 02/08/19 2019

## 2019-02-08 NOTE — Discharge Instructions (Addendum)
Results for orders placed or performed during the hospital encounter of 02/08/19  CBC  Result Value Ref Range   WBC 14.1 (H) 4.0 - 10.5 K/uL   RBC 3.79 (L) 3.87 - 5.11 MIL/uL   Hemoglobin 9.3 (L) 12.0 - 15.0 g/dL   HCT 30.4 (L) 36.0 - 46.0 %   MCV 80.2 80.0 - 100.0 fL   MCH 24.5 (L) 26.0 - 34.0 pg   MCHC 30.6 30.0 - 36.0 g/dL   RDW 14.4 11.5 - 15.5 %   Platelets 370 150 - 400 K/uL   nRBC 0.0 0.0 - 0.2 %  Basic metabolic panel  Result Value Ref Range   Sodium 140 135 - 145 mmol/L   Potassium 3.7 3.5 - 5.1 mmol/L   Chloride 107 98 - 111 mmol/L   CO2 26 22 - 32 mmol/L   Glucose, Bld 93 70 - 99 mg/dL   BUN 12 6 - 20 mg/dL   Creatinine, Ser 0.92 0.44 - 1.00 mg/dL   Calcium 8.6 (L) 8.9 - 10.3 mg/dL   GFR calc non Af Amer >60 >60 mL/min   GFR calc Af Amer >60 >60 mL/min   Anion gap 7 5 - 15  Brain natriuretic peptide  Result Value Ref Range   B Natriuretic Peptide 57.0 0.0 - 100.0 pg/mL  Lipase, blood  Result Value Ref Range   Lipase 27 11 - 51 U/L  Hepatic function panel  Result Value Ref Range   Total Protein 6.7 6.5 - 8.1 g/dL   Albumin 3.0 (L) 3.5 - 5.0 g/dL   AST 15 15 - 41 U/L   ALT 16 0 - 44 U/L   Alkaline Phosphatase 58 38 - 126 U/L   Total Bilirubin 0.5 0.3 - 1.2 mg/dL   Bilirubin, Direct <0.1 0.0 - 0.2 mg/dL   Indirect Bilirubin NOT CALCULATED 0.3 - 0.9 mg/dL  Urinalysis, Complete w Microscopic  Result Value Ref Range   Color, Urine YELLOW (A) YELLOW   APPearance HAZY (A) CLEAR   Specific Gravity, Urine 1.019 1.005 - 1.030   pH 5.0 5.0 - 8.0   Glucose, UA NEGATIVE NEGATIVE mg/dL   Hgb urine dipstick NEGATIVE NEGATIVE   Bilirubin Urine NEGATIVE NEGATIVE   Ketones, ur NEGATIVE NEGATIVE mg/dL   Protein, ur NEGATIVE NEGATIVE mg/dL   Nitrite NEGATIVE NEGATIVE   Leukocytes,Ua TRACE (A) NEGATIVE   RBC / HPF 0-5 0 - 5 RBC/hpf   WBC, UA 0-5 0 - 5 WBC/hpf   Bacteria, UA NONE SEEN NONE SEEN   Squamous Epithelial / LPF 6-10 0 - 5   Mucus PRESENT    Dg Chest 2  View  Result Date: 02/08/2019 CLINICAL DATA:  Bilateral leg swelling for 2 days. EXAM: CHEST - 2 VIEW COMPARISON:  None. FINDINGS: The heart size and mediastinal contours are within normal limits. Both lungs are clear. The visualized skeletal structures are unremarkable. IMPRESSION: No active cardiopulmonary disease. Electronically Signed   By: Abelardo Diesel M.D.   On: 02/08/2019 18:54   Ct Abdomen Pelvis W Contrast  Result Date: 01/25/2019 CLINICAL DATA:  Abdominal pain and rectal bleeding for 2 weeks. Evaluate for diverticulitis. EXAM: CT ABDOMEN AND PELVIS WITH CONTRAST TECHNIQUE: Multidetector CT imaging of the abdomen and pelvis was performed using the standard protocol following bolus administration of intravenous contrast. CONTRAST:  189mL OMNIPAQUE IOHEXOL 300 MG/ML  SOLN COMPARISON:  October 27, 2005 FINDINGS: Lower chest: Minimal atelectasis of posterior lung bases are noted. The lungs are otherwise unremarkable. The  heart size is normal. Hepatobiliary: No focal liver lesion is identified. Minimal fatty infiltration of liver is identified near the falciform ligament. The gallbladder is normal. The biliary tree is normal. Pancreas: Unremarkable. No pancreatic ductal dilatation or surrounding inflammatory changes. Spleen: 1.5 cm low density identified in the spleen probably a cyst. This is unchanged compared to prior CT. No other focal splenic lesion is identified. Adrenals/Urinary Tract: Bilateral adrenal glands are normal. There is a 1 cm low-density lesion in the right midpole kidney probably a cyst. The left kidney is normal. There is no hydronephrosis bilaterally. Stomach/Bowel: Stomach is within normal limits. The appendix is not seen but no inflammation is noted around cecum. No evidence of bowel wall thickening, distention, or inflammatory changes. Vascular/Lymphatic: No significant vascular findings are present. No enlarged abdominal or pelvic lymph nodes. Reproductive: Uterus and bilateral  adnexa are unremarkable. Other: None Musculoskeletal: Degenerative joint changes of the spine are noted. IMPRESSION: No evidence of diverticulitis. No acute abnormality identified in the abdomen and pelvis. Electronically Signed   By: Abelardo Diesel M.D.   On: 01/25/2019 15:50   US Venous Img Lower Bilateral  Result Date: 02/08/2019 CLINICAL DATA:  Initial evaluation for acute bilateral leg swelling for 2 days. EXAM: BILATERAL LOWER EXTREMITY VENOUS DOPPLER ULTRASOUND TECHNIQUE: Gray-scale sonography with graded compression, as well as color Doppler and duplex ultrasound were performed to evaluate the lower extremity deep venous systems from the level of the common femoral vein and including the common femoral, femoral, profunda femoral, popliteal and calf veins including the posterior tibial, peroneal and gastrocnemius veins when visible. The superficial great saphenous vein was also interrogated. Spectral Doppler was utilized to evaluate flow at rest and with distal augmentation maneuvers in the common femoral, femoral and popliteal veins. COMPARISON:  None. FINDINGS: RIGHT LOWER EXTREMITY Common Femoral Vein: No evidence of thrombus. Normal compressibility, respiratory phasicity and response to augmentation. Saphenofemoral Junction: No evidence of thrombus. Normal compressibility and flow on color Doppler imaging. Profunda Femoral Vein: No evidence of thrombus. Normal compressibility and flow on color Doppler imaging. Femoral Vein: No evidence of thrombus. Normal compressibility, respiratory phasicity and response to augmentation. Popliteal Vein: No evidence of thrombus. Normal compressibility, respiratory phasicity and response to augmentation. Calf Veins: No evidence of thrombus. Normal compressibility and flow on color Doppler imaging. Superficial Great Saphenous Vein: No evidence of thrombus. Normal compressibility. Venous Reflux:  None. Other Findings:  None. LEFT LOWER EXTREMITY Common Femoral Vein: No  evidence of thrombus. Normal compressibility, respiratory phasicity and response to augmentation. Saphenofemoral Junction: No evidence of thrombus. Normal compressibility and flow on color Doppler imaging. Profunda Femoral Vein: No evidence of thrombus. Normal compressibility and flow on color Doppler imaging. Femoral Vein: No evidence of thrombus. Normal compressibility, respiratory phasicity and response to augmentation. Popliteal Vein: No evidence of thrombus. Normal compressibility, respiratory phasicity and response to augmentation. Calf Veins: No evidence of thrombus. Normal compressibility and flow on color Doppler imaging. Superficial Great Saphenous Vein: No evidence of thrombus. Normal compressibility. Venous Reflux:  None. Other Findings:  None. IMPRESSION: No evidence of deep venous thrombosis in either lower extremity. Electronically Signed   By: Jeannine Boga M.D.   On: 02/08/2019 19:20

## 2019-02-13 ENCOUNTER — Other Ambulatory Visit: Payer: Self-pay

## 2019-02-13 ENCOUNTER — Emergency Department
Admission: EM | Admit: 2019-02-13 | Discharge: 2019-02-13 | Discharge status: Against Medical Advice | Payer: Medicaid Other | Attending: Emergency Medicine | Admitting: Emergency Medicine

## 2019-02-13 ENCOUNTER — Encounter: Payer: Self-pay | Admitting: Emergency Medicine

## 2019-02-13 DIAGNOSIS — Z5321 Procedure and treatment not carried out due to patient leaving prior to being seen by health care provider: Secondary | ICD-10-CM | POA: Diagnosis not present

## 2019-02-13 DIAGNOSIS — R531 Weakness: Secondary | ICD-10-CM | POA: Insufficient documentation

## 2019-02-13 LAB — COMPREHENSIVE METABOLIC PANEL
ALT: 15 U/L (ref 0–44)
AST: 20 U/L (ref 15–41)
Albumin: 3.5 g/dL (ref 3.5–5.0)
Alkaline Phosphatase: 68 U/L (ref 38–126)
Anion gap: 11 (ref 5–15)
BUN: 14 mg/dL (ref 6–20)
CO2: 25 mmol/L (ref 22–32)
Calcium: 9.1 mg/dL (ref 8.9–10.3)
Chloride: 101 mmol/L (ref 98–111)
Creatinine, Ser: 1.11 mg/dL — ABNORMAL HIGH (ref 0.44–1.00)
GFR calc Af Amer: >60 mL/min (ref >60)
GFR calc non Af Amer: 60 mL/min — ABNORMAL LOW (ref >60)
Glucose, Bld: 103 mg/dL — ABNORMAL HIGH (ref 70–99)
Potassium: 4.1 mmol/L (ref 3.5–5.1)
Sodium: 137 mmol/L (ref 135–145)
Total Bilirubin: 0.6 mg/dL (ref 0.3–1.2)
Total Protein: 7.2 g/dL (ref 6.5–8.1)

## 2019-02-13 LAB — URINALYSIS, COMPLETE (UACMP) WITH MICROSCOPIC
Bacteria, UA: NONE SEEN
Bilirubin Urine: NEGATIVE
Glucose, UA: NEGATIVE mg/dL
Ketones, ur: NEGATIVE mg/dL
Nitrite: NEGATIVE
Protein, ur: NEGATIVE mg/dL
Specific Gravity, Urine: 1.016 (ref 1.005–1.030)
pH: 6 (ref 5.0–8.0)

## 2019-02-13 LAB — CBC WITH DIFFERENTIAL/PLATELET
Abs Immature Granulocytes: 0.07 10*3/uL (ref 0.00–0.07)
Basophils Absolute: 0.1 10*3/uL (ref 0.0–0.1)
Basophils Relative: 0 %
Eosinophils Absolute: 0.4 10*3/uL (ref 0.0–0.5)
Eosinophils Relative: 3 %
HCT: 35.6 % — ABNORMAL LOW (ref 36.0–46.0)
Hemoglobin: 10.9 g/dL — ABNORMAL LOW (ref 12.0–15.0)
Immature Granulocytes: 1 %
Lymphocytes Relative: 14 %
Lymphs Abs: 2 10*3/uL (ref 0.7–4.0)
MCH: 24.5 pg — ABNORMAL LOW (ref 26.0–34.0)
MCHC: 30.6 g/dL (ref 30.0–36.0)
MCV: 80 fL (ref 80.0–100.0)
Monocytes Absolute: 0.7 10*3/uL (ref 0.1–1.0)
Monocytes Relative: 5 %
Neutro Abs: 11.1 10*3/uL — ABNORMAL HIGH (ref 1.7–7.7)
Neutrophils Relative %: 77 %
Platelets: 446 10*3/uL — ABNORMAL HIGH (ref 150–400)
RBC: 4.45 MIL/uL (ref 3.87–5.11)
RDW: 14.6 % (ref 11.5–15.5)
WBC: 14.3 10*3/uL — ABNORMAL HIGH (ref 4.0–10.5)
nRBC: 0 % (ref 0.0–0.2)

## 2019-02-13 LAB — POCT PREGNANCY, URINE: Preg Test, Ur: NEGATIVE

## 2019-02-13 LAB — TROPONIN I (HIGH SENSITIVITY): Troponin I (High Sensitivity): 5 ng/L (ref <18)

## 2019-02-13 LAB — TYPE AND SCREEN
ABO/RH(D): A POS
Antibody Screen: NEGATIVE

## 2019-02-13 NOTE — ED Triage Notes (Signed)
Patient ambulatory to triage with steady gait, without difficulty or distress noted; pt reports seen last wk for rectal bleeding; st rectal and vag bleeding persists; st had colonoscopy performed but has not heard results yet; c/o weakness, dizziness

## 2019-02-13 NOTE — ED Notes (Signed)
Patient states unable to wait any longer, understands risks of leaving before having full evaluation done.  Pt ambulatory with steady gait, chest rise even and unlabored, in NAD at this time.

## 2019-02-15 ENCOUNTER — Telehealth: Payer: Self-pay | Admitting: Emergency Medicine

## 2019-02-15 NOTE — Telephone Encounter (Signed)
Called patient due to lwot to inquire about condition and follow up plans. Left message.   

## 2019-09-11 ENCOUNTER — Other Ambulatory Visit: Payer: Self-pay

## 2019-09-11 ENCOUNTER — Ambulatory Visit: Payer: Self-pay | Attending: Oncology | Admitting: *Deleted

## 2019-09-11 ENCOUNTER — Ambulatory Visit
Admission: RE | Admit: 2019-09-11 | Discharge: 2019-09-11 | Disposition: A | Discharge status: Home / Self Care | Payer: Self-pay | Source: Ambulatory Visit | Attending: Oncology | Admitting: Oncology

## 2019-09-11 VITALS — BP 131/68 | HR 59 | Temp 96.8°F | Ht 64.0 in | Wt 249.9 lb

## 2019-09-11 DIAGNOSIS — Z0000 Encounter for general adult medical examination without abnormal findings: Secondary | ICD-10-CM

## 2019-09-11 NOTE — Patient Instructions (Signed)
Gave patient hand-out, Women Staying Healthy, Active and Well from [facility], with education on breast health, pap smears, heart and colon health. 

## 2019-09-11 NOTE — Progress Notes (Signed)
  Subjective:     Patient ID: Linda Ware, female   DOB: July 20, 1970, 49 y.o.   MRN: [id number]  HPI   Review of Systems     Objective:   Physical Exam Chest:     Breasts:        Right: No swelling, bleeding, inverted nipple, mass, nipple discharge, skin change or tenderness.        Left: No swelling, bleeding, inverted nipple, mass, nipple discharge, skin change or tenderness.    Lymphadenopathy:     Upper Body:     Right upper body: No supraclavicular or axillary adenopathy.     Left upper body: No supraclavicular or axillary adenopathy.        Assessment:     49 year old female presents to [facility] for clinical breast exam and mammogram.  Last pap was on 08/17/19.  Patient states she does not have her results.  She is going to check Mychart for results.  Next pap due in 3-5 years.  Patient with a history of thyroid cancer in 2007.  States she did receive Iodine 1-31 for treatment.  She is in follow up with her primary care for thyroid levels every 6 months.  Clinical breast exam unremarkable.  Taught self breast awareness.  Patient has been screened for eligibility.  She does not have any insurance, Medicare or Medicaid.  She also meets financial eligibility.  See [name] breast cancer risk assessment. Risk Assessment    Risk Scores      09/11/2019   Last edited by: Orson Slick, CMA   5-year risk: 0.8 %   Lifetime risk: 8.4 %            Plan:     Screening mammogram ordered.   Will follow up per [facility] protocol.

## 2019-09-13 ENCOUNTER — Encounter: Payer: Self-pay | Admitting: *Deleted

## 2019-09-13 NOTE — Progress Notes (Signed)
Letter mailed from the [facility] to inform patient of her normal mammogram results.  Patient is to follow-up with annual screening in one year. 

## 2020-02-12 ENCOUNTER — Encounter (INDEPENDENT_AMBULATORY_CARE_PROVIDER_SITE_OTHER): Payer: Self-pay

## 2020-02-13 ENCOUNTER — Encounter: Payer: Self-pay | Admitting: Family Medicine

## 2020-02-13 ENCOUNTER — Ambulatory Visit (INDEPENDENT_AMBULATORY_CARE_PROVIDER_SITE_OTHER): Payer: 59 | Admitting: Family Medicine

## 2020-02-13 ENCOUNTER — Other Ambulatory Visit: Payer: Self-pay

## 2020-02-13 VITALS — BP 118/72 | HR 79 | Temp 97.5°F | Ht 63.5 in | Wt 252.5 lb

## 2020-02-13 DIAGNOSIS — Z7689 Persons encountering health services in other specified circumstances: Secondary | ICD-10-CM | POA: Diagnosis not present

## 2020-02-13 DIAGNOSIS — E039 Hypothyroidism, unspecified: Secondary | ICD-10-CM | POA: Diagnosis not present

## 2020-02-13 DIAGNOSIS — Z0000 Encounter for general adult medical examination without abnormal findings: Secondary | ICD-10-CM

## 2020-02-13 NOTE — Patient Instructions (Signed)
Good to meet you today  Please follow up when you are due for your thyroid blood work  I will get your records and review for any immediate concerns

## 2020-02-13 NOTE — Progress Notes (Signed)
Subjective:    Patient ID: Linda Ware, female    DOB: [birth year], 49 y.o.   MRN: [id number]  HPI Chief Complaint  Patient presents with  . New Patient (Initial Visit)  Lives with her husband. Has 2 kids at home, 82, 22 yo, 2 grown children.   Last CPE- within the last year Mammo- 09/11/2019 Pap- 08/17/2019 per patient, prior pcp, there was something wrong with the test, "not abnormal," and was told to repeat. She does not want to have today.  Colonoscopy- never Tdap- unknown Flu- not usually Covid 19 vaccine- not vaccinated Eye-annual Dental- regular Exercise- not regular Diet- has recently made an appointment with [facility]   History of Thyroid cancer- followed by PCP.   Crohn's disease- per patient. I am unable to find any documentation of prior colonoscopy or diagnosis. Frequent rectal bleeding. Was seeing GI at [facility] earlier this year, they had scheduled her for EGD and colonoscopy but these were not done due to insurance issues.  Patient reports that she is currently taking Asea supplement that she purchases from her neighbor.  She was given option of going on medication but was concerned about side effects.   Review of Systems  Constitutional: Negative.   HENT: Negative.   Eyes: Negative.   Respiratory: Negative.   Cardiovascular: Negative.   Gastrointestinal: Positive for blood in stool.  Endocrine: Negative.   Genitourinary: Positive for menstrual problem (irregular).  Musculoskeletal:       Occasional knee pain.   Neurological: Negative.   Hematological: Negative.   Psychiatric/Behavioral: Positive for sleep disturbance (difficulty staying asleep).       Objective:   Physical Exam Vitals reviewed.  Constitutional:      General: She is not in acute distress.    Appearance: Normal appearance. She is obese. She is not ill-appearing, toxic-appearing or diaphoretic.  HENT:     Head: Normocephalic and atraumatic.     Right Ear: Tympanic  membrane, ear canal and external ear normal.     Left Ear: Tympanic membrane, ear canal and external ear normal.     Nose: Nose normal.     Mouth/Throat:     Mouth: Mucous membranes are moist.     Pharynx: Oropharynx is clear.  Eyes:     Conjunctiva/sclera: Conjunctivae normal.  Cardiovascular:     Rate and Rhythm: Normal rate and regular rhythm.  Pulmonary:     Effort: Pulmonary effort is normal.     Breath sounds: Normal breath sounds.  Abdominal:     General: Abdomen is flat. Bowel sounds are normal.     Palpations: Abdomen is soft.  Musculoskeletal:     Right lower leg: No edema.     Left lower leg: No edema.  Skin:    General: Skin is warm and dry.  Neurological:     Mental Status: She is alert and oriented to person, place, and time.  Psychiatric:        Mood and Affect: Mood normal.        Behavior: Behavior normal.        Thought Content: Thought content normal.        Judgment: Judgment normal.       BP 118/72   Pulse 79   Temp (!) 97.5 F (36.4 C) (Temporal)   Ht 5' 3.5" (1.613 m)   Wt 252 lb 8 oz (114.5 kg)   SpO2 96%   BMI 44.03 kg/m  Wt Readings from Last 3  Encounters:  02/13/20 252 lb 8 oz (114.5 kg)  09/11/19 249 lb 14.4 oz (113.4 kg)  02/08/19 229 lb (103.9 kg)   Depression screen [facility] 2/9 02/13/2020 02/15/2013 10/07/2011  Decreased Interest 0 0 0  Down, Depressed, Hopeless 0 0 0  PHQ - 2 Score 0 0 0       Assessment & Plan:  1. Annual physical exam  - Discussed and encouraged healthy lifestyle choices- adequate sleep, regular exercise, stress management and healthy food choices.    2. Acquired hypothyroidism -Have asked her to request records from prior PCP -Follow-up as she normally would for scheduled lab work  3. Encounter to establish care -Patient declined to sign release of information to obtain records from prior PCP.  I believe these are necessary for continuity of care and I have sent her a MyChart message asking her to please  request records from her prior PCP through their office or to come in and fill out a release of information at my office.  4. Morbid obesity ([place]) -She has appointment next week gets [facility] and I am going to defer labs until she sees them as they typically do a full panel for new consults.  This visit occurred during the SARS-CoV-2 public health emergency.  Safety protocols were in place, including screening questions prior to the visit, additional usage of staff PPE, and extensive cleaning of exam room while observing appropriate contact time as indicated for disinfecting solutions.    Clarene Reamer, FNP-BC  [facility]  02/13/2020 1:39 PM

## 2020-02-21 ENCOUNTER — Encounter (INDEPENDENT_AMBULATORY_CARE_PROVIDER_SITE_OTHER): Payer: Self-pay | Admitting: Family Medicine

## 2020-02-21 ENCOUNTER — Other Ambulatory Visit: Payer: Self-pay

## 2020-02-21 ENCOUNTER — Ambulatory Visit (INDEPENDENT_AMBULATORY_CARE_PROVIDER_SITE_OTHER): Payer: 59 | Admitting: Family Medicine

## 2020-02-21 ENCOUNTER — Telehealth: Payer: Self-pay | Admitting: Family Medicine

## 2020-02-21 VITALS — BP 128/53 | HR 49 | Temp 98.0°F | Ht 64.0 in | Wt 249.0 lb

## 2020-02-21 DIAGNOSIS — Z9189 Other specified personal risk factors, not elsewhere classified: Secondary | ICD-10-CM | POA: Diagnosis not present

## 2020-02-21 DIAGNOSIS — R0602 Shortness of breath: Secondary | ICD-10-CM

## 2020-02-21 DIAGNOSIS — D649 Anemia, unspecified: Secondary | ICD-10-CM | POA: Diagnosis not present

## 2020-02-21 DIAGNOSIS — Z6841 Body Mass Index (BMI) 40.0 and over, adult: Secondary | ICD-10-CM

## 2020-02-21 DIAGNOSIS — E538 Deficiency of other specified B group vitamins: Secondary | ICD-10-CM | POA: Insufficient documentation

## 2020-02-21 DIAGNOSIS — R5383 Other fatigue: Secondary | ICD-10-CM | POA: Diagnosis not present

## 2020-02-21 DIAGNOSIS — Z85850 Personal history of malignant neoplasm of thyroid: Secondary | ICD-10-CM

## 2020-02-21 DIAGNOSIS — E559 Vitamin D deficiency, unspecified: Secondary | ICD-10-CM

## 2020-02-21 DIAGNOSIS — F3289 Other specified depressive episodes: Secondary | ICD-10-CM

## 2020-02-21 DIAGNOSIS — Z0289 Encounter for other administrative examinations: Secondary | ICD-10-CM

## 2020-02-21 DIAGNOSIS — G4733 Obstructive sleep apnea (adult) (pediatric): Secondary | ICD-10-CM

## 2020-02-21 DIAGNOSIS — E890 Postprocedural hypothyroidism: Secondary | ICD-10-CM

## 2020-02-21 DIAGNOSIS — J4520 Mild intermittent asthma, uncomplicated: Secondary | ICD-10-CM

## 2020-02-21 DIAGNOSIS — F39 Unspecified mood [affective] disorder: Secondary | ICD-10-CM | POA: Insufficient documentation

## 2020-02-21 NOTE — Telephone Encounter (Signed)
Pt called stating she went to [facility] today and they recommend pt to get a sleep study  And wanted you to do the referral.  Pt would like first available appointment with whoever you recommend

## 2020-02-22 LAB — CBC WITH DIFFERENTIAL/PLATELET
Basophils Absolute: 0 10*3/uL (ref 0.0–0.2)
Basos: 0 %
EOS (ABSOLUTE): 0.2 10*3/uL (ref 0.0–0.4)
Eos: 2 %
Hematocrit: 39.1 % (ref 34.0–46.6)
Hemoglobin: 12.1 g/dL (ref 11.1–15.9)
Immature Grans (Abs): 0 10*3/uL (ref 0.0–0.1)
Immature Granulocytes: 0 %
Lymphocytes Absolute: 2 10*3/uL (ref 0.7–3.1)
Lymphs: 19 %
MCH: 24.8 pg — ABNORMAL LOW (ref 26.6–33.0)
MCHC: 30.9 g/dL — ABNORMAL LOW (ref 31.5–35.7)
MCV: 80 fL (ref 79–97)
Monocytes Absolute: 0.6 10*3/uL (ref 0.1–0.9)
Monocytes: 5 %
Neutrophils Absolute: 7.5 10*3/uL — ABNORMAL HIGH (ref 1.4–7.0)
Neutrophils: 74 %
Platelets: 315 10*3/uL (ref 150–450)
RBC: 4.88 x10E6/uL (ref 3.77–5.28)
RDW: 13.3 % (ref 11.7–15.4)
WBC: 10.3 10*3/uL (ref 3.4–10.8)

## 2020-02-22 LAB — COMPREHENSIVE METABOLIC PANEL
ALT: 25 IU/L (ref 0–32)
AST: 21 IU/L (ref 0–40)
Albumin/Globulin Ratio: 1.5 (ref 1.2–2.2)
Albumin: 4.1 g/dL (ref 3.8–4.8)
Alkaline Phosphatase: 74 IU/L (ref 44–121)
BUN/Creatinine Ratio: 11 (ref 9–23)
BUN: 10 mg/dL (ref 6–24)
Bilirubin Total: 0.5 mg/dL (ref 0.0–1.2)
CO2: 23 mmol/L (ref 20–29)
Calcium: 9.2 mg/dL (ref 8.7–10.2)
Chloride: 100 mmol/L (ref 96–106)
Creatinine, Ser: 0.88 mg/dL (ref 0.57–1.00)
GFR calc Af Amer: 90 mL/min/{1.73_m2} (ref >59)
GFR calc non Af Amer: 78 mL/min/{1.73_m2} (ref >59)
Globulin, Total: 2.7 g/dL (ref 1.5–4.5)
Glucose: 77 mg/dL (ref 65–99)
Potassium: 4.6 mmol/L (ref 3.5–5.2)
Sodium: 139 mmol/L (ref 134–144)
Total Protein: 6.8 g/dL (ref 6.0–8.5)

## 2020-02-22 LAB — HEMOGLOBIN A1C
Est. average glucose Bld gHb Est-mCnc: 117 mg/dL
Hgb A1c MFr Bld: 5.7 % — ABNORMAL HIGH (ref 4.8–5.6)

## 2020-02-22 LAB — LIPID PANEL
Chol/HDL Ratio: 2.8 ratio (ref 0.0–4.4)
Cholesterol, Total: 172 mg/dL (ref 100–199)
HDL: 62 mg/dL (ref >39)
LDL Chol Calc (NIH): 84 mg/dL (ref 0–99)
Triglycerides: 149 mg/dL (ref 0–149)
VLDL Cholesterol Cal: 26 mg/dL (ref 5–40)

## 2020-02-22 LAB — VITAMIN D 25 HYDROXY (VIT D DEFICIENCY, FRACTURES): Vit D, 25-Hydroxy: 37.5 ng/mL (ref 30.0–100.0)

## 2020-02-22 LAB — T3: T3, Total: 137 ng/dL (ref 71–180)

## 2020-02-22 LAB — FOLATE: Folate: 18.3 ng/mL (ref >3.0)

## 2020-02-22 LAB — TSH: TSH: 0.557 u[IU]/mL (ref 0.450–4.500)

## 2020-02-22 LAB — T4: T4, Total: 16.7 ug/dL — ABNORMAL HIGH (ref 4.5–12.0)

## 2020-02-22 LAB — INSULIN, RANDOM: INSULIN: 12.1 u[IU]/mL (ref 2.6–24.9)

## 2020-02-22 LAB — VITAMIN B12: Vitamin B-12: 599 pg/mL (ref 232–1245)

## 2020-02-24 ENCOUNTER — Other Ambulatory Visit: Payer: Self-pay | Admitting: Family Medicine

## 2020-02-24 DIAGNOSIS — G4730 Sleep apnea, unspecified: Secondary | ICD-10-CM

## 2020-02-24 DIAGNOSIS — G4710 Hypersomnia, unspecified: Secondary | ICD-10-CM

## 2020-02-24 NOTE — Progress Notes (Signed)
Order placed for pulmonary referral at the request of patient per [facility]

## 2020-02-24 NOTE — Telephone Encounter (Signed)
Please call patient and tell her that I sent her a mychart message after her visit with me regarding requesting medical records from her prior PCP as they are not in Epic. It doesn't look like she has read the message. It is important for me to get the records in order to provide care for her. Please see if she wants to stop by or mail in record release for previous PCP. I have placed pulmonary referral for evaluation of sleep apnea.

## 2020-02-25 NOTE — Progress Notes (Signed)
Chief Complaint:   OBESITY Linda Ware (MR# [id number]) is a 49 y.o. female who presents for evaluation and treatment of obesity and related comorbidities. Current BMI is Body mass index is 42.74 kg/m. Linda Ware has been struggling with her weight for many years and has been unsuccessful in either losing weight, maintaining weight loss, or reaching her healthy weight goal.  Linda Ware is currently in the action stage of change and ready to dedicate time achieving and maintaining a healthier weight. Linda Ware is interested in becoming our patient and working on intensive lifestyle modifications including (but not limited to) diet and exercise for weight loss.  Linda Ware lives with her husband, Linda Ware, and their 2 children, ages 28 (55) and 78 Linda Ware).  She works full time as a Secondary school teacher.  She lost 112 pounds in the past in 1 year using HCG diet (homeopathic drops she bought online).  She felt that work best for her in the past, along with keto.  She eats out most days of the week.  She eats "convenience foods" alot along with fast foods and sweets (pre-packaged foods).  Linda Ware's habits were reviewed today and are as follows: Her family eats meals together, she thinks her family will eat healthier with her, she struggles with family and or coworkers weight loss sabotage, her desired weight loss is 79 pounds, she has been heavy most of her life, she started gaining weight in middle school, she craves convenience foods, fast food, sweets, she frequently makes poor food choices, she frequently eats larger portions than normal and she struggles with emotional eating.  This is the patient[facility] visit at [facility].  The patient's NEW PATIENT PACKET that they filled out prior to today's office visit was reviewed at length and information from that paperwork was included within the following office visit note.    Included in the packet: current and past health history, medications, allergies,  ROS, gynecologic history (women only), surgical history, family history, social history, weight history, weight loss surgery history (for those that have had weight loss surgery), nutritional evaluation, mood and food questionnaire along with a depression screening (PHQ9) on all patients, an Epworth questionnaire, sleep habits questionnaire, patient life and health improvement goals questionnaire. These will all be scanned into the patient's chart under media.   During the visit, I independently reviewed the patient's EKG, bioimpedance scale results, and indirect calorimeter results. I used this information to tailor a meal plan for the patient that will help Linda Ware to lose weight and will improve her obesity-related conditions going forward.  I performed a medically necessary appropriate examination and/or evaluation. I discussed the assessment and treatment plan with the patient. The patient was provided an opportunity to ask questions and all were answered. The patient agreed with the plan and demonstrated an understanding of the instructions. Labs were ordered today (unless patient declined them) and will be reviewed with the patient at our next visit unless more critical results need to be addressed immediately. Clinical information was updated and documented in the EMR.  Time spent on visit including pre-visit chart review and post-visit care was estimated to be 60-74 minutes.  A separate 15 minutes was spent on risk counseling (see above/below).   Depression Screen Linda Ware's Food and Mood (modified PHQ-9) score was 21.  Depression screen PHQ 2/9 02/21/2020  Decreased Interest 3  Down, Depressed, Hopeless 3  PHQ - 2 Score 6  Altered sleeping 3  Tired, decreased energy 2  Change  in appetite 3  Feeling bad or failure about yourself  3  Trouble concentrating 3  Moving slowly or fidgety/restless 0  Suicidal thoughts 1  PHQ-9 Score 21  Difficult doing work/chores Somewhat difficult    Assessment/Plan:   1. Other fatigue Linda Ware admits to daytime somnolence and reports waking up still tired. Patent has a history of symptoms of daytime fatigue, morning fatigue, morning headache and snoring. Linda Ware generally gets 5 or 6 hours of sleep per night, and states that she has poor quality sleep. Snoring is present. Apneic episodes are present. Epworth Sleepiness Score is 16.  Linda Ware does feel that her weight is causing her energy to be lower than it should be. Fatigue may be related to obesity, depression or many other causes. Labs will be ordered, and in the meanwhile, Linda Ware will focus on self care including making healthy food choices, increasing physical activity and focusing on stress reduction.  - EKG 12-Lead - CBC with Differential/Platelet - Comprehensive metabolic panel - Hemoglobin A1c - Insulin, random - Lipid panel  2. SOB (shortness of breath) Linda Ware notes increasing shortness of breath with exercising and seems to be worsening over time with weight gain. She notes getting out of breath sooner with activity than she used to. This has gotten worse recently. Linda Ware denies shortness of breath at rest or orthopnea.  Linda Ware does feel that she gets out of breath more easily that she used to when she exercises. Linda Ware's shortness of breath appears to be obesity related and exercise induced. She has agreed to work on weight loss and gradually increase exercise to treat her exercise induced shortness of breath. Will continue to monitor closely.  3. Mild intermittent asthma without complication Linda Ware uses Linda Ware for asthma.  She has albuterol available for rescue.  She uses it less than once a week.  Plan:  Continue Linda Ware.  Continue albuterol as needed for rescue.  4. Anemia, unspecified type Linda Ware is not a vegetarian.  She does not have a history of weight loss surgery.  She endorses fatigue.  Plan:  Will check labs today.  She is currently taking an iron supplement 325 mg  daily.  CBC Latest Ref Rng & Units 02/21/2020 02/13/2019 02/08/2019  WBC 3.4 - 10.8 x10E3/uL 10.3 14.3(H) 14.1(H)  Hemoglobin 11.1 - 15.9 g/dL 12.1 10.9(L) 9.3(L)  Hematocrit 34.0 - 46.6 % 39.1 35.6(L) 30.4(L)  Platelets 150 - 450 x10E3/uL 315 446(H) 370   Lab Results  Component Value Date   VITAMINB12 599 02/21/2020   - Vitamin B12 - CBC with Differential/Platelet - Folate  5. Vitamin D deficiency Linda Ware P [place] has a history of Vitamin D deficiency with resultant generalized fatigue as her primary symptom.  she is taking OTC vitamin D 5,000 IU daily for this deficiency and tolerating it well without side-effect.    Plan:   - Discussed importance of vitamin D (as well as calcium) to their health and well-being.   - We reviewed possible symptoms of low Vitamin D including low energy, depressed mood, muscle aches, joint aches, osteoporosis etc.  - We discussed that low Vitamin D levels may be linked to an increased risk of cardiovascular events and even increased risk of cancers- such as colon and breast.   - Educated pt that weight loss will likely improve availability of vitamin D, thus encouraged Linda Ware to continue with meal plan and their weight loss efforts to further improve this condition   - Informed patient this may be a lifelong thing, and  she was encouraged to continue to take the medicine until pt told otherwise.   We will need to monitor levels regularly ( q 3-4 mo on average )  to keep levels within normal limits.   - All pt's questions and concerns regarding this condition addressed  -Will check vitamin D level today.  Continue OTC vitamin D supplement at this time.  - VITAMIN D 25 Hydroxy (Vit-D Deficiency, Fractures)  6. Vitamin B12 deficiency She notes fatigue. She is not a vegetarian.  She does not have a previous diagnosis of pernicious anemia.  She does not have a history of weight loss surgery.    Plan:  Will check vitamin B12 level today.  Lab Results   Component Value Date   VITAMINB12 599 02/21/2020   - Vitamin B12  7. OSA (obstructive sleep apnea) Linda Ware has a diagnosis of sleep apnea. She reports that she is not using a CPAP regularly.  She says that upon retest, they told her she did not need a CPAP machine.  Epworth sleepiness score is 16.  Plan:  I advised her to follow-up with her PCP/Sleep Medicine doctor regarding having better control of her symptoms/possibly go back on CPAP.  8. History of thyroid cancer With history of thyroidectomy.  She is taking Synthroid 200 mcg daily.  Plan:  Check thyroid levels today.  - T3 - T4 - TSH  9. Other depression, with emotional eating She denies anxiety.  Endorses depression.  PHQ-9 is 21 today.  She recently contacted her old Social worker and is going to restart counseling.  She has never been on a mood medicine and does not feel she needs one.  Denies SI/HI.  Plan:  Follow-up with counselor every 2 weeks.  She declines medication. - Mood is not well controlled currently.  No SI  - Counseled patient on pathophysiology of disease and discussed various treatment options, which often includes dietary and lifestyle modifications as first line, in addition to discussing the risks and benefits of various medications.   - In addition to possible prescription interventions, reviewed the "spokes of the wheel" of mood and health management.   -Stressed the importance of ongoing prudent health habits, including regular exercise, appropriate sleep hygiene, healthy dietary habits, prayer/ meditation to help with mood stability and seeking the help of a professional counselor discussed.    - Encouraged patient to work on simplifying life, setting better boundaries with others and minimizing sources of stress  - She was asked to contact us with any worsening in symptoms or suicidal thoughts and we discussed that it would take 2-4 weeks to begin to see improvement in her symptoms.   - Anticipatory  guidance given and extensive counseling and education provided to patient today.  All questions answered.    10. At risk for impaired metabolic function Due to Mateya's current state of health and medical condition(s), they are at a significantly higher risk for impaired metabolic function.   This further also puts the patient at much greater risk to also subsequently develop cardiopulmonary conditions that can negatively affect patient's quality of life as well.  At least 20 minutes was spent on counseling Linda Ware about these concerns today and I stressed the importance of reversing these risks factors.   Initial goal is to lose at least 5-10% of starting weight to help reduce risk factors.   Counseling: Intensive lifestyle modifications discussed with [place] as most appropriate first line treatment.  she will continue to work on diet, exercise and  weight loss efforts.  We will continue to reassess these conditions on a fairly regular basis in an attempt to decrease patient's overall morbidity and mortality  11. Class 3 severe obesity with serious comorbidity and body mass index (BMI) of 40.0 to 44.9 in adult, unspecified obesity type (HCC)  Linda Ware is currently in the action stage of change and her goal is to continue with weight loss efforts. I recommend [place] begin the structured treatment plan as follows:  She has agreed to the Category 2 Plan.  Exercise goals: As is.   Behavioral modification strategies: increasing lean protein intake, decreasing simple carbohydrates, decreasing eating out, no skipping meals, keeping healthy foods in the home and planning for success.  She was informed of the importance of frequent follow-up visits to maximize her success with intensive lifestyle modifications for her multiple health conditions. She was informed we would discuss her lab results at her next visit unless there is a critical issue that needs to be addressed sooner. Linda Ware agreed to keep her next visit  at the agreed upon time to discuss these results.  Objective:   Blood pressure (!) 128/53, pulse (!) 49, temperature 98 F (36.7 C), height 5\' 4"  (1.626 m), weight 249 lb (112.9 kg), SpO2 97 %. Body mass index is 42.74 kg/m.  EKG: Normal sinus rhythm, rate 59 bpm.  Indirect Calorimeter completed today shows a VO2 of 305 and a REE of 2121.  Her calculated basal metabolic rate is 8921 thus her basal metabolic rate is better than expected.  General: Cooperative, alert, well developed, in no acute distress. HEENT: Conjunctivae and lids unremarkable. Cardiovascular: Regular rhythm.  Lungs: Normal work of breathing. Neurologic: No focal deficits.   Lab Results  Component Value Date   CREATININE 0.88 02/21/2020   BUN 10 02/21/2020   NA 139 02/21/2020   K 4.6 02/21/2020   CL 100 02/21/2020   CO2 23 02/21/2020   Lab Results  Component Value Date   ALT 25 02/21/2020   AST 21 02/21/2020   ALKPHOS 74 02/21/2020   BILITOT 0.5 02/21/2020   Lab Results  Component Value Date   HGBA1C 5.7 (H) 02/21/2020   Lab Results  Component Value Date   INSULIN 12.1 02/21/2020   Lab Results  Component Value Date   TSH 0.557 02/21/2020   Lab Results  Component Value Date   CHOL 172 02/21/2020   HDL 62 02/21/2020   LDLCALC 84 02/21/2020   TRIG 149 02/21/2020   CHOLHDL 2.8 02/21/2020   Lab Results  Component Value Date   WBC 10.3 02/21/2020   HGB 12.1 02/21/2020   HCT 39.1 02/21/2020   MCV 80 02/21/2020   PLT 315 02/21/2020   Attestation Statements:   Reviewed by clinician on day of visit: allergies, medications, problem list, medical history, surgical history, family history, social history, and previous encounter notes.  I, Water quality scientist, CMA, am acting as Location manager for [employer], DO.  I have reviewed the above documentation for accuracy and completeness, and I agree with the above. Marjory Sneddon, D.O.  The [place] was signed into law in 2016  which includes the topic of electronic health records.  This provides immediate access to information in MyChart.  This includes consultation notes, operative notes, office notes, lab results and pathology reports.  If you have any questions about what you read please let us know at your next visit so we can discuss your concerns and take corrective action  if need be.  We are right here with you.

## 2020-02-25 NOTE — Telephone Encounter (Signed)
Called and spoke with patient regarding obtaining medical records from prior PCP and importance for continuity of health care. Explained that by my requesting records, it does not cut her ties with her previous practice. I also explained that I am unwilling to continue to care for her if she is not agreeable to getting records. She stated that she would return ROI.

## 2020-02-25 NOTE — Telephone Encounter (Signed)
Spoke with pt regarding records from PCP.  She stated she could give you her last labs.  But she wasn't ready to break ties with them yet.  They dont take her insurance at this time.  She is aware that she cannot go to two PCP.  I mailed medical records release form to pt

## 2020-02-26 NOTE — Telephone Encounter (Signed)
I just wanted to clarify, will patient be returning the signed ROI in order for [place] to get needed records and to continue care here?

## 2020-02-27 NOTE — Telephone Encounter (Signed)
She stated she would return it. I have a reminder set to see if she has returned it in one month.

## 2020-03-06 ENCOUNTER — Ambulatory Visit (INDEPENDENT_AMBULATORY_CARE_PROVIDER_SITE_OTHER): Payer: 59 | Admitting: Family Medicine

## 2020-03-06 ENCOUNTER — Encounter (INDEPENDENT_AMBULATORY_CARE_PROVIDER_SITE_OTHER): Payer: Self-pay | Admitting: Family Medicine

## 2020-03-06 ENCOUNTER — Other Ambulatory Visit: Payer: Self-pay

## 2020-03-06 VITALS — BP 109/73 | HR 70 | Temp 98.6°F | Ht 64.0 in | Wt 242.0 lb

## 2020-03-06 DIAGNOSIS — Z6841 Body Mass Index (BMI) 40.0 and over, adult: Secondary | ICD-10-CM

## 2020-03-06 DIAGNOSIS — E038 Other specified hypothyroidism: Secondary | ICD-10-CM | POA: Diagnosis not present

## 2020-03-06 DIAGNOSIS — E559 Vitamin D deficiency, unspecified: Secondary | ICD-10-CM

## 2020-03-06 DIAGNOSIS — R7303 Prediabetes: Secondary | ICD-10-CM | POA: Diagnosis not present

## 2020-03-06 DIAGNOSIS — Z9189 Other specified personal risk factors, not elsewhere classified: Secondary | ICD-10-CM | POA: Diagnosis not present

## 2020-03-06 DIAGNOSIS — D508 Other iron deficiency anemias: Secondary | ICD-10-CM | POA: Diagnosis not present

## 2020-03-06 DIAGNOSIS — D649 Anemia, unspecified: Secondary | ICD-10-CM | POA: Insufficient documentation

## 2020-03-06 MED ORDER — VITAMIN D3 125 MCG (5000 UT) PO TABS: 5000.0000 [IU] | ORAL_TABLET | Freq: Every day | ORAL | Status: DC

## 2020-03-06 MED ORDER — VITAMIN D (ERGOCALCIFEROL) 1.25 MG (50000 UNIT) PO CAPS: 50000.0000 [IU] | ORAL_CAPSULE | ORAL | 0 refills | Status: DC

## 2020-03-10 ENCOUNTER — Encounter (INDEPENDENT_AMBULATORY_CARE_PROVIDER_SITE_OTHER): Payer: Self-pay | Admitting: Family Medicine

## 2020-03-10 NOTE — Progress Notes (Signed)
Chief Complaint:   OBESITY Linda Ware is here to discuss her progress with her obesity treatment plan along with follow-up of her obesity related diagnoses. Linda Ware is on the Category 2 Plan and states she is following her eating plan approximately 100% of the time. Linda Ware states she is not exercising at this time.  Today's visit was #: 2 Starting weight: 249 lbs Starting date: 02/21/2020 Today's weight: 242 lbs Today's date: 03/06/2020 Total lbs lost to date: 7 lbs Total lbs lost since last in-office visit: 7 lbs Total weight loss percentage to date: -2.81%  Interim History: Linda Ware says that the last 2 weeks went well.  "I don't feel hungry or anything.  I see this as something I can do for life.  I don't feel deprived at all."  She has been meal planning a lot and bringing foods with her.   Assessment/Plan:   1. Prediabetes New.  Discussed labs with patient today.  Linda Ware has a diagnosis of prediabetes based on her elevated HgA1c and was informed this puts her at greater risk of developing diabetes. She continues to work on diet and exercise to decrease her risk of diabetes. She denies nausea or hypoglycemia.     Plan:  Extensive education was done regarding disease process, lifestyle modification, and dietary modification needed to prevent diabetes.  Recheck labs in 3 months.   Lab Results  Component Value Date   HGBA1C 5.7 (H) 02/21/2020   Lab Results  Component Value Date   INSULIN 12.1 02/21/2020     2. Other specified hypothyroidism Discussed labs with patient today.  Linda Ware is taking levothyroxine 200 mcg daily.  Denies symptoms of hyperthyroidism.  She had experienced these in the past but denies them now.  Plan:  Normal TSH.  Continue current dose.  If she develops symptoms, she will let us know.  Recheck in 3 months.  Lab Results  Component Value Date   TSH 0.557 02/21/2020     3. Other iron deficiency anemia Discussed labs with patient today.  Linda Ware is taking  ferrous sulfate 325 mg daily.  Plan:  Stable labs.  Continue OTC ferrous sulfate daily.   CBC Latest Ref Rng & Units 02/21/2020 02/13/2019 02/08/2019  WBC 3.4 - 10.8 x10E3/uL 10.3 14.3(H) 14.1(H)  Hemoglobin 11.1 - 15.9 g/dL 12.1 10.9(L) 9.3(L)  Hematocrit 34.0 - 46.6 % 39.1 35.6(L) 30.4(L)  Platelets 150 - 450 x10E3/uL 315 446(H) 370   No results found for: IRON, TIBC, FERRITIN Lab Results  Component Value Date   VITAMINB12 599 02/21/2020     4. Vitamin D deficiency Linda Ware has a history of Vitamin D deficiency with resultant generalized fatigue as her primary symptom.  she is taking 5,000 IU daily for this deficiency and tolerating it well without side-effect.  Most recent Vitamin D lab reviewed-  level: 37.5 on 02/21/2020.  Plan:  - Discussed importance of vitamin D (as well as calcium) to their health and wellbeing.   - We reviewed possible symptoms of low Vitamin D including low energy, depressed mood, muscle aches, joint aches, osteoporosis, etc.  - We discussed that low Vitamin D levels may be linked to an increased risk of cardiovascular events, and even increased risk of cancers, such as colon and breast.   - Educated pt that weight loss will likely improve availability of vitamin D, thus encouraged Linda Ware to continue with meal plan and their weight loss efforts to further improve this condition.  - I recommend pt take weekly  prescription vit D 50,000 IU and OTC vitamin D 5,000 IU daily - see script below- which pt agrees to after discussion of the risks and benefits of this medication.      - Informed patient this may be a lifelong thing, and she was encouraged to continue to take the medicine until told otherwise.  We will need to monitor levels regularly (every 3-4 mo on average) to keep levels within normal limits.   - All pt's questions and concerns regarding this condition addressed.  -Start Cholecalciferol (VITAMIN D3) 125 MCG (5000 UT) TABS; Take 1 tablet (5,000 Units  total) OTC by mouth daily.  Dispense: 30 tablet  -Start Vitamin D, Ergocalciferol, (DRISDOL) 1.25 MG (50000 UNIT) CAPS capsule; Take 1 capsule (50,000 Units total) by mouth every 7 (seven) days.  Dispense: 4 capsule; Refill: 0    5. At risk for diabetes mellitus - Linda Ware was given extensive diabetes prevention education and counseling today of more than 26 minutes.  - Counseled patient on pathophysiology of disease and discussed various treatment options which always includes dietary and lifestyle modification as first line.   - Importance of healthy diet with very limited amounts of simple carbohydrates discussed with patient in addition to regular aerobic exercise to an eventual goal of 45min 5d/week or more.  - Handouts provided at patient's desire and or told to go online at the American Diabetes Association website for further information    6. Class 3 severe obesity with serious comorbidity and body mass index (BMI) of 40.0 to 44.9 in adult, unspecified obesity type (HCC)  Linda Ware is currently in the action stage of change. As such, her goal is to continue with weight loss efforts. She has agreed to the Category 2 Plan.   Exercise goals: Walking for 10 minutes 3 days per week and stretching extremities for 10 minutes 3 days per week.  Behavioral modification strategies: decreasing liquid calories, meal planning and cooking strategies, keeping healthy foods in the home and planning for success.  Calm app - use mindfulness eating service.  Linda Ware has agreed to follow-up with our clinic in 2 weeks. She was informed of the importance of frequent follow-up visits to maximize her success with intensive lifestyle modifications for her multiple health conditions.     Objective:   Blood pressure 109/73, pulse 70, temperature 98.6 F (37 C), height 5\' 4"  (1.626 m), weight 242 lb (109.8 kg), SpO2 98 %. Body mass index is 41.54 kg/m.  General: Cooperative, alert, well developed, in no acute  distress. HEENT: Conjunctivae and lids unremarkable. Cardiovascular: Regular rhythm.  Lungs: Normal work of breathing. Neurologic: No focal deficits.   Lab Results  Component Value Date   CREATININE 0.88 02/21/2020   BUN 10 02/21/2020   NA 139 02/21/2020   K 4.6 02/21/2020   CL 100 02/21/2020   CO2 23 02/21/2020   Lab Results  Component Value Date   ALT 25 02/21/2020   AST 21 02/21/2020   ALKPHOS 74 02/21/2020   BILITOT 0.5 02/21/2020   Lab Results  Component Value Date   HGBA1C 5.7 (H) 02/21/2020   Lab Results  Component Value Date   INSULIN 12.1 02/21/2020   Lab Results  Component Value Date   TSH 0.557 02/21/2020   Lab Results  Component Value Date   CHOL 172 02/21/2020   HDL 62 02/21/2020   LDLCALC 84 02/21/2020   TRIG 149 02/21/2020   CHOLHDL 2.8 02/21/2020   Lab Results  Component Value Date  WBC 10.3 02/21/2020   HGB 12.1 02/21/2020   HCT 39.1 02/21/2020   MCV 80 02/21/2020   PLT 315 02/21/2020   Attestation Statements:   Reviewed by clinician on day of visit: allergies, medications, problem list, medical history, surgical history, family history, social history, and previous encounter notes.  I, Water quality scientist, CMA, am acting as Location manager for [employer], DO.  I have reviewed the above documentation for accuracy and completeness, and I agree with the above. Marjory Sneddon, D.O.  The [place] was signed into law in 2016 which includes the topic of electronic health records.  This provides immediate access to information in MyChart.  This includes consultation notes, operative notes, office notes, lab results and pathology reports.  If you have any questions about what you read please let us know at your next visit so we can discuss your concerns and take corrective action if need be.  We are right here with you.

## 2020-03-13 NOTE — Telephone Encounter (Signed)
Can you check on her referral to pulmonary? It was placed 10/17. Thanks.

## 2020-03-20 ENCOUNTER — Ambulatory Visit (INDEPENDENT_AMBULATORY_CARE_PROVIDER_SITE_OTHER): Payer: 59 | Admitting: Family Medicine

## 2020-03-20 ENCOUNTER — Other Ambulatory Visit: Payer: Self-pay

## 2020-03-20 ENCOUNTER — Encounter (INDEPENDENT_AMBULATORY_CARE_PROVIDER_SITE_OTHER): Payer: Self-pay | Admitting: Family Medicine

## 2020-03-20 VITALS — BP 104/66 | HR 53 | Temp 98.1°F | Ht 64.0 in | Wt 240.0 lb

## 2020-03-20 DIAGNOSIS — F3289 Other specified depressive episodes: Secondary | ICD-10-CM | POA: Diagnosis not present

## 2020-03-20 DIAGNOSIS — E559 Vitamin D deficiency, unspecified: Secondary | ICD-10-CM

## 2020-03-20 DIAGNOSIS — Z9189 Other specified personal risk factors, not elsewhere classified: Secondary | ICD-10-CM | POA: Diagnosis not present

## 2020-03-20 DIAGNOSIS — Z6841 Body Mass Index (BMI) 40.0 and over, adult: Secondary | ICD-10-CM | POA: Diagnosis not present

## 2020-03-20 DIAGNOSIS — F32A Depression, unspecified: Secondary | ICD-10-CM | POA: Insufficient documentation

## 2020-03-20 MED ORDER — VITAMIN D (ERGOCALCIFEROL) 1.25 MG (50000 UNIT) PO CAPS: 50000.0000 [IU] | ORAL_CAPSULE | ORAL | 0 refills | Status: DC

## 2020-03-25 NOTE — Progress Notes (Signed)
Chief Complaint:   OBESITY Linda Ware is here to discuss her progress with her obesity treatment plan along with follow-up of her obesity related diagnoses. Linda Ware is on the Category 2 Plan and states she is following her eating plan approximately 98% of the time. Linda Ware states she is stretching for 10 minutes 3-4 times per week.  Today's visit was #: 3 Starting weight: 249 lbs Starting date: 02/21/2020 Today's weight: 240 lbs Today's date: 03/20/2020 Total lbs lost to date: 9 lbs Total lbs lost since last in-office visit: 2 lbs Total weight loss percentage to date: -3.61%  Interim History: Linda Ware says that the last 2 weeks went well.  She feels that her clothes are looser and fit better.  She added some stretching.  She is still not drinking enough water.    Assessment/Plan:   Meds ordered this encounter  Medications  . Vitamin D, Ergocalciferol, (DRISDOL) 1.25 MG (50000 UNIT) CAPS capsule    Sig: Take 1 capsule (50,000 Units total) by mouth every 7 (seven) days.    Dispense:  4 capsule    Refill:  0   No orders of the defined types were placed in this encounter.   1. Vitamin D deficiency Linda Ware's Vitamin D level was 37.5 on 02/21/2020. She is currently taking prescription vitamin D 50,000 IU each week. She denies nausea, vomiting or muscle weakness.  Started at last office visit.  Tolerating well without side effects.  Feeling better with more energy than usual.  She says she is not as tired.  -Refill Vitamin D, Ergocalciferol, (DRISDOL) 1.25 MG (50000 UNIT) CAPS capsule; Take 1 capsule (50,000 Units total) by mouth every 7 (seven) days.  Dispense: 4 capsule; Refill: 0   2. Other depression, with emotional eating Her counselor, whom she sees once a week, recommended the "Open app".  She says she is feeling much better.  Plan:  Continue with counselor.  Discussed mindful eating practices to be done QD.   Recommend intuitive eating workbook and advised to add exercise.   3.  At risk for osteoporosis Linda Ware was given approximately 9 minutes of osteoporosis prevention counseling today.  Linda Ware is at risk for osteopenia and osteoporosis due to Vitamin D deficiency, as well as other risk factors.  We discussed the importance of prudent screenings through her PCP's office for prevention.  Linda Ware was encouraged to take her Vitamin D and follow her calcium rich diet.  We will continue to monitor vitamin D levels to ensure treatment is appropriate.  It is recommended that she eventually engage in weight bearing exercises and muscle strengthening exercises to help improve bone density and decrease her risk of osteopenia and osteoporosis.   4. Class 3 severe obesity with serious comorbidity and body mass index (BMI) of 40.0 to 44.9 in adult, unspecified obesity type (HCC)  Linda Ware is currently in the action stage of change. As such, her goal is to continue with weight loss efforts. She has agreed to the Category 2 Plan.   Exercise goals: She will walk for 10 minutes 3 days per week in addition to stretching.  Behavioral modification strategies: increasing lean protein intake, increasing water intake, emotional eating strategies and holiday eating strategies .  Linda Ware has agreed to follow-up with our clinic in 2 weeks. She was informed of the importance of frequent follow-up visits to maximize her success with intensive lifestyle modifications for her multiple health conditions.   Objective:   Blood pressure 104/66, pulse (!) 53, temperature 98.1 F (  36.7 C), height 5\' 4"  (1.626 m), weight 240 lb (108.9 kg), SpO2 98 %. Body mass index is 41.2 kg/m.  General: Cooperative, alert, well developed, in no acute distress. HEENT: Conjunctivae and lids unremarkable. Cardiovascular: Regular rhythm.  Lungs: Normal work of breathing. Neurologic: No focal deficits.   Lab Results  Component Value Date   CREATININE 0.88 02/21/2020   BUN 10 02/21/2020   NA 139 02/21/2020   K 4.6  02/21/2020   CL 100 02/21/2020   CO2 23 02/21/2020   Lab Results  Component Value Date   ALT 25 02/21/2020   AST 21 02/21/2020   ALKPHOS 74 02/21/2020   BILITOT 0.5 02/21/2020   Lab Results  Component Value Date   HGBA1C 5.7 (H) 02/21/2020   Lab Results  Component Value Date   INSULIN 12.1 02/21/2020   Lab Results  Component Value Date   TSH 0.557 02/21/2020   Lab Results  Component Value Date   CHOL 172 02/21/2020   HDL 62 02/21/2020   LDLCALC 84 02/21/2020   TRIG 149 02/21/2020   CHOLHDL 2.8 02/21/2020   Lab Results  Component Value Date   WBC 10.3 02/21/2020   HGB 12.1 02/21/2020   HCT 39.1 02/21/2020   MCV 80 02/21/2020   PLT 315 02/21/2020   Attestation Statements:   Reviewed by clinician on day of visit: allergies, medications, problem list, medical history, surgical history, family history, social history, and previous encounter notes.  I, Water quality scientist, CMA, am acting as Location manager for [employer], DO.  I have reviewed the above documentation for accuracy and completeness, and I agree with the above. Marjory Sneddon, D.O.  The [place] was signed into law in 2016 which includes the topic of electronic health records.  This provides immediate access to information in MyChart.  This includes consultation notes, operative notes, office notes, lab results and pathology reports.  If you have any questions about what you read please let us know at your next visit so we can discuss your concerns and take corrective action if need be.  We are right here with you.

## 2020-04-10 ENCOUNTER — Ambulatory Visit (INDEPENDENT_AMBULATORY_CARE_PROVIDER_SITE_OTHER): Payer: 59 | Admitting: Family Medicine

## 2020-04-10 ENCOUNTER — Other Ambulatory Visit: Payer: Self-pay

## 2020-04-10 ENCOUNTER — Encounter (INDEPENDENT_AMBULATORY_CARE_PROVIDER_SITE_OTHER): Payer: Self-pay | Admitting: Family Medicine

## 2020-04-10 VITALS — BP 104/69 | HR 62 | Temp 97.9°F | Ht 64.0 in | Wt 237.0 lb

## 2020-04-10 DIAGNOSIS — Z6841 Body Mass Index (BMI) 40.0 and over, adult: Secondary | ICD-10-CM | POA: Diagnosis not present

## 2020-04-10 DIAGNOSIS — K648 Other hemorrhoids: Secondary | ICD-10-CM | POA: Diagnosis not present

## 2020-04-10 DIAGNOSIS — Z9189 Other specified personal risk factors, not elsewhere classified: Secondary | ICD-10-CM

## 2020-04-10 DIAGNOSIS — E559 Vitamin D deficiency, unspecified: Secondary | ICD-10-CM

## 2020-04-10 MED ORDER — VITAMIN D (ERGOCALCIFEROL) 1.25 MG (50000 UNIT) PO CAPS: 50000.0000 [IU] | ORAL_CAPSULE | ORAL | 0 refills | Status: DC

## 2020-04-10 NOTE — Progress Notes (Addendum)
Chief Complaint:   OBESITY Linda Ware is here to discuss her progress with her obesity treatment plan along with follow-up of her obesity related diagnoses. Dalonda is on the Category 2 Plan and states she is following her eating plan approximately 95% of the time. Caitlain states she is exercising 0 minutes 0 times per week.  Today's visit was #: 4 Starting weight: 249 lbs Starting date: 02/21/2020 Today's weight: 237 lbs Today's date: 04/19/2020 Total lbs lost to date: 12 lbs Total lbs lost since last in-office visit: 3 lbs Total weight loss percentage to date: -4.82%  Interim History: Airi reports that she ate a lot of protein over Thanksgiving and did PC/Burns. She had the flu and a Crohn's flare and did not eat for 3 days. She is requesting some snack choices like chips and popcorn.  Plan: Recommend air-popped popcorn with zero calories topping or near zero calorie and [employer] chips for snacks.  Assessment/Plan:   1. Other hemorrhoids Acute flair of condition. Worsening.   Swayzie is using lidocaine and proctofoam as needed. She has an upcoming appt with general surgery. She reports that her stools are soft and due to Crohn's disease she has loose stools.  However, she is not using the proctofoam regularly and has c/o  Discomfort today.   Plan: Continue lidocaine and proctofoam, alternating use. I explained the purpose of the use of the two medicines for her condition. Advised: No straining and keep stools loose.   2. Vitamin D deficiency Mellanie's Vitamin D level was 37.5 on 02/21/2020. She is currently taking prescription vitamin D 50,000 IU each week. She denies nausea, vomiting or muscle weakness.  Plan: Continue current treatment plan. Refill Vit D for 1 month, as per below.  Refill- Vitamin D, Ergocalciferol, (DRISDOL) 1.25 MG (50000 UNIT) CAPS capsule; Take 1 capsule (50,000 Units total) by mouth every 7 (seven) days.  Dispense: 4 capsule; Refill: 0  3. At risk for complication  associated with hypotension Pamalee was given approximately 15 minutes of education and counseling today regarding the condition of hypotension, the pathophysiology of the condition and concerns regarding prevention and treatment of this condition. We discussed risks of medications used to treat hypertension, which in the setting of weight loss, can inadvertently cause hypotension. Signs of hypotension such as feeling lightheaded or unsteady,esp when getting up first thing in the AM or off the toilet, were reviewed in detail and all questions were answered. The use of motivational interviewing was employed as a technique as well today to aid in the treatment of patient's multiple conditions. Pt understands importance of proper hydration and also of more prudent home BP monitoring while actively losing weight. She will call us, or their PCP, or other specialists who treat their BP with medications, with any questions or concerns that may develop.   4. Class 3 severe obesity with serious comorbidity and body mass index (BMI) of 40.0 to 44.9 in adult, unspecified obesity type (HCC) Jazelle is currently in the action stage of change. As such, her goal is to continue with weight loss efforts. She has agreed to the Category 2 Plan.   Exercise goals: All adults should avoid inactivity. Some physical activity is better than none, and adults who participate in any amount of physical activity gain some health benefits.  Behavioral modification strategies: meal planning and cooking strategies, better snacking choices and planning for success.  Lyric has agreed to follow-up with our clinic in 2 weeks. She was informed of the  importance of frequent follow-up visits to maximize her success with intensive lifestyle modifications for her multiple health conditions.   Objective:   Blood pressure 104/69, pulse 62, temperature 97.9 F (36.6 C), height 5\' 4"  (1.626 m), weight 237 lb (107.5 kg), SpO2 100 %. Body mass index  is 40.68 kg/m.  General: Cooperative, alert, well developed, in no acute distress. HEENT: Conjunctivae and lids unremarkable. Cardiovascular: Regular rhythm.  Lungs: Normal work of breathing. Neurologic: No focal deficits.   Lab Results  Component Value Date   CREATININE 0.88 02/21/2020   BUN 10 02/21/2020   NA 139 02/21/2020   K 4.6 02/21/2020   CL 100 02/21/2020   CO2 23 02/21/2020   Lab Results  Component Value Date   ALT 25 02/21/2020   AST 21 02/21/2020   ALKPHOS 74 02/21/2020   BILITOT 0.5 02/21/2020   Lab Results  Component Value Date   HGBA1C 5.7 (H) 02/21/2020   Lab Results  Component Value Date   INSULIN 12.1 02/21/2020   Lab Results  Component Value Date   TSH 0.557 02/21/2020   Lab Results  Component Value Date   CHOL 172 02/21/2020   HDL 62 02/21/2020   LDLCALC 84 02/21/2020   TRIG 149 02/21/2020   CHOLHDL 2.8 02/21/2020   Lab Results  Component Value Date   WBC 10.3 02/21/2020   HGB 12.1 02/21/2020   HCT 39.1 02/21/2020   MCV 80 02/21/2020   PLT 315 02/21/2020    Attestation Statements:   Reviewed by clinician on day of visit: allergies, medications, problem list, medical history, surgical history, family history, social history, and previous encounter notes.  Coral Ceo, am acting as Location manager for [employer], DO.  I have reviewed the above documentation for accuracy and completeness, and I agree with the above. -  *Marjory Sneddon, D.O.  The [place] was signed into law in 2016 which includes the topic of electronic health records.  This provides immediate access to information in MyChart.  This includes consultation notes, operative notes, office notes, lab results and pathology reports.  If you have any questions about what you read please let us know at your next visit so we can discuss your concerns and take corrective action if need be.  We are right here with you.

## 2020-04-11 LAB — HEPATIC FUNCTION PANEL
ALT: 27 (ref 7–35)
AST: 19 (ref 13–35)
Alkaline Phosphatase: 58 (ref 25–125)
Bilirubin, Total: 0.6

## 2020-04-11 LAB — BASIC METABOLIC PANEL
BUN: 11 (ref 4–21)
CO2: 24 — AB (ref 13–22)
Chloride: 103 (ref 99–108)
Creatinine: 0.9 (ref 0.5–1.1)
Glucose: 74
Potassium: 4.2 (ref 3.4–5.3)
Sodium: 138 (ref 137–147)

## 2020-04-11 LAB — LIPID PANEL
Cholesterol: 151 (ref 0–200)
HDL: 49 (ref 35–70)
LDL Cholesterol: 69
LDl/HDL Ratio: 1.4
Triglycerides: 164 — AB (ref 40–160)

## 2020-04-11 LAB — CBC AND DIFFERENTIAL
HCT: 39 (ref 36–46)
Hemoglobin: 11.4 — AB (ref 12.0–16.0)
Platelets: 403 — AB (ref 150–399)
WBC: 11.2

## 2020-04-11 LAB — VITAMIN D 25 HYDROXY (VIT D DEFICIENCY, FRACTURES): Vit D, 25-Hydroxy: 57.9

## 2020-04-11 LAB — COMPREHENSIVE METABOLIC PANEL
Albumin: 3.7 (ref 3.5–5.0)
Calcium: 8.8 (ref 8.7–10.7)
GFR calc Af Amer: >60
GFR calc non Af Amer: 71
Globulin: 2.8

## 2020-04-11 LAB — HEMOGLOBIN A1C: Hemoglobin A1C: 6

## 2020-04-11 LAB — TSH: TSH: 1.34 (ref 0.41–5.90)

## 2020-04-11 LAB — CBC: RBC: 4.7 (ref 3.87–5.11)

## 2020-04-19 NOTE — Addendum Note (Signed)
Addended by: Marjory Sneddon on: 04/19/2020 03:15 PM   Modules accepted: Level of Service

## 2020-04-29 ENCOUNTER — Ambulatory Visit (INDEPENDENT_AMBULATORY_CARE_PROVIDER_SITE_OTHER): Payer: 59 | Admitting: Family Medicine

## 2020-04-29 ENCOUNTER — Other Ambulatory Visit: Payer: Self-pay

## 2020-04-29 ENCOUNTER — Encounter (INDEPENDENT_AMBULATORY_CARE_PROVIDER_SITE_OTHER): Payer: Self-pay | Admitting: Family Medicine

## 2020-04-29 VITALS — BP 118/66 | HR 72 | Temp 98.3°F | Ht 64.0 in | Wt 236.0 lb

## 2020-04-29 DIAGNOSIS — Z6841 Body Mass Index (BMI) 40.0 and over, adult: Secondary | ICD-10-CM

## 2020-04-29 DIAGNOSIS — K50919 Crohn's disease, unspecified, with unspecified complications: Secondary | ICD-10-CM | POA: Insufficient documentation

## 2020-04-29 DIAGNOSIS — R7303 Prediabetes: Secondary | ICD-10-CM | POA: Diagnosis not present

## 2020-04-29 DIAGNOSIS — K648 Other hemorrhoids: Secondary | ICD-10-CM

## 2020-04-29 DIAGNOSIS — Z9189 Other specified personal risk factors, not elsewhere classified: Secondary | ICD-10-CM | POA: Diagnosis not present

## 2020-04-29 DIAGNOSIS — E559 Vitamin D deficiency, unspecified: Secondary | ICD-10-CM

## 2020-04-29 MED ORDER — VITAMIN D (ERGOCALCIFEROL) 1.25 MG (50000 UNIT) PO CAPS: 50000.0000 [IU] | ORAL_CAPSULE | ORAL | 0 refills | Status: DC

## 2020-04-29 NOTE — Progress Notes (Signed)
Chief Complaint:   OBESITY Linda Ware is here to discuss her progress with her obesity treatment plan along with follow-up of her obesity related diagnoses. Linda Ware is on the Category 2 Plan and states she is following her eating plan approximately 50% of the time. Linda Ware states she is exercising 0 minutes 0 times per week.  Today's visit was #: 5 Starting weight: 249 lbs Starting date: 02/21/2020 Today's weight: 236 lbs Today's date: 04/30/2020 Total lbs lost to date: 13 lbs Total lbs lost since last in-office visit: 1 lb Total weight loss percentage to date: -5.22%  Interim History: Linda Ware is surprised she lost weight. She states she has been craving sweets like crazy and eating holiday cookies, candies, and treats.  Assessment/Plan:   1. Pre-diabetes Linda Ware is not prescribed medication to aid in disease progression. She brought in labs that were done at her PCP office on 04/11/2020. Her A1c on 02/21/2020 was 5.7 and on 04/11/2020 A1c increased to 6.0.   Plan:  - I reiterated and again counseled patient on pathophysiology of the disease process of I.R. and/or Pre-DM.    - Stressed importance of dietary and lifestyle modifications resulting in weight loss as first line txmnt - in addition we discussed the risks and benefits of metformin and various other medication options which can help [place] in the management of this disease process. Will consider them again in the future since pt wishes to not start medications at this time - continue to decrease simple carbs; increase fiber and proteins -> follow meal plan  - handouts provided at pt's request after education provided.  All concerns/questions addressed.   - anticipatory guidance given.   - Recheck A1c and fasting insulin level in approximately 3 months from last check or as deemed fit.    2. Crohn's disease with complication, unspecified gastrointestinal tract location [facility]) Linda Ware's gastroenterologist started her on [place] recently. It  is making her bowel movements more regular, and she's having less symptoms of GI distress.  Plan: Continue current treatment plan, per GI.  3. Vitamin D deficiency Linda Ware's Vitamin D level was 57.9 on 04/11/2020, per labs from PCP office. She is currently taking prescription vitamin D 50,000 IU each week. She denies nausea, vomiting or muscle weakness.   Ref. Range 02/21/2020 10:05  Vitamin D, 25-Hydroxy Latest Ref Range: 30.0 - 100.0 ng/mL 37.5   Plan: Refill Vit D for 1 month, as per below. Low Vitamin D level contributes to fatigue and are associated with obesity, breast, and colon cancer. She agrees to continue to take prescription Vitamin D @50 ,000 IU every week and will follow-up for routine testing of Vitamin D, at least 2-3 times per year to avoid over-replacement.  Refill- Vitamin D, Ergocalciferol, (DRISDOL) 1.25 MG (50000 UNIT) CAPS capsule; Take 1 capsule (50,000 Units total) by mouth every 7 (seven) days.  Dispense: 4 capsule; Refill: 0  4. Other hemorrhoids Linda Ware's symptoms are now stable. She used topical steroids with good results.  Denies pain currently  Plan: Linda Ware will be consulting with a general surgeon in the near future. Her symptoms are currently controlled and not acutely flared.  5. At risk for diabetes mellitus - Linda Ware was given extensive diabetes prevention education and counseling today of more than 15 minutes.  - Counseled patient on pathophysiology of disease and discussed various treatment options which always includes dietary and lifestyle modification as first line.   - Importance of healthy diet with very limited amounts of simple carbohydrates discussed with  patient in addition to regular aerobic exercise to an eventual goal of 1min 5d/week or more.  - Handouts provided at patient's desire and or told to go online at the American Diabetes Association website for further information  6. Class 3 severe obesity with serious comorbidity and body mass index  (BMI) of 40.0 to 44.9 in adult, unspecified obesity type (HCC) Linda Ware is currently in the action stage of change. As such, her goal is to continue with weight loss efforts. She has agreed to the Category 2 Plan.   Exercise goals: All adults should avoid inactivity. Some physical activity is better than none, and adults who participate in any amount of physical activity gain some health benefits.  Behavioral modification strategies: increasing lean protein intake, decreasing simple carbohydrates, increasing water intake, holiday eating strategies , celebration eating strategies and avoiding temptations.  Linda Ware has agreed to follow-up with our clinic in 2 weeks. She was informed of the importance of frequent follow-up visits to maximize her success with intensive lifestyle modifications for her multiple health conditions.   Objective:   Blood pressure 118/66, pulse 72, temperature 98.3 F (36.8 C), height 5\' 4"  (1.626 m), weight 236 lb (107 kg), SpO2 98 %. Body mass index is 40.51 kg/m.  General: Cooperative, alert, well developed, in no acute distress. HEENT: Conjunctivae and lids unremarkable. Cardiovascular: Regular rhythm.  Lungs: Normal work of breathing. Neurologic: No focal deficits.   Lab Results  Component Value Date   CREATININE 0.88 02/21/2020   BUN 10 02/21/2020   NA 139 02/21/2020   K 4.6 02/21/2020   CL 100 02/21/2020   CO2 23 02/21/2020   Lab Results  Component Value Date   ALT 25 02/21/2020   AST 21 02/21/2020   ALKPHOS 74 02/21/2020   BILITOT 0.5 02/21/2020   Lab Results  Component Value Date   HGBA1C 5.7 (H) 02/21/2020   Lab Results  Component Value Date   INSULIN 12.1 02/21/2020   Lab Results  Component Value Date   TSH 0.557 02/21/2020   Lab Results  Component Value Date   CHOL 172 02/21/2020   HDL 62 02/21/2020   LDLCALC 84 02/21/2020   TRIG 149 02/21/2020   CHOLHDL 2.8 02/21/2020   Lab Results  Component Value Date   WBC 10.3 02/21/2020    HGB 12.1 02/21/2020   HCT 39.1 02/21/2020   MCV 80 02/21/2020   PLT 315 02/21/2020   Attestation Statements:   Reviewed by clinician on day of visit: allergies, medications, problem list, medical history, surgical history, family history, social history, and previous encounter notes.  Coral Ceo, am acting as Location manager for [employer], DO.  I have reviewed the above documentation for accuracy and completeness, and I agree with the above. Marjory Sneddon, D.O.  The [place] was signed into law in 2016 which includes the topic of electronic health records.  This provides immediate access to information in MyChart.  This includes consultation notes, operative notes, office notes, lab results and pathology reports.  If you have any questions about what you read please let us know at your next visit so we can discuss your concerns and take corrective action if need be.  We are right here with you.

## 2020-05-08 ENCOUNTER — Other Ambulatory Visit (INDEPENDENT_AMBULATORY_CARE_PROVIDER_SITE_OTHER): Payer: Self-pay

## 2020-05-13 ENCOUNTER — Ambulatory Visit (INDEPENDENT_AMBULATORY_CARE_PROVIDER_SITE_OTHER): Payer: 59 | Admitting: Family Medicine

## 2020-05-14 ENCOUNTER — Institutional Professional Consult (permissible substitution): Payer: 59 | Admitting: Pulmonary Disease

## 2020-05-20 ENCOUNTER — Encounter (INDEPENDENT_AMBULATORY_CARE_PROVIDER_SITE_OTHER): Payer: Self-pay | Admitting: Family Medicine

## 2020-05-20 ENCOUNTER — Ambulatory Visit (INDEPENDENT_AMBULATORY_CARE_PROVIDER_SITE_OTHER): Payer: 59 | Admitting: Family Medicine

## 2020-05-20 ENCOUNTER — Other Ambulatory Visit: Payer: Self-pay

## 2020-05-20 VITALS — BP 125/76 | HR 62 | Temp 98.0°F | Ht 64.0 in | Wt 236.0 lb

## 2020-05-20 DIAGNOSIS — Z6841 Body Mass Index (BMI) 40.0 and over, adult: Secondary | ICD-10-CM

## 2020-05-20 DIAGNOSIS — Z9189 Other specified personal risk factors, not elsewhere classified: Secondary | ICD-10-CM | POA: Diagnosis not present

## 2020-05-20 DIAGNOSIS — R7303 Prediabetes: Secondary | ICD-10-CM

## 2020-05-20 DIAGNOSIS — E559 Vitamin D deficiency, unspecified: Secondary | ICD-10-CM | POA: Diagnosis not present

## 2020-05-20 DIAGNOSIS — F39 Unspecified mood [affective] disorder: Secondary | ICD-10-CM

## 2020-05-20 MED ORDER — VITAMIN D (ERGOCALCIFEROL) 1.25 MG (50000 UNIT) PO CAPS
50000.0000 [IU] | ORAL_CAPSULE | ORAL | 0 refills | Status: DC
Start: 2020-05-20 — End: 2023-01-03

## 2020-05-22 NOTE — Progress Notes (Signed)
Chief Complaint:   OBESITY Linda Ware is here to discuss her progress with her obesity treatment plan along with follow-up of her obesity related diagnoses. Linda Ware is on the Category 2 Plan and states she is following her eating plan approximately 25% of the time. Linda Ware states she is using the rebounder 5-10 minutes 3 times per week.  Today's visit was #: 6 Starting weight: 249 lbs Starting date: 02/21/2020 Today's weight: 236 lbs Today's date: 05/20/2020 Total lbs lost to date: 13 lbs Total lbs lost since last in-office visit: 0 Total weight loss percentage to date: -5.22%  Interim History: Linda Ware had COVID from December 28th through January 2nd. She is not vaccinated and this is her 2nd time having COVID. The first time her symptoms were mild as well. Of the 75% of the time she is not on plan, over 1/2 the time she tries to intake [place]. She started bouncing on trampoline 5-10 minutes 3 days a week. Linda Ware has been struggling with motivation to make healthier choices lately and has stopped seeing her counselor so frequently over the last couple of months.  Assessment/Plan:   1. Pre-diabetes Linda Ware's A1c at her PCP's office was 6.0 on 04/11/2020. She is not taking any medication. Linda Ware has a diagnosis of prediabetes based on her elevated HgA1c and was informed this puts her at greater risk of developing diabetes. She continues to work on diet and exercise to decrease her risk of diabetes. She denies nausea or hypoglycemia.  Lab Results  Component Value Date   HGBA1C 6.0 04/11/2020   Lab Results  Component Value Date   INSULIN 12.1 02/21/2020   Plan: I gave Linda Ware handout on Metformin and discussed possibility of addition of medication to help with weight loss. Linda Ware declines and wants to read about them first. Linda Ware will continue to work on weight loss, exercise, and decreasing simple carbohydrates to help decrease the risk of diabetes.   2. Vitamin D deficiency Linda Ware's Vitamin D level  was 57.9 on 04/11/2020. She is currently taking prescription vitamin D 50,000 IU each week. She denies nausea, vomiting or muscle weakness.  Ref. Range 04/11/2020 00:00  Vitamin D, 25-Hydroxy Unknown 57.9   Plan: Refill Vit D for 1 month, as per below. Low Vitamin D level contributes to fatigue and are associated with obesity, breast, and colon cancer. She agrees to continue to take prescription Vitamin D @50 ,000 IU every week and will follow-up for routine testing of Vitamin D, at least 2-3 times per year to avoid over-replacement.  Refill- Vitamin D, Ergocalciferol, (DRISDOL) 1.25 MG (50000 UNIT) CAPS capsule; Take 1 capsule (50,000 Units total) by mouth every 7 (seven) days.  Dispense: 4 capsule; Refill: 0  3. Mood disorder ([place]), with emotional eating Linda Ware sees a Network engineer. She was seeing her every week in the fall but has been about once a month since December. Linda Ware is struggling emotionally with motivation to change.  Plan: Emotional eating strategies; journaling; going to see counselor at least every 2 weeks, is what I recommend patient does to help with intrinsic motivation. I also recommend a self help workbook to engage in focusing on her "why" and not all the barriers to change.    4. At risk for activity intolerance Linda Ware was given approximately 9 minutes of exercise intolerance counseling today. She is 50 y.o. female and has risk factors exercise intolerance including obesity. We discussed intensive lifestyle modifications today with an emphasis on specific weight loss instructions and strategies. Linda Ware  will slowly increase activity as tolerated.  5. Class 3 severe obesity with serious comorbidity and body mass index (BMI) of 40.0 to 44.9 in adult, unspecified obesity type (HCC) Linda Ware is currently in the action stage of change. As such, her goal is to continue with weight loss efforts. She has agreed to the Category 2 Plan.   Exercise goals: For substantial health benefits,  adults should do at least 150 minutes (2 hours and 30 minutes) a week of moderate-intensity, or 75 minutes (1 hour and 15 minutes) a week of vigorous-intensity aerobic physical activity, or an equivalent combination of moderate- and vigorous-intensity aerobic activity. Aerobic activity should be performed in episodes of at least 10 minutes, and preferably, it should be spread throughout the week. Encouraged to increase to goal  Behavioral modification strategies: increasing lean protein intake, decreasing simple carbohydrates, keeping healthy foods in the home, emotional eating strategies and planning for success.  Linda Ware has agreed to follow-up with our clinic in 2 weeks. She was informed of the importance of frequent follow-up visits to maximize her success with intensive lifestyle modifications for her multiple health conditions.   Objective:   Blood pressure 125/76, pulse 62, temperature 98 F (36.7 C), height 5\' 4"  (1.626 m), weight 236 lb (107 kg), SpO2 100 %. Body mass index is 40.51 kg/m.  General: Cooperative, alert, well developed, in no acute distress. HEENT: Conjunctivae and lids unremarkable. Cardiovascular: Regular rhythm.  Lungs: Normal work of breathing. Neurologic: No focal deficits.   Lab Results  Component Value Date   CREATININE 0.9 04/11/2020   BUN 11 04/11/2020   NA 138 04/11/2020   K 4.2 04/11/2020   CL 103 04/11/2020   CO2 24 (A) 04/11/2020   Lab Results  Component Value Date   ALT 27 04/11/2020   AST 19 04/11/2020   ALKPHOS 58 04/11/2020   BILITOT 0.5 02/21/2020   Lab Results  Component Value Date   HGBA1C 6.0 04/11/2020   HGBA1C 5.7 (H) 02/21/2020   Lab Results  Component Value Date   INSULIN 12.1 02/21/2020   Lab Results  Component Value Date   TSH 1.34 04/11/2020   Lab Results  Component Value Date   CHOL 151 04/11/2020   HDL 49 04/11/2020   LDLCALC 69 04/11/2020   TRIG 164 (A) 04/11/2020   CHOLHDL 2.8 02/21/2020   Lab Results   Component Value Date   WBC 11.2 04/11/2020   HGB 11.4 (A) 04/11/2020   HCT 39 04/11/2020   MCV 80 02/21/2020   PLT 403 (A) 04/11/2020    Attestation Statements:   Reviewed by clinician on day of visit: allergies, medications, problem list, medical history, surgical history, family history, social history, and previous encounter notes.   Coral Ceo, am acting as Location manager for [employer], DO.  I have reviewed the above documentation for accuracy and completeness, and I agree with the above. Marjory Sneddon, D.O.  The [place] was signed into law in 2016 which includes the topic of electronic health records.  This provides immediate access to information in MyChart.  This includes consultation notes, operative notes, office notes, lab results and pathology reports.  If you have any questions about what you read please let us know at your next visit so we can discuss your concerns and take corrective action if need be.  We are right here with you.

## 2020-06-02 ENCOUNTER — Ambulatory Visit (INDEPENDENT_AMBULATORY_CARE_PROVIDER_SITE_OTHER): Payer: 59 | Admitting: Family Medicine

## 2020-06-10 ENCOUNTER — Other Ambulatory Visit (INDEPENDENT_AMBULATORY_CARE_PROVIDER_SITE_OTHER): Payer: Self-pay | Admitting: Family Medicine

## 2020-06-10 DIAGNOSIS — E559 Vitamin D deficiency, unspecified: Secondary | ICD-10-CM

## 2020-06-12 IMAGING — US US EXTREM LOW VENOUS
1 series · 13 of 24 positions shown · non-contrast
Comparison: None.

CLINICAL DATA: Initial evaluation for acute bilateral leg swelling
for 2 days.



[Series 1: us extrem low venous · 13 of 67 slices shown]
[im 1/67]
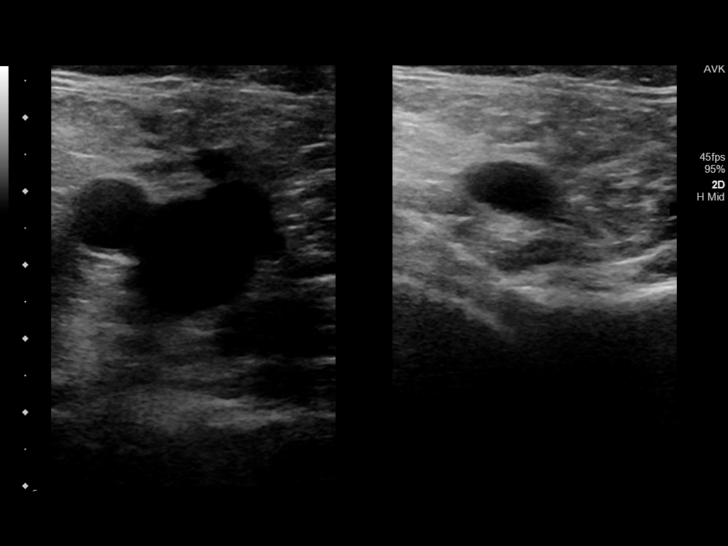
[im 6/67]
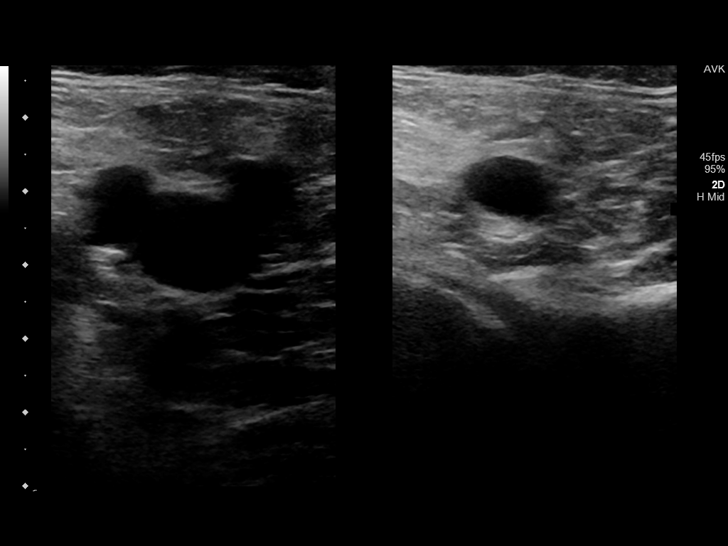
[im 12/67]
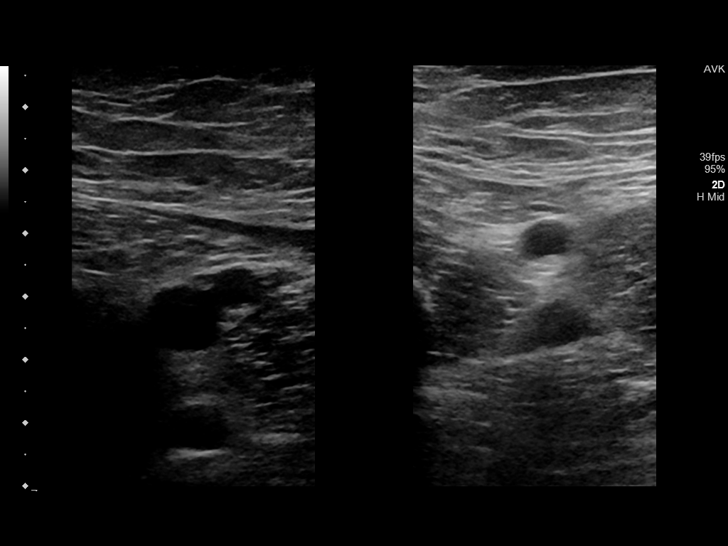
[im 18/67]
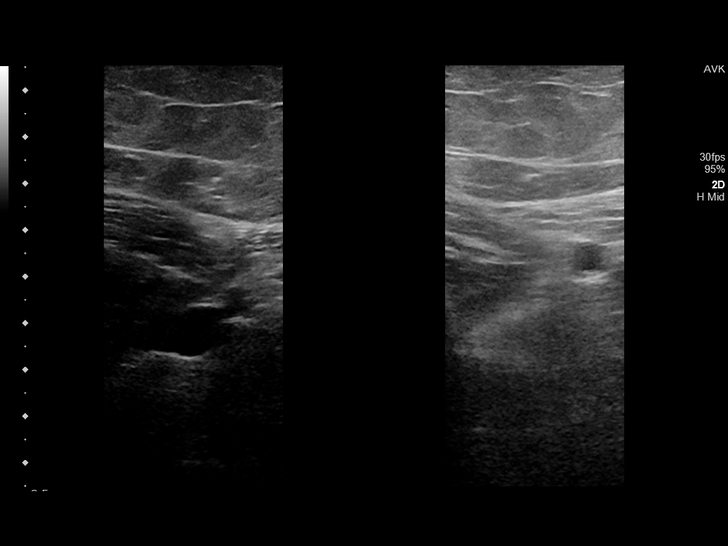
[im 23/67]
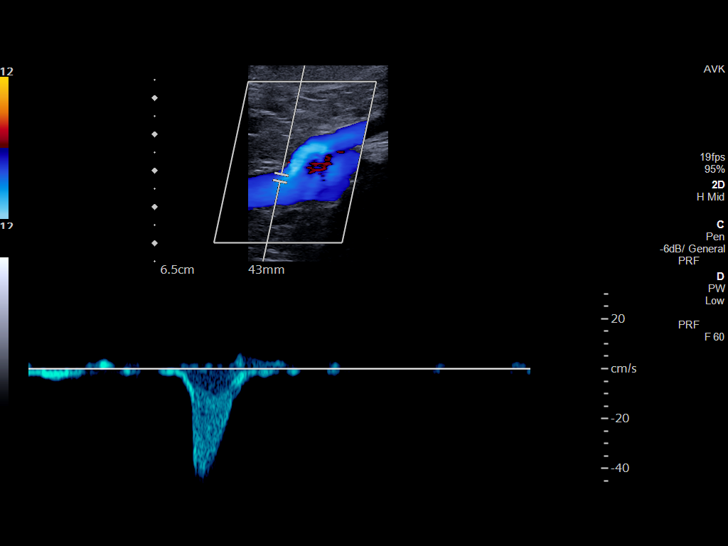
[im 29/67]
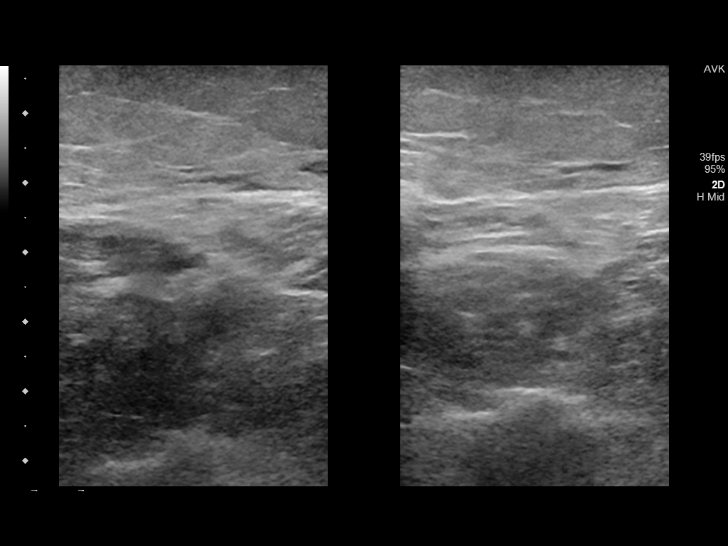
[im 35/67]
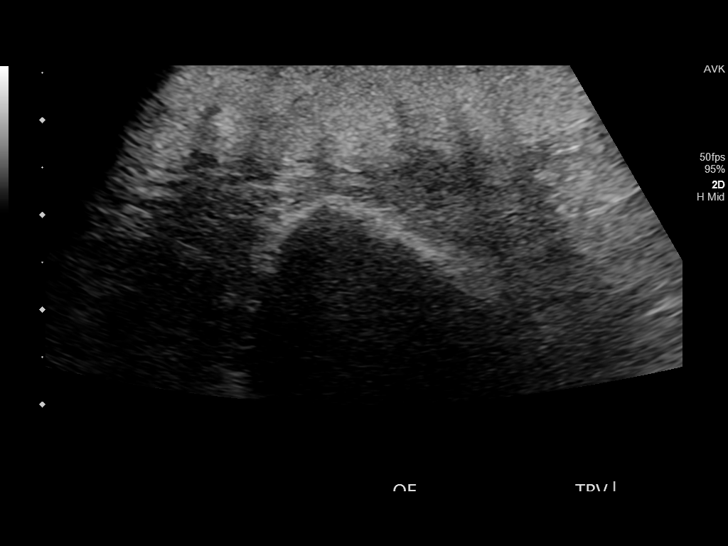
[im 38/67]
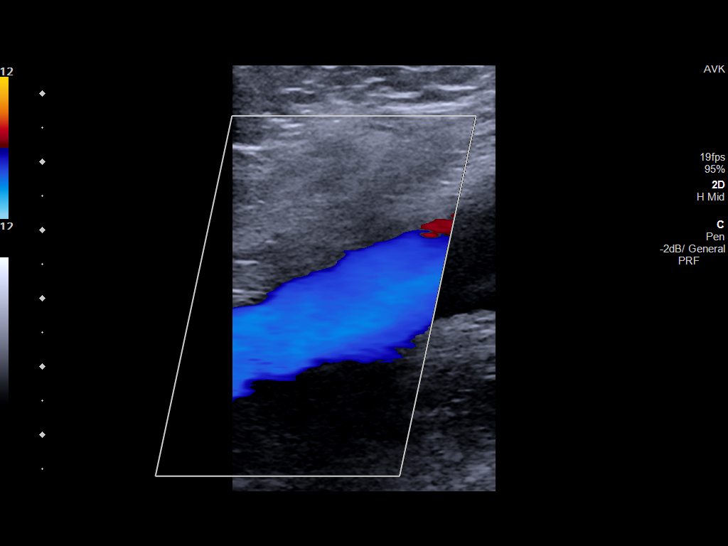
[im 44/67]
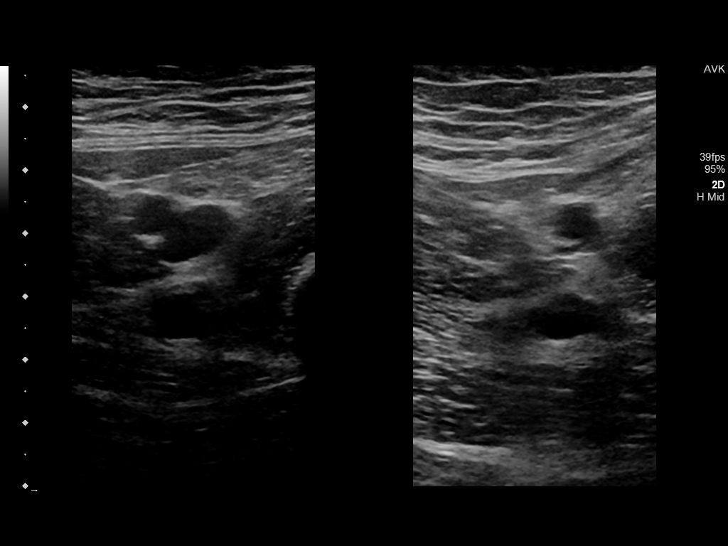
[im 49/67]
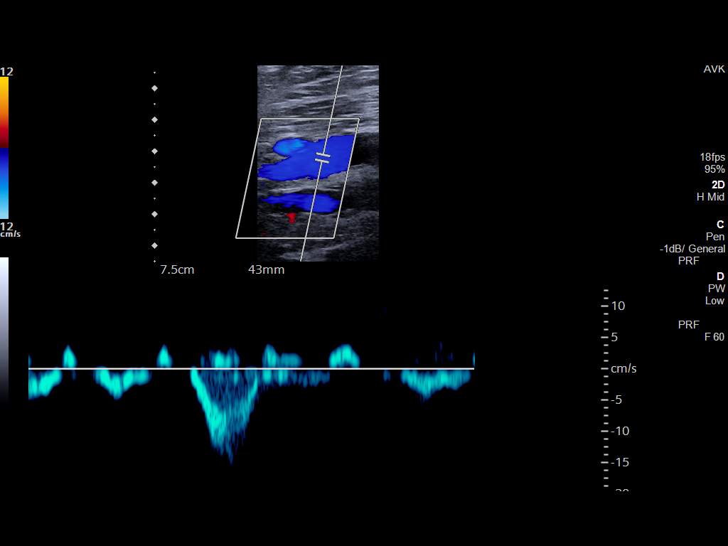
[im 55/67]
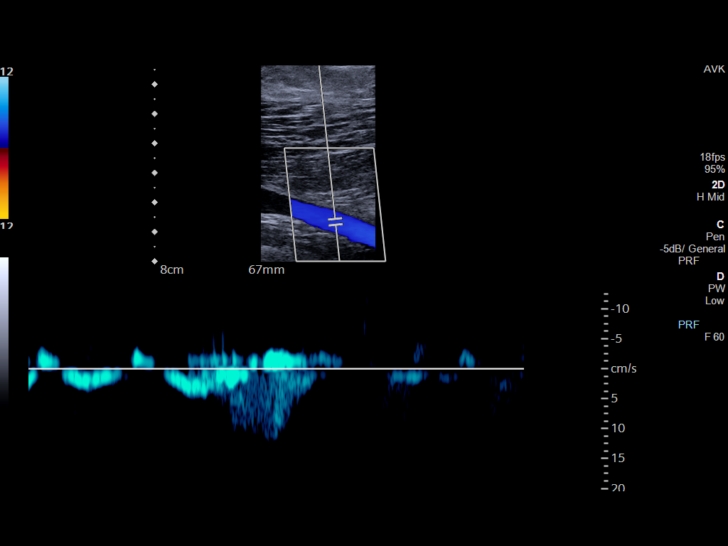
[im 61/67]
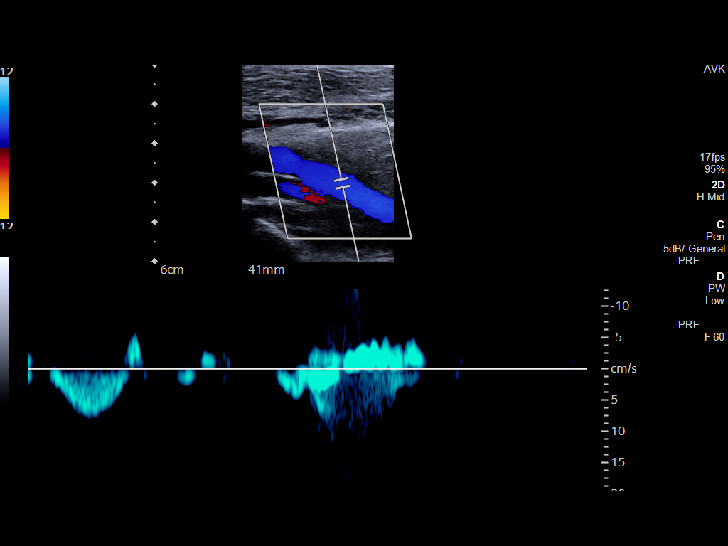
[im 67/67]
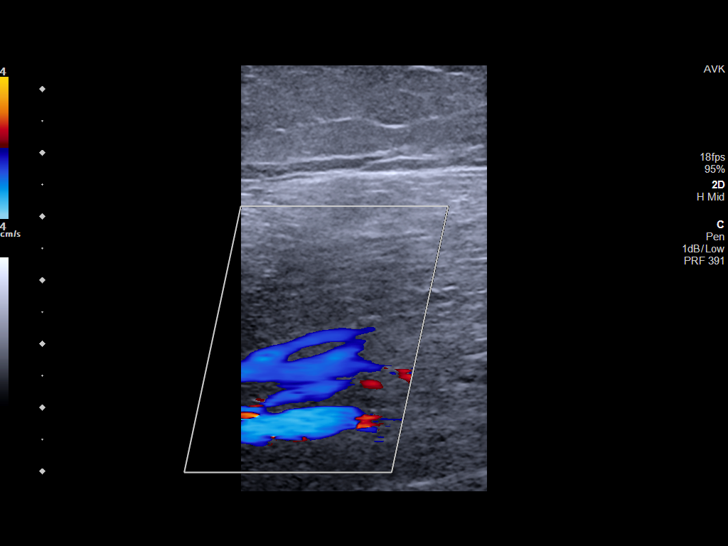

[13 of 24 positions shown; findings below may reference images not displayed]

FINDINGS: RIGHT LOWER EXTREMITY

Common Femoral Vein: No evidence of thrombus. Normal
compressibility, respiratory phasicity and response to augmentation.

Saphenofemoral Junction: No evidence of thrombus. Normal
compressibility and flow on color Doppler imaging.

Profunda Femoral Vein: No evidence of thrombus. Normal
compressibility and flow on color Doppler imaging.

Femoral Vein: No evidence of thrombus. Normal compressibility,
respiratory phasicity and response to augmentation.

Popliteal Vein: No evidence of thrombus. Normal compressibility,
respiratory phasicity and response to augmentation.

Calf Veins: No evidence of thrombus. Normal compressibility and flow
on color Doppler imaging.

Superficial Great Saphenous Vein: No evidence of thrombus. Normal
compressibility.

Venous Reflux:  None.

Other Findings:  None.

LEFT LOWER EXTREMITY

Common Femoral Vein: No evidence of thrombus. Normal
compressibility, respiratory phasicity and response to augmentation.

Saphenofemoral Junction: No evidence of thrombus. Normal
compressibility and flow on color Doppler imaging.

Profunda Femoral Vein: No evidence of thrombus. Normal
compressibility and flow on color Doppler imaging.

Femoral Vein: No evidence of thrombus. Normal compressibility,
respiratory phasicity and response to augmentation.

Popliteal Vein: No evidence of thrombus. Normal compressibility,
respiratory phasicity and response to augmentation.

Calf Veins: No evidence of thrombus. Normal compressibility and flow
on color Doppler imaging.

Superficial Great Saphenous Vein: No evidence of thrombus. Normal
compressibility.

Venous Reflux:  None.

Other Findings:  None.
IMPRESSION: No evidence of deep venous thrombosis in either lower extremity.

## 2020-06-16 NOTE — Telephone Encounter (Signed)
Linda Ware, had reached out to me right before her last day in December to make me aware that she spoke multiple times with patient requesting to get her records.  She needed continued care and provider was concerned with reported abnormal pap and crohns disease issues.  Patient had not been compliant with getting [place] records.   I can see below that patient was contacted by our office to set up continued care appt with one of our other providers at the time of [name] leaving.  Patient refused stating she would find appointment with prior primary care office.

## 2020-09-30 ENCOUNTER — Encounter (INDEPENDENT_AMBULATORY_CARE_PROVIDER_SITE_OTHER): Payer: Self-pay | Admitting: Family Medicine

## 2020-11-11 ENCOUNTER — Telehealth: Payer: Self-pay

## 2020-11-11 NOTE — Telephone Encounter (Signed)
Contacted pt because she has [facility] and Dr. Raliegh Scarlet since she called this clinic and talked to the after hours nurse line. She reported she contacted this number to see if the clinic had anyone on-call because she was in so much pain. She has now been to an UC this morning and found that she has two rib fractures. She is now established with Dr Raliegh Scarlet. Advised this note would be forwarded to her and if anything further is needed to contact the office. Pt verbalized understanding and was very appreciative of the call.

## 2020-11-11 NOTE — Telephone Encounter (Signed)
[place] Night - Client TELEPHONE ADVICE RECORD [facility]urse Patient Name: Linda Ware [facility] Gender: Female DOB: Aug 30, 1970 Age: 50 Y 93 M 25 D Return Phone Number: [phone] (Primary) Address: City/ State/ Zip: [place] [address] [address] Client [facility] Night - Client Client Site [place] Physician [facility] - PHYSICIAN, Verita Schneiders- MD Contact Type Call Who Is Calling Patient / Member / Family / Caregiver Call Type Triage / Clinical Relationship To Patient Self Return Phone Number [phone] (Primary) Chief Complaint Chest Injury Reason for Call Symptomatic / Request for [place] states she thinks she has a fractured rib. She wants a RX for pain management. Translation No Nurse Assessment Nurse: Eugenio Hoes, RN, Jenny Reichmann Date/Time (Eastern Time): 11/10/2020 4:35:17 PM Confirm and document reason for call. If symptomatic, describe symptoms. ---Caller states that she got a hug from her husband and she heard a crack and took her breath away. She has been in a lot of pain. Pain is under her right breast. 7/10. Pain is pretty constant. Does the patient have any new or worsening symptoms? ---Yes Will a triage be completed? ---Yes Related visit to physician within the last 2 weeks? ---No Does the PT have any chronic conditions? (i.e. diabetes, asthma, this includes High risk factors for pregnancy, etc.) ---Yes List chronic conditions. ---Asthma, Hypothyroid, Chron's Is the patient pregnant or possibly pregnant? (Ask all females between the ages of 34-55) ---No Is this a behavioral health or substance abuse call? ---No Guidelines Guideline Title Affirmed Question Affirmed Notes Nurse Date/Time Eilene Ghazi Time) Chest Injury SEVERE chest pain Lester Quail, Jenny Reichmann 11/10/2020 4:37:04 PM Disp. Time Eilene Ghazi Time) Disposition Final User 11/10/2020 4:41:05 PM Go to ED Now Yes Eugenio Hoes, RN,  Jenny Reichmann PLEASE NOTE: All timestamps contained within this report are represented as [place] Standard Time. CONFIDENTIALTY NOTICE: This fax transmission is intended only for the addressee. It contains information that is legally privileged, confidential or otherwise protected from use or disclosure. If you are not the intended recipient, you are strictly prohibited from reviewing, disclosing, copying using or disseminating any of this information or taking any action in reliance on or regarding this information. If you have received this fax in error, please notify us immediately by telephone so that we can arrange for its return to [place]. Phone: [phone], Toll-Free: [phone], Fax: [phone] Page: 2 of 2 Call Id: [id number] Caller Disagree/Comply Disagree Caller Understands Yes PreDisposition Did not know what to do Care Advice Given Per Guideline GO TO ED NOW: * You need to be seen in the Emergency Department. * Go to the ED at ___________ [place] now. Drive carefully. NOTE TO TRIAGER - DRIVING: * Another adult should drive. CARE ADVICE given per Chest Injury (Adult) guideline. Comments User: Baird Cancer, RN Date/Time Eilene Ghazi Time): 11/10/2020 4:42:05 PM caller does not want to go to ER now; she wants something called in for pain and will go to ER tomorrow; advised that our recommendation is to be seen at ER Referrals [place] REFUSED

## 2020-11-12 NOTE — Telephone Encounter (Signed)
Noted. Will forward to a provider in the office to review but pt said she is not establishing at this office.

## 2020-11-12 NOTE — Telephone Encounter (Signed)
Noted.  I'll defer.  Thanks.

## 2021-04-17 ENCOUNTER — Other Ambulatory Visit: Payer: Self-pay | Admitting: Family Medicine

## 2021-04-17 DIAGNOSIS — Z780 Asymptomatic menopausal state: Secondary | ICD-10-CM

## 2021-04-21 ENCOUNTER — Ambulatory Visit
Admission: RE | Admit: 2021-04-21 | Discharge: 2021-04-21 | Disposition: A | Discharge status: Home / Self Care | Payer: 59 | Source: Ambulatory Visit | Attending: Family Medicine | Admitting: Family Medicine

## 2021-04-21 ENCOUNTER — Other Ambulatory Visit: Payer: Self-pay

## 2021-04-21 DIAGNOSIS — Z780 Asymptomatic menopausal state: Secondary | ICD-10-CM

## 2021-06-03 DIAGNOSIS — K5010 Crohn's disease of large intestine without complications: Secondary | ICD-10-CM | POA: Insufficient documentation

## 2021-06-12 ENCOUNTER — Other Ambulatory Visit (HOSPITAL_COMMUNITY): Payer: Self-pay | Admitting: Surgery

## 2021-07-15 ENCOUNTER — Encounter: Payer: Self-pay | Admitting: Dermatology

## 2021-07-29 ENCOUNTER — Ambulatory Visit: Payer: 59 | Admitting: Dermatology

## 2021-10-08 ENCOUNTER — Ambulatory Visit: Payer: Commercial Managed Care - HMO | Admitting: Dermatology

## 2021-10-08 ENCOUNTER — Encounter: Payer: Self-pay | Admitting: Dermatology

## 2021-10-08 DIAGNOSIS — L814 Other melanin hyperpigmentation: Secondary | ICD-10-CM

## 2021-10-08 DIAGNOSIS — L719 Rosacea, unspecified: Secondary | ICD-10-CM | POA: Diagnosis not present

## 2021-10-08 DIAGNOSIS — L821 Other seborrheic keratosis: Secondary | ICD-10-CM

## 2021-10-08 DIAGNOSIS — L578 Other skin changes due to chronic exposure to nonionizing radiation: Secondary | ICD-10-CM

## 2021-10-08 DIAGNOSIS — Z86018 Personal history of other benign neoplasm: Secondary | ICD-10-CM | POA: Diagnosis not present

## 2021-10-08 DIAGNOSIS — Z1283 Encounter for screening for malignant neoplasm of skin: Secondary | ICD-10-CM

## 2021-10-08 DIAGNOSIS — D229 Melanocytic nevi, unspecified: Secondary | ICD-10-CM

## 2021-10-08 DIAGNOSIS — D1800 Hemangioma unspecified site: Secondary | ICD-10-CM

## 2021-10-08 NOTE — Patient Instructions (Signed)
Recommend daily broad spectrum sunscreen SPF 30+ to sun-exposed areas, reapply every 2 hours as needed. Call for new or changing lesions.  Staying in the shade or wearing long sleeves, sun glasses (UVA+UVB protection) and wide brim hats (4-inch brim around the entire circumference of the hat) are also recommended for sun protection.     Melanoma ABCDEs  Melanoma is the most dangerous type of skin cancer, and is the leading cause of death from skin disease.  You are more likely to develop melanoma if you: Have light-colored skin, light-colored eyes, or red or blond hair Spend a lot of time in the sun Tan regularly, either outdoors or in a tanning bed Have had blistering sunburns, especially during childhood Have a close family member who has had a melanoma Have atypical moles or large birthmarks  Early detection of melanoma is key since treatment is typically straightforward and cure rates are extremely high if we catch it early.   The first sign of melanoma is often a change in a mole or a new dark spot.  The ABCDE system is a way of remembering the signs of melanoma.  A for asymmetry:  The two halves do not match. B for border:  The edges of the growth are irregular. C for color:  A mixture of colors are present instead of an even brown color. D for diameter:  Melanomas are usually (but not always) greater than 46m - the size of a pencil eraser. E for evolution:  The spot keeps changing in size, shape, and color.  Please check your skin once per month between visits. You can use a small mirror in front and a large mirror behind you to keep an eye on the back side or your body.   If you see any new or changing lesions before your next follow-up, please call to schedule a visit.  Please continue daily skin protection including broad spectrum sunscreen SPF 30+ to sun-exposed areas, reapplying every 2 hours as needed when you're outdoors.   Staying in the shade or wearing long sleeves,  sun glasses (UVA+UVB protection) and wide brim hats (4-inch brim around the entire circumference of the hat) are also recommended for sun protection.     If You Need Anything After Your Visit  If you have any questions or concerns for your doctor, please call our main line at [phone] press option 4 to reach your doctor's medical assistant. If no one answers, please leave a voicemail as directed and we will return your call as soon as possible. Messages left after 4 pm will be answered the following business day.   You may also send [place] message via [place] We typically respond to MyChart messages within 1-2 business days.  For prescription refills, please ask your pharmacy to contact our office. Our fax number is [phone]  If you have an urgent issue when the clinic is closed that cannot wait until the next business day, you can page your doctor at the number below.    Please note that while we do our best to be available for urgent issues outside of office hours, we are not available 24/7.   If you have an urgent issue and are unable to reach [place] you may choose to seek medical care at your doctor's office, retail clinic, urgent care center, or emergency room.  If you have a medical emergency, please immediately call 911 or go to the emergency department.  Pager Numbers  - Dr. KNehemiah Massed  [phone]  - Dr. Laurence Ferrari: [phone]  - Dr. Nicole Kindred: [phone]  In the event of inclement weather, please call our main line at [phone] for an update on the status of any delays or closures.  Dermatology Medication Tips: Please keep the boxes that topical medications come in in order to help keep track of the instructions about where and how to use these. Pharmacies typically print the medication instructions only on the boxes and not directly on the medication tubes.   If your medication is too expensive, please contact our office at [phone] option 4 or send [place] a message  through [place].   We are unable to tell what your co-pay for medications will be in advance as this is different depending on your insurance coverage. However, we may be able to find a substitute medication at lower cost or fill out paperwork to get insurance to cover a needed medication.   If a prior authorization is required to get your medication covered by your insurance company, please allow [place] 1-2 business days to complete this process.  Drug prices often vary depending on where the prescription is filled and some pharmacies may offer cheaper prices.  The website www.goodrx.com contains coupons for medications through different pharmacies. The prices here do not account for what the cost may be with help from insurance (it may be cheaper with your insurance), but the website can give you the price if you did not use any insurance.  - You can print the associated coupon and take it with your prescription to the pharmacy.  - You may also stop by our office during regular business hours and pick up a GoodRx coupon card.  - If you need your prescription sent electronically to a different pharmacy, notify our office through [facility] or by phone at [phone] option 4.     Si Usted Necesita Algo Despus de Su Visita  Tambin puede enviarnos un mensaje a travs de Pharmacist, community. Por lo general respondemos a los mensajes de MyChart en el transcurso de 1 a 2 das hbiles.  Para renovar recetas, por favor pida a su farmacia que se ponga en contacto con nuestra oficina. Harland Dingwall de fax es [place] [phone].  Si tiene un asunto urgente cuando la clnica est cerrada y que no puede esperar hasta el siguiente da hbil, puede llamar/localizar a su doctor(a) al nmero que aparece a continuacin.   Por favor, tenga en cuenta que aunque hacemos todo lo posible para estar disponibles para asuntos urgentes fuera del horario de [place], no estamos disponibles las 24 horas del da, los 7 das de  la [place].   Si tiene un problema urgente y no puede comunicarse con nosotros, puede optar por buscar atencin mdica  en el consultorio de su doctor(a), en una clnica privada, en un centro de atencin urgente o en una sala de emergencias.  Si tiene Engineering geologist, por favor llame inmediatamente al 911 o vaya a la sala de emergencias.  Nmeros de bper  - Dr. Nehemiah Massed: [phone]  - Dra. Moye: [phone]  - Dra. Nicole Kindred: [phone]  En caso de inclemencias del [place], por favor llame a Johnsie Kindred principal al [phone] para una actualizacin sobre el [place] de cualquier retraso o cierre.  Consejos para la medicacin en dermatologa: Por favor, guarde las cajas en las que vienen los medicamentos de uso tpico para ayudarle a seguir las instrucciones sobre dnde y cmo usarlos. Las farmacias generalmente imprimen las instrucciones del medicamento slo en  las cajas y no directamente en los tubos del [place].   Si su medicamento es muy caro, por favor, pngase en contacto con Zigmund Daniel llamando al [phone] y presione la opcin 4 o envenos un mensaje a travs de Pharmacist, community.   No podemos decirle cul ser su copago por los medicamentos por adelantado ya que esto es diferente dependiendo de la cobertura de su seguro. Sin embargo, es posible que podamos encontrar un medicamento sustituto a Electrical engineer un formulario para que el seguro cubra el medicamento que se considera necesario.   Si se requiere una autorizacin previa para que su compaa de seguros [place] su medicamento, por favor permtanos de 1 a 2 das hbiles para completar este proceso.  Los precios de los medicamentos varan con frecuencia dependiendo del Environmental consultant de dnde se surte la receta y alguna farmacias pueden ofrecer precios ms baratos.  El sitio web www.goodrx.com tiene cupones para medicamentos de Airline pilot. Los precios aqu no tienen en cuenta lo que podra costar con la ayuda  del seguro (puede ser ms barato con su seguro), pero el sitio web puede darle el precio si no utiliz Research scientist (physical sciences).  - Puede imprimir el cupn correspondiente y llevarlo con su receta a la farmacia.  - Tambin puede pasar por nuestra oficina durante el horario de atencin regular y Charity fundraiser una tarjeta de cupones de GoodRx.  - Si necesita que su receta se enve electrnicamente a una farmacia diferente, informe a nuestra oficina a travs de MyChart de [place] o por telfono llamando al [phone] y presione la opcin 4.

## 2021-10-08 NOTE — Progress Notes (Signed)
New Patient Visit  Subjective  Linda Ware is a 51 y.o. female who presents for the following: Annual Exam (Skin cancer screening. Full body. 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 Areas of concern today). The patient presents for Total-Body Skin Exam (TBSE) for skin cancer screening and mole check.  The patient has spots, moles and lesions to be evaluated, some may be new or changing and the patient has concerns that these could be cancer.  Son with patient.   Review of Systems: No other skin or systemic complaints except as noted in HPI or Assessment and Plan.  Objective  Well appearing patient in no apparent distress; mood and affect are within normal limits.  A full examination was performed including scalp, head, eyes, ears, nose, lips, neck, chest, axillae, abdomen, back, buttocks, bilateral upper extremities, bilateral lower extremities, hands, feet, fingers, toes, fingernails, and toenails. All findings within normal limits unless otherwise noted below.  face/cheeks Mid face erythema with telangiectasias   face Scattered tan macules.    Assessment & Plan   History of Dysplastic Nevi. Multiple sites, see history - No evidence of recurrence today - Recommend regular full body skin exams - Recommend daily broad spectrum sunscreen SPF 30+ to sun-exposed areas, reapply every 2 hours as needed.  - Call if any new or changing lesions are noted between office visits   Lentigines - Scattered tan macules - Due to sun exposure - Benign-appearing, observe - Recommend daily broad spectrum sunscreen SPF 30+ to sun-exposed areas, reapply every 2 hours as needed. - Call for any changes  Seborrheic Keratoses - Stuck-on, waxy, tan-brown papules and/or plaques  - Benign-appearing - Discussed benign etiology and prognosis. - Observe - Call for any changes  Melanocytic Nevi - Tan-brown and/or pink-flesh-colored symmetric macules and papules - Benign appearing on exam today - Observation -  Call clinic for new or changing moles - Recommend daily use of broad spectrum spf 30+ sunscreen to sun-exposed areas.   Hemangiomas - Red papules - Discussed benign nature - Observe - Call for any changes  Actinic Damage - Chronic condition, secondary to cumulative UV/sun exposure - diffuse scaly erythematous macules with underlying dyspigmentation - Recommend daily broad spectrum sunscreen SPF 30+ to sun-exposed areas, reapply every 2 hours as needed.  - Staying in the shade or wearing long sleeves, sun glasses (UVA+UVB protection) and wide brim hats (4-inch brim around the entire circumference of the hat) are also recommended for sun protection.  - Call for new or changing lesions.  Skin cancer screening performed today.  Rosacea face/cheeks  Rosacea is a chronic progressive skin condition usually affecting the face of adults, causing redness and/or acne bumps. It is treatable but not curable. It sometimes affects the eyes (ocular rosacea) as well. It may respond to topical and/or systemic medication and can flare with stress, sun exposure, alcohol, exercise and some foods.  Daily application of broad spectrum spf 30+ sunscreen to face is recommended to reduce flares.  Patient deferred treatment at this time.   Lentigines face  Discussed the treatment option of BBL/laser.  Typically we recommend 1-3 treatment sessions about 5-8 weeks apart for best results.  The patient's condition may require "maintenance treatments" in the future.  The fee for BBL / laser treatments is $350 per treatment session for the whole face.  A fee can be quoted for other parts of the body. Insurance typically does not pay for BBL/laser treatments and therefore the fee is an out-of-pocket cost.  Return in about 1 year (around 10/09/2022) for TBSE, HxDN.  I, Emelia Salisbury, CMA, am acting as scribe for Sarina Ser, MD. Documentation: I have reviewed the above documentation for accuracy and completeness,  and I agree with the above.  Sarina Ser, MD

## 2021-10-18 ENCOUNTER — Encounter: Payer: Self-pay | Admitting: Dermatology

## 2021-10-18 ENCOUNTER — Encounter (HOSPITAL_COMMUNITY): Payer: Self-pay

## 2021-10-18 ENCOUNTER — Emergency Department
Admission: EM | Admit: 2021-10-18 | Discharge: 2021-10-18 | Disposition: A | Discharge status: Home / Self Care | Payer: Commercial Managed Care - HMO | Attending: Emergency Medicine | Admitting: Emergency Medicine

## 2021-10-18 ENCOUNTER — Emergency Department (HOSPITAL_COMMUNITY)
Admission: EM | Admit: 2021-10-18 | Discharge: 2021-10-18 | Discharge status: Against Medical Advice | Payer: Commercial Managed Care - HMO | Attending: Physician Assistant | Admitting: Physician Assistant

## 2021-10-18 ENCOUNTER — Emergency Department (HOSPITAL_COMMUNITY): Payer: Commercial Managed Care - HMO

## 2021-10-18 ENCOUNTER — Other Ambulatory Visit: Payer: Self-pay

## 2021-10-18 DIAGNOSIS — M79622 Pain in left upper arm: Secondary | ICD-10-CM | POA: Diagnosis not present

## 2021-10-18 DIAGNOSIS — Y9389 Activity, other specified: Secondary | ICD-10-CM | POA: Insufficient documentation

## 2021-10-18 DIAGNOSIS — Y999 Unspecified external cause status: Secondary | ICD-10-CM | POA: Insufficient documentation

## 2021-10-18 DIAGNOSIS — S4992XA Unspecified injury of left shoulder and upper arm, initial encounter: Secondary | ICD-10-CM | POA: Diagnosis present

## 2021-10-18 DIAGNOSIS — M25522 Pain in left elbow: Secondary | ICD-10-CM | POA: Diagnosis not present

## 2021-10-18 DIAGNOSIS — W19XXXA Unspecified fall, initial encounter: Secondary | ICD-10-CM | POA: Insufficient documentation

## 2021-10-18 DIAGNOSIS — W108XXA Fall (on) (from) other stairs and steps, initial encounter: Secondary | ICD-10-CM | POA: Diagnosis not present

## 2021-10-18 DIAGNOSIS — Y9289 Other specified places as the place of occurrence of the external cause: Secondary | ICD-10-CM | POA: Diagnosis not present

## 2021-10-18 DIAGNOSIS — S42202A Unspecified fracture of upper end of left humerus, initial encounter for closed fracture: Secondary | ICD-10-CM

## 2021-10-18 DIAGNOSIS — Z5321 Procedure and treatment not carried out due to patient leaving prior to being seen by health care provider: Secondary | ICD-10-CM | POA: Insufficient documentation

## 2021-10-18 MED ORDER — ONDANSETRON 4 MG PO TBDP: 4.0000 mg | ORAL_TABLET | Freq: Three times a day (TID) | ORAL | 0 refills | Status: DC | PRN

## 2021-10-18 MED ORDER — OXYCODONE-ACETAMINOPHEN 5-325 MG PO TABS
1.0000 | ORAL_TABLET | Freq: Once | ORAL | Status: AC
  Administered 2021-10-18: 1 via ORAL
  Filled 2021-10-18: qty 1

## 2021-10-18 MED ORDER — OXYCODONE-ACETAMINOPHEN 5-325 MG PO TABS: 1.0000 | ORAL_TABLET | Freq: Three times a day (TID) | ORAL | 0 refills | Status: DC | PRN

## 2021-10-18 MED ORDER — ONDANSETRON 4 MG PO TBDP
4.0000 mg | ORAL_TABLET | Freq: Once | ORAL | Status: AC
  Administered 2021-10-18: 4 mg via ORAL
  Filled 2021-10-18: qty 1

## 2021-10-18 NOTE — ED Notes (Signed)
Patient states she is leaving d/t wait time 

## 2021-10-18 NOTE — ED Triage Notes (Signed)
[place] going up steps and fell landing on left arm.  Pain to elbow and humerus.

## 2021-10-18 NOTE — ED Provider Notes (Signed)
[facility] Provider Note    Event Date/Time   First MD Initiated Contact with Patient 10/18/21 2252     (approximate)   History   Arm Injury   HPI  Linda Ware is a 52 y.o. female who presents for evaluation after a fall.  Patient was apparently at [facility], had x-rays performed but states that after waiting for several hours she was not willing to wait any longer.  She received a tramadol there but because her pain was significant she has stopped at our emergency department for evaluation     Physical Exam   Triage Vital Signs: ED Triage Vitals  Enc Vitals Group     BP 10/18/21 2236 (!) 148/73     Pulse Rate 10/18/21 2236 72     Resp 10/18/21 2236 20     Temp 10/18/21 2236 98.1 F (36.7 C)     Temp Source 10/18/21 2236 Oral     SpO2 10/18/21 2236 93 %     Weight 10/18/21 2236 117.9 kg (260 lb)     Height 10/18/21 2236 1.626 m ('5\' 4"'$ )     Head Circumference --      Peak Flow --      Pain Score 10/18/21 2247 8     Pain Loc --      Pain Edu? --      Excl. in [place]? --     Most recent vital signs: Vitals:   10/18/21 2236  BP: (!) 148/73  Pulse: 72  Resp: 20  Temp: 98.1 F (36.7 C)  SpO2: 93%     General: Awake, no distress.  CV:  Good peripheral perfusion.  Resp:  Normal effort.  Abd:  No distention.  Other:  Left arm is in a sling, she describes some tingling in the forearm but otherwise is neurologically intact.  Tenderness along the humeral head   ED Results / Procedures / Treatments   Labs (all labs ordered are listed, but only abnormal results are displayed) Labs Reviewed - No data to display   EKG     RADIOLOGY Viewed and interpreted x-ray of the left arm, consistent with humeral neck fracture    PROCEDURES:  Critical Care performed:   Procedures   MEDICATIONS ORDERED IN ED: Medications  oxyCODONE-acetaminophen (PERCOCET/ROXICET) 5-325 MG per tablet 1 tablet (1 tablet Oral Given 10/18/21  2251)  ondansetron (ZOFRAN-ODT) disintegrating tablet 4 mg (4 mg Oral Given 10/18/21 2251)     IMPRESSION / MDM / [place] / ED COURSE  I reviewed the triage vital signs and the nursing notes. Patient's presentation is most consistent with acute complicated illness / injury requiring diagnostic workup.   Patient presents after a fall, x-rays consistent with humeral neck fracture, comminuted.  Patient is in a sling, we will increase strength of pain medication, I have referred her to orthopedics       FINAL CLINICAL IMPRESSION(S) / ED DIAGNOSES   Final diagnoses:  Closed fracture of proximal end of left humerus, unspecified fracture morphology, initial encounter     Rx / DC Orders   ED Discharge Orders          Ordered    oxyCODONE-acetaminophen (PERCOCET) 5-325 MG tablet  Every 8 hours PRN        10/18/21 2254    ondansetron (ZOFRAN-ODT) 4 MG disintegrating tablet  Every 8 hours PRN        10/18/21 2254  Note:  This document was prepared using Dragon voice recognition software and may include unintentional dictation errors.   Lavonia Drafts, MD 10/18/21 2308

## 2021-10-18 NOTE — ED Notes (Signed)
Dc ppw provided, Followup reviewed. Pt verbal consent for dc provided.

## 2021-10-18 NOTE — ED Triage Notes (Signed)
Pt presents today from home when she fell landing with her arm underneath her body. Pt felt a "snap". Pt had x[facility] at [facility], but did not stay to be seen by a doctor. Pt was given a sling at [facility]. PT stating pain in L upper arm from shoulder to elbow. Pt took tramadol enroute here.

## 2021-10-19 ENCOUNTER — Ambulatory Visit
Admission: RE | Admit: 2021-10-19 | Discharge: 2021-10-19 | Disposition: A | Discharge status: Home / Self Care | Payer: Commercial Managed Care - HMO | Source: Ambulatory Visit | Attending: Orthopedic Surgery | Admitting: Orthopedic Surgery

## 2021-10-19 ENCOUNTER — Other Ambulatory Visit: Payer: Self-pay | Admitting: Orthopedic Surgery

## 2021-10-19 DIAGNOSIS — S42202A Unspecified fracture of upper end of left humerus, initial encounter for closed fracture: Secondary | ICD-10-CM | POA: Insufficient documentation

## 2021-12-16 ENCOUNTER — Encounter (INDEPENDENT_AMBULATORY_CARE_PROVIDER_SITE_OTHER): Payer: Self-pay

## 2022-01-13 DIAGNOSIS — M25512 Pain in left shoulder: Secondary | ICD-10-CM | POA: Insufficient documentation

## 2022-02-01 ENCOUNTER — Other Ambulatory Visit: Payer: Self-pay | Admitting: Urology

## 2022-03-16 ENCOUNTER — Other Ambulatory Visit: Payer: Self-pay | Admitting: Orthopedic Surgery

## 2022-03-16 DIAGNOSIS — M25512 Pain in left shoulder: Secondary | ICD-10-CM

## 2022-03-23 ENCOUNTER — Other Ambulatory Visit: Payer: Self-pay | Admitting: Orthopaedic Surgery

## 2022-03-23 DIAGNOSIS — M25512 Pain in left shoulder: Secondary | ICD-10-CM

## 2022-03-24 ENCOUNTER — Ambulatory Visit
Admission: RE | Admit: 2022-03-24 | Discharge: 2022-03-24 | Disposition: A | Discharge status: Home / Self Care | Payer: Commercial Managed Care - HMO | Source: Ambulatory Visit | Attending: Orthopedic Surgery | Admitting: Orthopedic Surgery

## 2022-03-24 DIAGNOSIS — M25512 Pain in left shoulder: Secondary | ICD-10-CM

## 2022-03-24 NOTE — Progress Notes (Signed)
Anesthesia Review:  PCP: Cardiologist : Chest x-ray : EKG : Echo : Stress test: Cardiac Cath :  Activity level:  Sleep Study/ CPAP : Fasting Blood Sugar :      / Checks Blood Sugar -- times a day:   Blood Thinner/ Instructions /Last Dose: ASA / Instructions/ Last Dose :

## 2022-03-29 ENCOUNTER — Encounter (HOSPITAL_COMMUNITY)
Admission: RE | Admit: 2022-03-29 | Discharge: 2022-03-29 | Disposition: A | Discharge status: Home / Self Care | Payer: Commercial Managed Care - HMO | Source: Ambulatory Visit | Attending: Family Medicine | Admitting: Family Medicine

## 2022-04-05 NOTE — Patient Instructions (Addendum)
DUE TO COVID-19 ONLY TWO VISITORS  (aged 50 and older)  ARE ALLOWED TO COME WITH YOU AND STAY IN THE WAITING ROOM ONLY DURING PRE OP AND PROCEDURE.   **NO VISITORS ARE ALLOWED IN THE SHORT STAY AREA OR RECOVERY ROOM!!**  IF YOU WILL BE ADMITTED INTO THE HOSPITAL YOU ARE ALLOWED ONLY FOUR SUPPORT PEOPLE DURING VISITATION HOURS ONLY (7 AM -8PM)   The support person(s) must pass our screening, gel in and out, and wear a mask at all times, including in the patient's room. Patients must also wear a mask when staff or their support person are in the room. Visitors GUEST BADGE MUST BE WORN VISIBLY  One adult visitor may remain with you overnight and MUST be in the room by 8 P.M.     Your procedure is scheduled on: 04/23/22   Report to [facility] Main Entrance    Report to admitting at : 5:15 AM   Call this number if you have problems the morning of surgery [phone]  Clear liquids starting the day before surgery until : 4:30 AM DAY OF SURGERY. Drink plenty fluid the day of the prep.  Water Black Coffee (sugar ok, NO MILK/CREAM OR CREAMERS)  Tea (sugar ok, NO MILK/CREAM OR CREAMERS) regular and decaf                             Plain Jell-O (NO RED)                                           Fruit ices (not with fruit pulp, NO RED)                                     Popsicles (NO RED)                                                                  Juice: apple, WHITE grape, WHITE cranberry Sports drinks like Gatorade (NO RED)  FOLLOW BOWEL PREP AND ANY ADDITIONAL PRE OP INSTRUCTIONS YOU RECEIVED FROM YOUR SURGEON'S OFFICE!!!   MIX 1/2 BOTTLE OF MIRALAX WITH 32 OUNCES OF GATORADE.  AT NOON START DRINKING 1 GLASS EVERY 15-30 MINUTES UNTIL FINISHED.  TAKE 2 COLACE TABLETS.  AT 6PM MIX 1/2 BOTTLE OF MIRALAX WITH 32 OUNCES OF GATORADE.  DRINK 1 GLASS EVERY 15-30 MINUTES UNTIL FINISHED.  TAKE 2 COLACE TABLETS.   Oral Hygiene is also important to reduce your risk of infection.                                     Remember - BRUSH YOUR TEETH THE MORNING OF SURGERY WITH YOUR REGULAR TOOTHPASTE   Do NOT smoke after Midnight   Take these medicines the morning of surgery with A SIP OF WATER:levothyroxine.Use inhalers as usual.   DO NOT TAKE ANY ORAL DIABETIC MEDICATIONS DAY OF YOUR SURGERY  Bring CPAP mask and tubing day of surgery.  You may not have any metal on your body including hair pins, jewelry, and body piercing             Do not wear make-up, lotions, powders, perfumes/cologne, or deodorant  Do not wear nail polish including gel and S&S, artificial/acrylic nails, or any other type of covering on natural nails including finger and toenails. If you have artificial nails, gel coating, etc. that needs to be removed by a nail salon please have this removed prior to surgery or surgery may need to be canceled/ delayed if the surgeon/ anesthesia feels like they are unable to be safely monitored.   Do not shave  48 hours prior to surgery.    Do not bring valuables to the hospital. [place].   Contacts, dentures or bridgework may not be worn into surgery.   Bring small overnight bag day of surgery.   DO NOT [place]. PHARMACY WILL DISPENSE MEDICATIONS LISTED ON YOUR MEDICATION LIST TO YOU DURING YOUR ADMISSION [place]!    Patients discharged on the day of surgery will not be allowed to drive home.  Someone NEEDS to stay with you for the first 24 hours after anesthesia.   Special Instructions: Bring a copy of your healthcare power of attorney and living will documents         the day of surgery if you haven't scanned them before.              Please read over the following fact sheets you were given: IF YOU HAVE QUESTIONS ABOUT YOUR PRE-OP INSTRUCTIONS PLEASE CALL [phone]    [facility] - Preparing for Surgery Before surgery, you can play an  important role.  Because skin is not sterile, your skin needs to be as free of germs as possible.  You can reduce the number of germs on your skin by washing with CHG (chlorahexidine gluconate) soap before surgery.  CHG is an antiseptic cleaner which kills germs and bonds with the skin to continue killing germs even after washing. Please DO NOT use if you have an allergy to CHG or antibacterial soaps.  If your skin becomes reddened/irritated stop using the CHG and inform your nurse when you arrive at Short Stay. Do not shave (including legs and underarms) for at least 48 hours prior to the first CHG shower.  You may shave your face/neck. Please follow these instructions carefully:  1.  Shower with CHG Soap the night before surgery and the  morning of Surgery.  2.  If you choose to wash your hair, wash your hair first as usual with your  normal  shampoo.  3.  After you shampoo, rinse your hair and body thoroughly to remove the  shampoo.                           4.  Use CHG as you would any other liquid soap.  You can apply chg directly  to the skin and wash                       Gently with a scrungie or clean washcloth.  5.  Apply the CHG Soap to your body ONLY FROM THE NECK DOWN.   Do not use on face/ open  Wound or open sores. Avoid contact with eyes, ears mouth and genitals (private parts).                       Wash face,  Genitals (private parts) with your normal soap.             6.  Wash thoroughly, paying special attention to the area where your surgery  will be performed.  7.  Thoroughly rinse your body with warm water from the neck down.  8.  DO NOT shower/wash with your normal soap after using and rinsing off  the CHG Soap.                9.  Pat yourself dry with a clean towel.            10.  Wear clean pajamas.            11.  Place clean sheets on your bed the night of your first shower and do not  sleep with pets. Day of Surgery : Do not apply any  lotions/deodorants the morning of surgery.  Please wear clean clothes to the hospital/surgery center.  FAILURE TO FOLLOW THESE INSTRUCTIONS MAY RESULT IN THE CANCELLATION OF YOUR SURGERY PATIENT SIGNATURE_________________________________  NURSE SIGNATURE__________________________________  ________________________________________________________________________

## 2022-04-06 ENCOUNTER — Encounter (HOSPITAL_COMMUNITY)
Admission: RE | Admit: 2022-04-06 | Discharge: 2022-04-06 | Disposition: A | Discharge status: Home / Self Care | Payer: Commercial Managed Care - HMO | Source: Ambulatory Visit | Attending: Urology | Admitting: Urology

## 2022-04-06 ENCOUNTER — Other Ambulatory Visit: Payer: Self-pay

## 2022-04-06 ENCOUNTER — Encounter (HOSPITAL_COMMUNITY): Payer: Self-pay

## 2022-04-06 VITALS — BP 126/89 | HR 79 | Temp 98.3°F | Ht 64.0 in | Wt 248.0 lb

## 2022-04-06 DIAGNOSIS — R7303 Prediabetes: Secondary | ICD-10-CM | POA: Diagnosis not present

## 2022-04-06 DIAGNOSIS — Z01818 Encounter for other preprocedural examination: Secondary | ICD-10-CM | POA: Diagnosis present

## 2022-04-06 DIAGNOSIS — I2510 Atherosclerotic heart disease of native coronary artery without angina pectoris: Secondary | ICD-10-CM | POA: Diagnosis not present

## 2022-04-06 HISTORY — DX: Hypothyroidism, unspecified: E03.9

## 2022-04-06 HISTORY — DX: Prediabetes: R73.03

## 2022-04-06 HISTORY — DX: Personal history of urinary calculi: Z87.442

## 2022-04-06 LAB — HEMOGLOBIN A1C
Hgb A1c MFr Bld: 5.9 % — ABNORMAL HIGH (ref 4.8–5.6)
Mean Plasma Glucose: 123 mg/dL

## 2022-04-06 LAB — BASIC METABOLIC PANEL
Anion gap: 9 (ref 5–15)
BUN: 17 mg/dL (ref 6–20)
CO2: 25 mmol/L (ref 22–32)
Calcium: 9.4 mg/dL (ref 8.9–10.3)
Chloride: 106 mmol/L (ref 98–111)
Creatinine, Ser: 0.87 mg/dL (ref 0.44–1.00)
GFR, Estimated: >60 mL/min (ref >60)
Glucose, Bld: 99 mg/dL (ref 70–99)
Potassium: 4 mmol/L (ref 3.5–5.1)
Sodium: 140 mmol/L (ref 135–145)

## 2022-04-06 LAB — CBC
HCT: 38.3 % (ref 36.0–46.0)
Hemoglobin: 11.5 g/dL — ABNORMAL LOW (ref 12.0–15.0)
MCH: 24.6 pg — ABNORMAL LOW (ref 26.0–34.0)
MCHC: 30 g/dL (ref 30.0–36.0)
MCV: 81.8 fL (ref 80.0–100.0)
Platelets: 346 10*3/uL (ref 150–400)
RBC: 4.68 MIL/uL (ref 3.87–5.11)
RDW: 15.2 % (ref 11.5–15.5)
WBC: 13.1 10*3/uL — ABNORMAL HIGH (ref 4.0–10.5)
nRBC: 0 % (ref 0.0–0.2)

## 2022-04-06 LAB — GLUCOSE, CAPILLARY: Glucose-Capillary: 108 mg/dL — ABNORMAL HIGH (ref 70–99)

## 2022-04-06 NOTE — Progress Notes (Signed)
For Short Stay: [facility] appointment date:  Bowel Prep reminder:   For Anesthesia: PCP - Dr. Mia Creek Cardiologist -   Chest x-ray -  EKG -  Stress Test -  ECHO -  Cardiac Cath -  Pacemaker/ICD device last checked: Pacemaker orders received: Device Rep notified:  Spinal Cord Stimulator:  Sleep Study - Yes CPAP - NO  Fasting Blood Sugar - N/A Checks Blood Sugar _____ times a day Date and result of last Hgb A1c-  Last dose of GLP1 agonist-  GLP1 instructions:   Last dose of SGLT-2 inhibitors-  SGLT-2 instructions:   Blood Thinner Instructions: Aspirin Instructions: Last Dose:  Activity level: Can go up a flight of stairs and activities of daily living without stopping and without chest pain and/or shortness of breath   Able to exercise without chest pain and/or shortness of breath   Unable to go up a flight of stairs without chest pain and/or shortness of breath     Anesthesia review: Hx: Heart murmur,OSA(NO CPAP),Pre-DIA.  Patient denies shortness of breath, fever, cough and chest pain at PAT appointment   Patient verbalized understanding of instructions that were given to them at the PAT appointment. Patient was also instructed that they will need to review over the PAT instructions again at home before surgery.

## 2022-04-07 LAB — URINE CULTURE: Culture: NO GROWTH

## 2022-04-12 ENCOUNTER — Ambulatory Visit: Payer: Medicaid Other | Admitting: Dermatology

## 2022-04-12 VITALS — BP 142/85 | HR 74

## 2022-04-12 DIAGNOSIS — L821 Other seborrheic keratosis: Secondary | ICD-10-CM

## 2022-04-12 DIAGNOSIS — L918 Other hypertrophic disorders of the skin: Secondary | ICD-10-CM

## 2022-04-12 DIAGNOSIS — L820 Inflamed seborrheic keratosis: Secondary | ICD-10-CM | POA: Diagnosis not present

## 2022-04-12 DIAGNOSIS — L578 Other skin changes due to chronic exposure to nonionizing radiation: Secondary | ICD-10-CM

## 2022-04-12 NOTE — Patient Instructions (Signed)
Due to recent changes in healthcare laws, you may see results of your pathology and/or laboratory studies on MyChart before the doctors have had a chance to review them. We understand that in some cases there may be results that are confusing or concerning to you. Please understand that not all results are received at the same time and often the doctors may need to interpret multiple results in order to provide you with the best plan of care or course of treatment. Therefore, we ask that you please give [place] 2 business days to thoroughly review all your results before contacting the office for clarification. Should we see a critical lab result, you will be contacted sooner.   If You Need Anything After Your Visit  If you have any questions or concerns for your doctor, please call our main line at [phone] and press option 4 to reach your doctor's medical assistant. If no one answers, please leave a voicemail as directed and we will return your call as soon as possible. Messages left after 4 pm will be answered the following business day.   You may also send [place] a message via [place]. We typically respond to MyChart messages within 1-2 business days.  For prescription refills, please ask your pharmacy to contact our office. Our fax number is [phone].  If you have an urgent issue when the clinic is closed that cannot wait until the next business day, you can page your doctor at the number below.    Please note that while we do our best to be available for urgent issues outside of office hours, we are not available 24/7.   If you have an urgent issue and are unable to reach [place], you may choose to seek medical care at your doctor's office, retail clinic, urgent care center, or emergency room.  If you have a medical emergency, please immediately call 911 or go to the emergency department.  Pager Numbers  - Dr. Nehemiah Massed: [phone]  - Dr. Laurence Ferrari: [phone]  - Dr. Nicole Kindred:  [phone]  In the event of inclement weather, please call our main line at [phone] for an update on the status of any delays or closures.  Dermatology Medication Tips: Please keep the boxes that topical medications come in in order to help keep track of the instructions about where and how to use these. Pharmacies typically print the medication instructions only on the boxes and not directly on the medication tubes.   If your medication is too expensive, please contact our office at [phone] option 4 or send [place] a message through [place].   We are unable to tell what your co-pay for medications will be in advance as this is different depending on your insurance coverage. However, we may be able to find a substitute medication at lower cost or fill out paperwork to get insurance to cover a needed medication.   If a prior authorization is required to get your medication covered by your insurance company, please allow [place] 1-2 business days to complete this process.  Drug prices often vary depending on where the prescription is filled and some pharmacies may offer cheaper prices.  The website www.goodrx.com contains coupons for medications through different pharmacies. The prices here do not account for what the cost may be with help from insurance (it may be cheaper with your insurance), but the website can give you the price if you did not use any insurance.  - You can print the associated coupon and take it with  your prescription to the pharmacy.  - You may also stop by our office during regular business hours and pick up a GoodRx coupon card.  - If you need your prescription sent electronically to a different pharmacy, notify our office through [facility] or by phone at [phone] option 4.     Si Usted Necesita Algo Despus de Su Visita  Tambin puede enviarnos un mensaje a travs de Pharmacist, community. Por lo general respondemos a los mensajes de MyChart en el transcurso de 1 a 2  das hbiles.  Para renovar recetas, por favor pida a su farmacia que se ponga en contacto con nuestra oficina. Harland Dingwall de fax es [place] [phone].  Si tiene un asunto urgente cuando la clnica est cerrada y que no puede esperar hasta el siguiente da hbil, puede llamar/localizar a su doctor(a) al nmero que aparece a continuacin.   Por favor, tenga en cuenta que aunque hacemos todo lo posible para estar disponibles para asuntos urgentes fuera del horario de [place], no estamos disponibles las 24 horas del da, los 7 das de la [place].   Si tiene un problema urgente y no puede comunicarse con nosotros, puede optar por buscar atencin mdica  en el consultorio de su doctor(a), en una clnica privada, en un centro de atencin urgente o en una sala de emergencias.  Si tiene Engineering geologist, por favor llame inmediatamente al 911 o vaya a la sala de emergencias.  Nmeros de bper  - Dr. Nehemiah Massed: [phone]  - Dra. Moye: [phone]  - Dra. Nicole Kindred: [phone]  En caso de inclemencias del [place], por favor llame a Johnsie Kindred principal al [phone] para una actualizacin sobre el [place] de cualquier retraso o cierre.  Consejos para la medicacin en dermatologa: Por favor, guarde las cajas en las que vienen los medicamentos de uso tpico para ayudarle a seguir las instrucciones sobre dnde y cmo usarlos. Las farmacias generalmente imprimen las instrucciones del medicamento slo en las cajas y no directamente en los tubos del [place].   Si su medicamento es muy caro, por favor, pngase en contacto con Zigmund Daniel llamando al [phone] y presione la opcin 4 o envenos un mensaje a travs de Pharmacist, community.   No podemos decirle cul ser su copago por los medicamentos por adelantado ya que esto es diferente dependiendo de la cobertura de su seguro. Sin embargo, es posible que podamos encontrar un medicamento sustituto a Electrical engineer un formulario para que el  seguro cubra el medicamento que se considera necesario.   Si se requiere una autorizacin previa para que su compaa de seguros [place] su medicamento, por favor permtanos de 1 a 2 das hbiles para completar este proceso.  Los precios de los medicamentos varan con frecuencia dependiendo del Environmental consultant de dnde se surte la receta y alguna farmacias pueden ofrecer precios ms baratos.  El sitio web www.goodrx.com tiene cupones para medicamentos de Airline pilot. Los precios aqu no tienen en cuenta lo que podra costar con la ayuda del seguro (puede ser ms barato con su seguro), pero el sitio web puede darle el precio si no utiliz Research scientist (physical sciences).  - Puede imprimir el cupn correspondiente y llevarlo con su receta a la farmacia.  - Tambin puede pasar por nuestra oficina durante el horario de atencin regular y Charity fundraiser una tarjeta de cupones de GoodRx.  - Si necesita que su receta se enve electrnicamente a Chiropodist, informe a nuestra oficina a travs [place]  o por telfono llamando al [phone] y presione la opcin 4.

## 2022-04-12 NOTE — Progress Notes (Signed)
Follow-Up Visit   Subjective  Linda Ware is a 51 y.o. female who presents for the following: Irritated skin tags (In the groin - irritated, patient would like removed ). The patient has spots, moles and lesions to be evaluated, some may be new or changing and the patient has concerns that these could be cancer.  The following portions of the chart were reviewed this encounter and updated as appropriate:   Tobacco  Allergies  Meds  Problems  Med Hx  Surg Hx  Fam Hx     Review of Systems:  No other skin or systemic complaints except as noted in HPI or Assessment and Plan.  Objective  Well appearing patient in no apparent distress; mood and affect are within normal limits.  A focused examination was performed including the face, groin, and legs. Relevant physical exam findings are noted in the Assessment and Plan.  R med thigh x 2 (2) Erythematous stuck-on, waxy papule or plaque   Assessment & Plan  Skin tag (4) L inguinal crease x 4 Symptomatic, irritating, patient would like treated.  Procedure: Skin tag removal Informed consent:  Discussed risks (permanent scarring, infection, pain, bleeding, bruising, redness, and recurrence of the lesion) and benefits of the procedure, as well as the alternatives.  Patient is aware that skin tags are benign lesions, and their removal is often not considered medically necessary.  Informed consent was obtained. The area was prepared with isopropyl alcohol. Anesthesia:  lidocaine 1% with epinephrine was injected to achieve good local anesthesia Snip removal was performed.   Antibiotic ointment and a sterile dressing were applied.   The patient tolerated procedure well. The patient was instructed on post-op care.   Number of lesions removed:  1   Destruction of lesion - L inguinal crease x 4 Complexity: simple   Destruction method: cryotherapy   Informed consent: discussed and consent obtained   Timeout:  patient name, date of  birth, surgical site, and procedure verified Lesion destroyed using liquid nitrogen: Yes   Region frozen until ice ball extended beyond lesion: Yes   Outcome: patient tolerated procedure well with no complications   Post-procedure details: wound care instructions given   Additional details:  LN2 x 3  Inflamed seborrheic keratosis (2) R med thigh x 2 Symptomatic, irritating, patient would like treated.  Destruction of lesion - R med thigh x 2 Complexity: simple   Destruction method: cryotherapy   Informed consent: discussed and consent obtained   Timeout:  patient name, date of birth, surgical site, and procedure verified Lesion destroyed using liquid nitrogen: Yes   Region frozen until ice ball extended beyond lesion: Yes   Outcome: patient tolerated procedure well with no complications   Post-procedure details: wound care instructions given    Actinic Damage - chronic, secondary to cumulative UV radiation exposure/sun exposure over time - diffuse scaly erythematous macules with underlying dyspigmentation - Recommend daily broad spectrum sunscreen SPF 30+ to sun-exposed areas, reapply every 2 hours as needed.  - Recommend staying in the shade or wearing long sleeves, sun glasses (UVA+UVB protection) and wide brim hats (4-inch brim around the entire circumference of the hat). - Call for new or changing lesions.  Seborrheic Keratoses - Stuck-on, waxy, tan-brown papules and/or plaques  - Benign-appearing - Discussed benign etiology and prognosis. - Observe - Call for any changes  Return for appointment as scheduled.  Luther Redo, CMA, am acting as scribe for Sarina Ser, MD . Documentation: I have reviewed  the above documentation for accuracy and completeness, and I agree with the above.  Sarina Ser, MD

## 2022-04-21 ENCOUNTER — Ambulatory Visit
Admission: EM | Admit: 2022-04-21 | Discharge: 2022-04-21 | Disposition: A | Discharge status: Home / Self Care | Payer: Medicaid Other

## 2022-04-21 DIAGNOSIS — J069 Acute upper respiratory infection, unspecified: Secondary | ICD-10-CM | POA: Diagnosis not present

## 2022-04-21 NOTE — ED Triage Notes (Signed)
Pt. Presents to UC w/ c/o wheezing for the past 3 days. Pt. States she has a hx of asthma and is concerned. Pt. Also states she has surgery Friday and wants to make sure her wheezing Is not a concern.

## 2022-04-21 NOTE — Discharge Instructions (Signed)
You have been diagnosed with a viral upper respiratory infection based on your symptoms and exam. Viral illnesses cannot be treated with antibiotics - they are self limiting - and you should find your symptoms resolving within a few days. Get plenty of rest and non-caffeinated fluids.  We recommend you use over-the-counter medications for symptom control including Tylenol or ibuprofen for fever, chills or body aches, and cold/cough medication.  Saline mist spray is helpful for removing excess mucus from your nose.  Room humidifiers are helpful to ease breathing at night. You might also find relief of nasal/sinus congestion symptoms by using a nasal decongestant such as Sudafed sinus (pseudoephedrine).  You will need to obtain this medication from behind the pharmacist counter.  Speak to the pharmacist to verify that you are not duplicating medications with other over-the-counter formulations that you may be using.   Follow up here or with your primary care provider if your symptoms are worsening or not improving.

## 2022-04-21 NOTE — ED Provider Notes (Signed)
Linda Ware    CSN: [id number] Arrival date & time: 04/21/22  0820      History   Chief Complaint Chief Complaint  Patient presents with   Wheezing    HPI Linda Ware is a 51 y.o. female.    Wheezing   Patient presents to urgent care with report of recent "wheezing" following a presumed viral illness with fever and cough.  She states that today she feels good and symptoms improving.  She reports hearing "wheezing" when she lays down and states that it resolves after she coughs.  Informs provider of scheduled surgical procedure and wants to make sure she is still cleared for surgery.  Endorses history of asthma.  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 Atypical intraepidermal melanocytic neoplasm with mild to moderate atypia. Excised 03/18/2010, margins free.   B12 deficiency    Blood in stool    Chicken pox    Constipation    Crohn's disease ([place])    Cystocele    Depression    Dysplastic nevus 03/26/2008   Left side/waistline. Slight to moderate atypia and halo nevus effect.   Dysplastic nevus 02/03/2010   Left mid side, minimal atypia.   Dysplastic nevus 03/25/2010   Right volar wrist. Mild atypia, edges free.   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   Hypothyroidism    IBS (irritable bowel syndrome)    Incontinence of urine in female    Iron deficiency anemia    Joint pain    Lactose intolerance    Obesity    Obesity (BMI 30-39.9) 04/24/2012   Other specified disorders of thyroid    Plantar fasciitis    Pre-diabetes    Pregnant    [redacted]  weeks   Primary thyroid cancer ([place]) 2007   papillary   Rectocele    Rosacea    Sleep apnea    intermittent   Stomach ulcer    Swallowing difficulty    Swelling of both lower extremities    Thyroid cancer ([place])    Vitamin D deficiency     Patient Active Problem List   Diagnosis Date Noted   At risk for activity intolerance 05/20/2020   Crohn's disease with complication ([place]) 81/82/9937   Other hemorrhoids 04/10/2020   At risk for complication associated with hypotension 04/10/2020   Depression 03/20/2020   At risk for osteoporosis 03/20/2020   Prediabetes 03/06/2020   Other specified hypothyroidism 03/06/2020   Absolute anemia 03/06/2020   At risk for diabetes mellitus 03/06/2020   Mood disorder ([place]) 02/21/2020   Vitamin B12 deficiency 02/21/2020   Vitamin D deficiency 02/21/2020   Anemia 02/21/2020   OSA (obstructive sleep apnea) 02/21/2020   Diarrhea 05/12/2018   Other dysphagia 05/12/2018   Rectal bleeding 05/12/2018   Abnormal uterine bleeding 08/04/2017   Stress incontinence 08/20/2015   Vaginal pessary in situ 08/20/2015   Cystocele with small rectocele and uterine descent 08/20/2015   Cystocele or rectocele with incomplete uterine prolapse 01/29/2015  Hypersomnia with sleep apnea 10/02/2014   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/2014   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 Jill Side   HERNIA REPAIR     kidney stone removal     x multiple  occasions   THYROIDECTOMY  2007   tonsillectomy      OB History     Gravida  5   Para  5   Term  5   Preterm  0   AB  0   Living  1      SAB  0   IAB  0   Ectopic  0   Multiple      Live Births  1            Home Medications    Prior to Admission medications   Medication Sig Start Date End Date Taking? Authorizing Provider  albuterol (PROVENTIL) (2.5 MG/3ML) 0.083% nebulizer solution Take 2.5 mg by nebulization every 6 (six) hours as needed for wheezing or shortness of breath. 01/17/20   [provider]  albuterol (VENTOLIN HFA) 108 (90 Base) MCG/ACT inhaler Inhale 1-2 puffs into the lungs every 6 (six) hours as needed for wheezing or shortness of breath. 09/07/13   [provider]  Ascorbic Acid (VITAMIN C) 1000 MG tablet Take 1,000 mg by mouth daily.    [provider]  BIOTIN PO Take 10,000 mcg by mouth daily.    [provider]  Calcium Citrate (CITRACAL PO) Take 1 tablet by mouth daily.    [provider]  Cholecalciferol (VITAMIN D) 50 MCG (2000 UT) CAPS Take 2,000 Units by mouth daily.    [provider]  Cholecalciferol (VITAMIN D3) 125 MCG (5000 UT) TABS Take 1 tablet (5,000 Units total) by mouth daily. 03/06/20   Opalski, Deborah, DO  Evening Primrose Oil 500 MG CAPS Take 500 mg by mouth daily.    [provider]  famotidine (PEPCID) 20 MG tablet Take 20 mg by mouth at bedtime.    [provider]  Ferrous Sulfate (IRON) 325 (65 Fe) MG TABS Take 1 tablet (325 mg total) by mouth daily. Patient taking differently: Take 325 mg by mouth every 3 (three) days. 02/08/19 02/21/28  Carrie Mew, MD  fluticasone-salmeterol (ADVAIR) 250-50 MCG/ACT AEPB Inhale 1 puff into the lungs 2 (two) times daily as needed (shortness of breath). 03/03/22   [provider]  gabapentin (NEURONTIN) 100 MG capsule Take 100 mg by mouth at bedtime.    [provider]  levothyroxine (SYNTHROID)  200 MCG tablet Take 200 mcg by mouth daily before breakfast. 01/10/20   [provider]  loratadine (CLARITIN) 10 MG tablet Take 10 mg by mouth daily. Patient not taking: Reported on 03/29/2022    [provider]  magnesium oxide (MAG-OX) 400 (240 Mg) MG tablet Take 400 mg by mouth daily.    [provider]  mesalamine (LIALDA) 1.2 g EC tablet Take 1.2 g by mouth daily. 04/14/20   [provider]  Multiple Vitamins-Minerals (ZINC  PO) Take 50 mg by mouth daily.    [provider]  ondansetron (ZOFRAN-ODT) 4 MG disintegrating tablet Take 1 tablet (4 mg total) by mouth every 8 (eight) hours as needed for nausea or vomiting. Patient not taking: Reported on 03/29/2022 10/18/21   Lavonia Drafts, MD  oxyCODONE-acetaminophen (PERCOCET) 5-325 MG tablet Take 1-2 tablets by mouth every 8 (eight) hours as needed for severe pain. Patient not taking: Reported on 03/29/2022 10/18/21 10/18/22  Lavonia Drafts, MD  Potassium 99 MG TABS Take 99 mg by mouth daily.    [provider]  pyridoxine (B-6) 500 MG tablet Take 500 mg by mouth daily.    [provider]  Vitamin D, Ergocalciferol, (DRISDOL) 1.25 MG (50000 UNIT) CAPS capsule Take 1 capsule (50,000 Units total) by mouth every 7 (seven) days. Patient not taking: Reported on 03/29/2022 05/20/20   Mellody Dance, DO  Vitamin D-Vitamin K (VITAMIN K2-VITAMIN D3 PO) Take 1 tablet by mouth daily.    [provider]    Family History Family History  Problem Relation Age of Onset   Asthma Mother    Hyperlipidemia Mother    Depression Mother    Hypothyroidism Mother    Obesity Mother    Hyperthyroidism Father    Depression Father    Early death Father    Hypothyroidism Father    Bipolar disorder Father    Depression Sister    Asthma Maternal Grandmother    Dementia Maternal Grandmother    Hypertension Maternal Grandmother    Multiple myeloma Maternal Grandmother    Heart disease Maternal  Grandmother    Dementia Maternal Grandfather    Melanoma Maternal Grandfather    Heart attack Maternal Grandfather    Diabetes type II Maternal Grandfather    Diabetes Maternal Grandfather    Heart disease Maternal Grandfather    Breast cancer Paternal Grandmother    Hyperlipidemia Paternal Grandmother    Hypothyroidism Paternal Grandmother    Inflammatory bowel disease Paternal Grandmother    Colon cancer Cousin    Heart attack Paternal Uncle    Diabetes type II Paternal Aunt    Diabetes type II Cousin    Diabetes type II Paternal Uncle    Hyperlipidemia Paternal Grandfather    Hypertension Paternal Grandfather    Hypothyroidism Paternal Grandfather    Pancreatic cancer Paternal Grandfather    Cancer Paternal Grandfather    Hypertension Paternal Uncle    Hypertension Paternal Aunt    Kidney disease Other    Stroke Maternal Uncle    Celiac disease Sister        questionable   COPD Neg Hx     Social History Social History   Tobacco Use   Smoking status: Former    Types: Cigarettes    Quit date: 05/10/1993    Years since quitting: 28.9   Smokeless tobacco: Never  Vaping Use   Vaping Use: Never used  Substance Use Topics   Alcohol use: Not Currently   Drug use: No     Allergies   Lactose intolerance (gi) and Clindamycin/lincomycin   Review of Systems Review of Systems  Respiratory:  Positive for wheezing.      Physical Exam Triage Vital Signs ED Triage Vitals [04/21/22 0842]  Enc Vitals Group     BP 137/84     Pulse Rate 66     Resp 18     Temp 97.9 F (36.6 C)     Temp src      SpO2  98 %     Weight      Height      Head Circumference      Peak Flow      Pain Score 0     Pain Loc      Pain Edu?      Excl. in [place]?    No data found.  Updated Vital Signs BP 137/84   Pulse 66   Temp 97.9 F (36.6 C)   Resp 18   LMP 12/10/2021   SpO2 98%   Visual Acuity Right Eye Distance:   Left Eye Distance:   Bilateral Distance:    Right Eye Near:    Left Eye Near:    Bilateral Near:     Physical Exam Vitals reviewed.  Constitutional:      Appearance: Normal appearance.  HENT:     Right Ear: Tympanic membrane normal.     Left Ear: Tympanic membrane normal.     Mouth/Throat:     Pharynx: No oropharyngeal exudate or posterior oropharyngeal erythema.  Cardiovascular:     Rate and Rhythm: Normal rate and regular rhythm.     Pulses: Normal pulses.     Heart sounds: Normal heart sounds.  Pulmonary:     Effort: Pulmonary effort is normal.     Breath sounds: Normal breath sounds.  Skin:    General: Skin is warm and dry.  Neurological:     General: No focal deficit present.     Mental Status: She is alert and oriented to person, place, and time.  Psychiatric:        Mood and Affect: Mood normal.        Behavior: Behavior normal.      UC Treatments / Results  Labs (all labs ordered are listed, but only abnormal results are displayed) Labs Reviewed - No data to display  EKG   Radiology No results found.  Procedures Procedures (including critical care time)  Medications Ordered in UC Medications - No data to display  Initial Impression / Assessment and Plan / UC Course  I have reviewed the triage vital signs and the nursing notes.  Pertinent labs & imaging results that were available during my care of the patient were reviewed by me and considered in my medical decision making (see chart for details).   Patient is afebrile here without recent antipyretics. Satting well on room air. Overall is well appearing, well hydrated, without respiratory distress. Pulmonary exam is unremarkable.  Lungs CTAB without wheezing, rhonchi, rales.  Suspect viral, possibly passed, as the cause of her is not symptoms.  I see no impediment to moving forward with her planned surgical procedure.  Continue to recommend use of OTC medication for symptom control.  Final Clinical Impressions(s) / UC Diagnoses   Final diagnoses:  None    Discharge Instructions   None    ED Prescriptions   None    PDMP not reviewed this encounter.   Rose Phi, [place] 04/21/22 [phone]

## 2022-04-22 NOTE — Anesthesia Preprocedure Evaluation (Addendum)
Anesthesia Evaluation  Patient identified by MRN, date of birth, ID band Patient awake    Reviewed: Allergy & Precautions, NPO status , Patient's Chart, lab work & pertinent test results  Airway Mallampati: III  TM Distance: >3 FB Neck ROM: Full    Dental no notable dental hx.    Pulmonary asthma , sleep apnea , former smoker   Pulmonary exam normal        Cardiovascular negative cardio ROS Normal cardiovascular exam     Neuro/Psych  PSYCHIATRIC DISORDERS  Depression    negative neurological ROS     GI/Hepatic Neg liver ROS, PUD,GERD  Medicated and Controlled,,  Endo/Other  Hypothyroidism  Morbid obesity  Renal/GU negative Renal ROS     Musculoskeletal negative musculoskeletal ROS (+)    Abdominal  (+) + obese  Peds  Hematology negative hematology ROS (+)   Anesthesia Other Findings PELVIC ORGAN PROLASPE/STRESS URINARY INCONTINENCE  Reproductive/Obstetrics Hcg negative                             Anesthesia Physical Anesthesia Plan  ASA: 3  Anesthesia Plan: General   Post-op Pain Management:    Induction: Intravenous  PONV Risk Score and Plan: 4 or greater and Ondansetron, Dexamethasone, Midazolam, Scopolamine patch - Pre-op and Treatment may vary due to age or medical condition  Airway Management Planned: Oral ETT  Additional Equipment:   Intra-op Plan:   Post-operative Plan: Extubation in OR  Informed Consent: I have reviewed the patients History and Physical, chart, labs and discussed the procedure including the risks, benefits and alternatives for the proposed anesthesia with the patient or authorized representative who has indicated his/her understanding and acceptance.     Dental advisory given  Plan Discussed with: CRNA  Anesthesia Plan Comments:        Anesthesia Quick Evaluation

## 2022-04-23 ENCOUNTER — Encounter (HOSPITAL_COMMUNITY)
Admission: RE | Disposition: A | Discharge status: Home / Self Care | Payer: Self-pay | Source: Home / Self Care | Attending: Urology

## 2022-04-23 ENCOUNTER — Ambulatory Visit (HOSPITAL_COMMUNITY): Payer: Medicaid Other | Admitting: Physician Assistant

## 2022-04-23 ENCOUNTER — Other Ambulatory Visit: Payer: Self-pay

## 2022-04-23 ENCOUNTER — Observation Stay (HOSPITAL_COMMUNITY)
Admission: RE | Admit: 2022-04-23 | Discharge: 2022-04-24 | Disposition: A | Discharge status: Home / Self Care | Payer: Medicaid Other | Attending: Urology | Admitting: Urology

## 2022-04-23 ENCOUNTER — Ambulatory Visit (HOSPITAL_BASED_OUTPATIENT_CLINIC_OR_DEPARTMENT_OTHER): Payer: Medicaid Other | Admitting: Anesthesiology

## 2022-04-23 ENCOUNTER — Encounter (HOSPITAL_COMMUNITY): Payer: Self-pay | Admitting: Urology

## 2022-04-23 DIAGNOSIS — N816 Rectocele: Secondary | ICD-10-CM | POA: Diagnosis not present

## 2022-04-23 DIAGNOSIS — N819 Female genital prolapse, unspecified: Secondary | ICD-10-CM | POA: Diagnosis present

## 2022-04-23 DIAGNOSIS — Z79899 Other long term (current) drug therapy: Secondary | ICD-10-CM | POA: Insufficient documentation

## 2022-04-23 DIAGNOSIS — J45909 Unspecified asthma, uncomplicated: Secondary | ICD-10-CM

## 2022-04-23 DIAGNOSIS — N8111 Cystocele, midline: Secondary | ICD-10-CM | POA: Diagnosis not present

## 2022-04-23 DIAGNOSIS — N393 Stress incontinence (female) (male): Secondary | ICD-10-CM | POA: Insufficient documentation

## 2022-04-23 DIAGNOSIS — Z85850 Personal history of malignant neoplasm of thyroid: Secondary | ICD-10-CM | POA: Insufficient documentation

## 2022-04-23 DIAGNOSIS — N8189 Other female genital prolapse: Secondary | ICD-10-CM | POA: Diagnosis present

## 2022-04-23 DIAGNOSIS — Z87891 Personal history of nicotine dependence: Secondary | ICD-10-CM | POA: Insufficient documentation

## 2022-04-23 DIAGNOSIS — Z01818 Encounter for other preprocedural examination: Secondary | ICD-10-CM

## 2022-04-23 HISTORY — PX: PUBOVAGINAL SLING: SHX1035

## 2022-04-23 HISTORY — PX: ROBOTIC ASSISTED LAPAROSCOPIC SACROCOLPOPEXY: SHX5388

## 2022-04-23 HISTORY — PX: ROBOTIC ASSISTED LAPAROSCOPIC HYSTERECTOMY AND SALPINGECTOMY: SHX6379

## 2022-04-23 LAB — POCT PREGNANCY, URINE: Preg Test, Ur: NEGATIVE

## 2022-04-23 SURGERY — CREATION, PUBOVAGINAL SLING
Anesthesia: General | Site: Abdomen

## 2022-04-23 MED ORDER — SODIUM CHLORIDE 0.9% FLUSH: 3.0000 mL | INTRAVENOUS | Status: DC | PRN

## 2022-04-23 MED ORDER — ESTRADIOL 0.1 MG/GM VA CREA: 1.0000 | TOPICAL_CREAM | VAGINAL | 1 refills | Status: DC

## 2022-04-23 MED ORDER — ROCURONIUM BROMIDE 10 MG/ML (PF) SYRINGE
PREFILLED_SYRINGE | INTRAVENOUS | Status: DC | PRN
  Administered 2022-04-23 (×3): 20 mg via INTRAVENOUS
  Administered 2022-04-23: 10 mg via INTRAVENOUS
  Administered 2022-04-23: 60 mg via INTRAVENOUS
  Administered 2022-04-23: 20 mg via INTRAVENOUS

## 2022-04-23 MED ORDER — FENTANYL CITRATE PF 50 MCG/ML IJ SOSY: 25.0000 ug | PREFILLED_SYRINGE | INTRAMUSCULAR | Status: DC | PRN

## 2022-04-23 MED ORDER — HYDROMORPHONE HCL 2 MG/ML IJ SOLN
INTRAMUSCULAR | Status: AC
  Filled 2022-04-23: qty 1

## 2022-04-23 MED ORDER — BUPIVACAINE LIPOSOME 1.3 % IJ SUSP
INTRAMUSCULAR | Status: AC
  Filled 2022-04-23: qty 20

## 2022-04-23 MED ORDER — ROCURONIUM BROMIDE 10 MG/ML (PF) SYRINGE
PREFILLED_SYRINGE | INTRAVENOUS | Status: AC
  Filled 2022-04-23: qty 10

## 2022-04-23 MED ORDER — LIDOCAINE HCL (CARDIAC) PF 100 MG/5ML IV SOSY
PREFILLED_SYRINGE | INTRAVENOUS | Status: DC | PRN
  Administered 2022-04-23: 60 mg via INTRAVENOUS

## 2022-04-23 MED ORDER — SENNA 8.6 MG PO TABS
1.0000 | ORAL_TABLET | Freq: Two times a day (BID) | ORAL | Status: DC
  Administered 2022-04-23 – 2022-04-24 (×2): 8.6 mg via ORAL
  Filled 2022-04-23 (×2): qty 1

## 2022-04-23 MED ORDER — FENTANYL CITRATE (PF) 250 MCG/5ML IJ SOLN
INTRAMUSCULAR | Status: DC | PRN
  Administered 2022-04-23 (×2): 100 ug via INTRAVENOUS
  Administered 2022-04-23: 50 ug via INTRAVENOUS

## 2022-04-23 MED ORDER — BUPIVACAINE-EPINEPHRINE 0.5% -1:200000 IJ SOLN
INTRAMUSCULAR | Status: DC | PRN
  Administered 2022-04-23: 30 mL

## 2022-04-23 MED ORDER — ONDANSETRON HCL 4 MG/2ML IJ SOLN
INTRAMUSCULAR | Status: AC
  Filled 2022-04-23: qty 2

## 2022-04-23 MED ORDER — BUPIVACAINE-EPINEPHRINE (PF) 0.5% -1:200000 IJ SOLN
INTRAMUSCULAR | Status: AC
  Filled 2022-04-23: qty 30

## 2022-04-23 MED ORDER — LEVOTHYROXINE SODIUM 100 MCG PO TABS
200.0000 ug | ORAL_TABLET | Freq: Every day | ORAL | Status: DC
  Administered 2022-04-24: 200 ug via ORAL
  Filled 2022-04-23: qty 2

## 2022-04-23 MED ORDER — ONDANSETRON HCL 4 MG/2ML IJ SOLN: 4.0000 mg | INTRAMUSCULAR | Status: DC | PRN

## 2022-04-23 MED ORDER — MIDAZOLAM HCL 2 MG/2ML IJ SOLN
INTRAMUSCULAR | Status: DC | PRN
  Administered 2022-04-23: 2 mg via INTRAVENOUS

## 2022-04-23 MED ORDER — SODIUM CHLORIDE 0.9 % IV SOLN: 250.0000 mL | INTRAVENOUS | Status: DC | PRN

## 2022-04-23 MED ORDER — CHLORHEXIDINE GLUCONATE 0.12 % MT SOLN
15.0000 mL | Freq: Once | OROMUCOSAL | Status: AC
  Administered 2022-04-23: 15 mL via OROMUCOSAL

## 2022-04-23 MED ORDER — AMISULPRIDE (ANTIEMETIC) 5 MG/2ML IV SOLN: 10.0000 mg | Freq: Once | INTRAVENOUS | Status: DC | PRN

## 2022-04-23 MED ORDER — FENTANYL CITRATE (PF) 250 MCG/5ML IJ SOLN
INTRAMUSCULAR | Status: AC
  Filled 2022-04-23: qty 5

## 2022-04-23 MED ORDER — DEXAMETHASONE SODIUM PHOSPHATE 10 MG/ML IJ SOLN
INTRAMUSCULAR | Status: DC | PRN
  Administered 2022-04-23: 8 mg via INTRAVENOUS

## 2022-04-23 MED ORDER — SODIUM CHLORIDE (PF) 0.9 % IJ SOLN
INTRAMUSCULAR | Status: AC
  Filled 2022-04-23: qty 20

## 2022-04-23 MED ORDER — ACETAMINOPHEN 500 MG PO TABS
1000.0000 mg | ORAL_TABLET | Freq: Four times a day (QID) | ORAL | Status: AC
  Administered 2022-04-23 – 2022-04-24 (×4): 1000 mg via ORAL
  Filled 2022-04-23 (×4): qty 2

## 2022-04-23 MED ORDER — SULFAMETHOXAZOLE-TRIMETHOPRIM 800-160 MG PO TABS: 1.0000 | ORAL_TABLET | Freq: Two times a day (BID) | ORAL | 0 refills | Status: DC

## 2022-04-23 MED ORDER — SCOPOLAMINE 1 MG/3DAYS TD PT72
1.0000 | MEDICATED_PATCH | TRANSDERMAL | Status: DC
  Administered 2022-04-23: 1.5 mg via TRANSDERMAL
  Filled 2022-04-23: qty 1

## 2022-04-23 MED ORDER — GLYCOPYRROLATE 0.2 MG/ML IJ SOLN
INTRAMUSCULAR | Status: DC | PRN
  Administered 2022-04-23: .2 mg via INTRAVENOUS

## 2022-04-23 MED ORDER — ATROPINE SULFATE 1 MG/ML IV SOLN
INTRAVENOUS | Status: DC | PRN
  Administered 2022-04-23: .5 mg via INTRAVENOUS

## 2022-04-23 MED ORDER — POLYETHYLENE GLYCOL 3350 17 GM/SCOOP PO POWD: 1.0000 | Freq: Once | ORAL | Status: DC

## 2022-04-23 MED ORDER — OXYCODONE HCL 5 MG/5ML PO SOLN: 5.0000 mg | Freq: Once | ORAL | Status: DC | PRN

## 2022-04-23 MED ORDER — TRAMADOL HCL 50 MG PO TABS: 50.0000 mg | ORAL_TABLET | Freq: Four times a day (QID) | ORAL | 0 refills | Status: DC | PRN

## 2022-04-23 MED ORDER — ORAL CARE MOUTH RINSE: 15.0000 mL | Freq: Once | OROMUCOSAL | Status: AC

## 2022-04-23 MED ORDER — OXYCODONE HCL 5 MG PO TABS: 5.0000 mg | ORAL_TABLET | Freq: Once | ORAL | Status: DC | PRN

## 2022-04-23 MED ORDER — DOCUSATE SODIUM 100 MG PO CAPS
100.0000 mg | ORAL_CAPSULE | Freq: Two times a day (BID) | ORAL | Status: DC
  Administered 2022-04-23 – 2022-04-24 (×2): 100 mg via ORAL
  Filled 2022-04-23 (×2): qty 1

## 2022-04-23 MED ORDER — EPHEDRINE SULFATE-NACL 50-0.9 MG/10ML-% IV SOSY
PREFILLED_SYRINGE | INTRAVENOUS | Status: DC | PRN
  Administered 2022-04-23: 5 mg via INTRAVENOUS

## 2022-04-23 MED ORDER — OXYCODONE HCL 5 MG PO TABS
5.0000 mg | ORAL_TABLET | ORAL | Status: DC | PRN
  Administered 2022-04-23 – 2022-04-24 (×3): 5 mg via ORAL
  Filled 2022-04-23 (×3): qty 1

## 2022-04-23 MED ORDER — ATROPINE SULFATE 1 MG/10ML IJ SOSY
PREFILLED_SYRINGE | INTRAMUSCULAR | Status: AC
  Filled 2022-04-23: qty 10

## 2022-04-23 MED ORDER — ACETAMINOPHEN 500 MG PO TABS
1000.0000 mg | ORAL_TABLET | Freq: Once | ORAL | Status: AC
  Administered 2022-04-23: 1000 mg via ORAL
  Filled 2022-04-23: qty 2

## 2022-04-23 MED ORDER — STERILE WATER FOR IRRIGATION IR SOLN
Status: DC | PRN
  Administered 2022-04-23: 1000 mL

## 2022-04-23 MED ORDER — ONDANSETRON HCL 4 MG/2ML IJ SOLN
INTRAMUSCULAR | Status: DC | PRN
  Administered 2022-04-23: 4 mg via INTRAVENOUS

## 2022-04-23 MED ORDER — MORPHINE SULFATE (PF) 2 MG/ML IV SOLN: 2.0000 mg | INTRAVENOUS | Status: DC | PRN

## 2022-04-23 MED ORDER — GABAPENTIN 300 MG PO CAPS: 300.0000 mg | ORAL_CAPSULE | Freq: Every day | ORAL | 0 refills | Status: DC

## 2022-04-23 MED ORDER — LACTATED RINGERS IV SOLN: INTRAVENOUS | Status: DC

## 2022-04-23 MED ORDER — LIDOCAINE HCL (PF) 1 % IJ SOLN
INTRAMUSCULAR | Status: AC
  Filled 2022-04-23: qty 30

## 2022-04-23 MED ORDER — PROPOFOL 10 MG/ML IV BOLUS
INTRAVENOUS | Status: DC | PRN
  Administered 2022-04-23: 200 mg via INTRAVENOUS

## 2022-04-23 MED ORDER — SUGAMMADEX SODIUM 200 MG/2ML IV SOLN
INTRAVENOUS | Status: DC | PRN
  Administered 2022-04-23: 200 mg via INTRAVENOUS

## 2022-04-23 MED ORDER — SODIUM CHLORIDE 0.9 % IV SOLN: INTRAVENOUS | Status: DC

## 2022-04-23 MED ORDER — HYDROMORPHONE HCL 1 MG/ML IJ SOLN
INTRAMUSCULAR | Status: DC | PRN
  Administered 2022-04-23 (×2): .5 mg via INTRAVENOUS

## 2022-04-23 MED ORDER — ESTRADIOL 0.1 MG/GM VA CREA
TOPICAL_CREAM | VAGINAL | Status: AC
  Filled 2022-04-23: qty 42.5

## 2022-04-23 MED ORDER — DEXAMETHASONE SODIUM PHOSPHATE 10 MG/ML IJ SOLN
INTRAMUSCULAR | Status: AC
  Filled 2022-04-23: qty 1

## 2022-04-23 MED ORDER — LACTATED RINGERS IR SOLN
Status: DC | PRN
  Administered 2022-04-23: 1

## 2022-04-23 MED ORDER — SODIUM CHLORIDE (PF) 0.9 % IJ SOLN
INTRAMUSCULAR | Status: AC
  Filled 2022-04-23: qty 10

## 2022-04-23 MED ORDER — LIDOCAINE HCL (PF) 2 % IJ SOLN
INTRAMUSCULAR | Status: AC
  Filled 2022-04-23: qty 5

## 2022-04-23 MED ORDER — MIDAZOLAM HCL 2 MG/2ML IJ SOLN
INTRAMUSCULAR | Status: AC
  Filled 2022-04-23: qty 2

## 2022-04-23 MED ORDER — PHENYLEPHRINE 80 MCG/ML (10ML) SYRINGE FOR IV PUSH (FOR BLOOD PRESSURE SUPPORT)
PREFILLED_SYRINGE | INTRAVENOUS | Status: DC | PRN
  Administered 2022-04-23 (×3): 160 ug via INTRAVENOUS

## 2022-04-23 MED ORDER — ESTRADIOL 0.1 MG/GM VA CREA
TOPICAL_CREAM | VAGINAL | Status: DC | PRN
  Administered 2022-04-23: 1 via VAGINAL

## 2022-04-23 MED ORDER — KETOROLAC TROMETHAMINE 30 MG/ML IJ SOLN: 30.0000 mg | Freq: Once | INTRAMUSCULAR | Status: DC | PRN

## 2022-04-23 MED ORDER — GABAPENTIN 100 MG PO CAPS
100.0000 mg | ORAL_CAPSULE | Freq: Every day | ORAL | Status: DC
  Administered 2022-04-23: 100 mg via ORAL
  Filled 2022-04-23: qty 1

## 2022-04-23 MED ORDER — FAMOTIDINE 20 MG PO TABS
20.0000 mg | ORAL_TABLET | Freq: Every day | ORAL | Status: DC
  Administered 2022-04-23: 20 mg via ORAL
  Filled 2022-04-23: qty 1

## 2022-04-23 MED ORDER — CEFAZOLIN SODIUM-DEXTROSE 2-4 GM/100ML-% IV SOLN
2.0000 g | INTRAVENOUS | Status: AC
  Administered 2022-04-23 (×2): 2 g via INTRAVENOUS
  Filled 2022-04-23: qty 100

## 2022-04-23 MED ORDER — GLYCOPYRROLATE 0.2 MG/ML IJ SOLN
INTRAMUSCULAR | Status: AC
  Filled 2022-04-23: qty 1

## 2022-04-23 MED ORDER — BUPIVACAINE LIPOSOME 1.3 % IJ SUSP
INTRAMUSCULAR | Status: DC | PRN
  Administered 2022-04-23: 20 mL

## 2022-04-23 MED ORDER — PROPOFOL 10 MG/ML IV BOLUS
INTRAVENOUS | Status: AC
  Filled 2022-04-23: qty 20

## 2022-04-23 MED ORDER — SODIUM CHLORIDE 0.9% FLUSH: 3.0000 mL | Freq: Two times a day (BID) | INTRAVENOUS | Status: DC

## 2022-04-23 MED ORDER — SODIUM CHLORIDE 0.9 % IR SOLN
Status: DC | PRN
  Administered 2022-04-23: 1000 mL via INTRAVESICAL

## 2022-04-23 MED ORDER — PROMETHAZINE HCL 25 MG/ML IJ SOLN: 6.2500 mg | INTRAMUSCULAR | Status: DC | PRN

## 2022-04-23 SURGICAL SUPPLY — 88 items
BAG COUNTER SPONGE SURGICOUNT (BAG) IMPLANT
BAG URINE DRAIN 2000ML AR STRL (UROLOGICAL SUPPLIES) IMPLANT
BLADE HEX COATED 2.75 (ELECTRODE) ×1 IMPLANT
BLADE SURG 15 STRL LF DISP TIS (BLADE) ×2 IMPLANT
BLADE SURG 15 STRL SS (BLADE) ×2
BRIEF MESH DISP LRG (UNDERPADS AND DIAPERS) IMPLANT
CATH FOLEY 2WAY SLVR  5CC 16FR (CATHETERS) ×1
CATH FOLEY 2WAY SLVR 5CC 16FR (CATHETERS) ×1 IMPLANT
CHLORAPREP W/TINT 26 (MISCELLANEOUS) ×1 IMPLANT
CLIP LIGATING HEM O LOK PURPLE (MISCELLANEOUS) ×1 IMPLANT
CLIP LIGATING HEMO LOK XL GOLD (MISCELLANEOUS) IMPLANT
CLIP LIGATING HEMO O LOK GREEN (MISCELLANEOUS) IMPLANT
COVER BACK TABLE 60X90IN (DRAPES) ×1 IMPLANT
COVER MAYO STAND STRL (DRAPES) IMPLANT
COVER SURGICAL LIGHT HANDLE (MISCELLANEOUS) ×1 IMPLANT
COVER TIP SHEARS 8 DVNC (MISCELLANEOUS) ×1 IMPLANT
COVER TIP SHEARS 8MM DA VINCI (MISCELLANEOUS) ×1
DERMABOND ADVANCED .7 DNX12 (GAUZE/BANDAGES/DRESSINGS) ×1 IMPLANT
DRAIN CHANNEL RND F F (WOUND CARE) IMPLANT
DRAPE ARM DVNC X/XI (DISPOSABLE) ×4 IMPLANT
DRAPE COLUMN DVNC XI (DISPOSABLE) ×1 IMPLANT
DRAPE DA VINCI XI ARM (DISPOSABLE) ×4
DRAPE DA VINCI XI COLUMN (DISPOSABLE) ×1
DRAPE INCISE IOBAN 66X45 STRL (DRAPES) ×1 IMPLANT
DRAPE SHEET LG 3/4 BI-LAMINATE (DRAPES) ×2 IMPLANT
DRAPE SURG IRRIG POUCH 19X23 (DRAPES) ×1 IMPLANT
ELECT PENCIL ROCKER SW 15FT (MISCELLANEOUS) ×1 IMPLANT
ELECT REM PT RETURN 15FT ADLT (MISCELLANEOUS) ×1 IMPLANT
GAUZE 4X4 16PLY ~~LOC~~+RFID DBL (SPONGE) ×2 IMPLANT
GAUZE STRIP PACKING 2INX5YD (MISCELLANEOUS) ×1 IMPLANT
GLOVE BIO SURGEON STRL SZ 6.5 (GLOVE) ×1 IMPLANT
GLOVE BIOGEL PI IND STRL 8 (GLOVE) ×1 IMPLANT
GLOVE SURG LX STRL 7.5 STRW (GLOVE) ×3 IMPLANT
GOWN SRG XL LVL 4 BRTHBL STRL (GOWNS) ×1 IMPLANT
GOWN STRL NON-REIN XL LVL4 (GOWNS) ×1
GOWN STRL REUS W/ TWL XL LVL3 (GOWN DISPOSABLE) ×2 IMPLANT
GOWN STRL REUS W/TWL XL LVL3 (GOWN DISPOSABLE) ×2
HOLDER FOLEY CATH W/STRAP (MISCELLANEOUS) ×1 IMPLANT
IRRIG SUCT STRYKERFLOW 2 WTIP (MISCELLANEOUS) ×1
IRRIGATION SUCT STRKRFLW 2 WTP (MISCELLANEOUS) ×1 IMPLANT
KIT BASIN OR (CUSTOM PROCEDURE TRAY) ×1 IMPLANT
KIT TURNOVER KIT A (KITS) IMPLANT
MANIPULATOR UTERINE 4.5 ZUMI (MISCELLANEOUS) IMPLANT
MARKER SKIN DUAL TIP RULER LAB (MISCELLANEOUS) ×1 IMPLANT
MESH Y UPSYLON VAGINAL (Mesh General) IMPLANT
NEEDLE HYPO 22GX1.5 SAFETY (NEEDLE) IMPLANT
NS IRRIG 1000ML POUR BTL (IV SOLUTION) ×1 IMPLANT
OCCLUDER COLPOPNEUMO (BALLOONS) IMPLANT
PACK CYSTO (CUSTOM PROCEDURE TRAY) ×1 IMPLANT
PACKING VAGINAL (PACKING) IMPLANT
PAD OB MATERNITY 4.3X12.25 (PERSONAL CARE ITEMS) IMPLANT
PAD POSITIONING PINK XL (MISCELLANEOUS) ×1 IMPLANT
PLUG CATH AND CAP STER (CATHETERS) ×1 IMPLANT
RETRACTOR LONRSTAR 16.6X16.6CM (MISCELLANEOUS) ×1 IMPLANT
RETRACTOR STAY HOOK 5MM (MISCELLANEOUS) ×1 IMPLANT
RETRACTOR STER APS 16.6X16.6CM (MISCELLANEOUS) ×1
SEAL CANN UNIV 5-8 DVNC XI (MISCELLANEOUS) ×4 IMPLANT
SEAL XI 5MM-8MM UNIVERSAL (MISCELLANEOUS) ×4
SET IRRIG Y TYPE TUR BLADDER L (SET/KITS/TRAYS/PACK) IMPLANT
SET TUBE SMOKE EVAC HIGH FLOW (TUBING) ×1 IMPLANT
SHEET LAVH (DRAPES) ×1 IMPLANT
SLING LYNX SUPRAPUBIC (Sling) ×1 IMPLANT
SOL PREP POV-IOD 4OZ 10% (MISCELLANEOUS) IMPLANT
SOLUTION ELECTROLUBE (MISCELLANEOUS) ×1 IMPLANT
SPIKE FLUID TRANSFER (MISCELLANEOUS) ×1 IMPLANT
SURGILUBE 2OZ TUBE FLIPTOP (MISCELLANEOUS) IMPLANT
SUT MNCRL AB 4-0 PS2 18 (SUTURE) ×2 IMPLANT
SUT PROLENE 2 0 CT 1 (SUTURE) ×1 IMPLANT
SUT VIC AB 0 CT1 27 (SUTURE) ×1
SUT VIC AB 0 CT1 27XBRD ANTBC (SUTURE) ×1 IMPLANT
SUT VIC AB 2-0 SH 27 (SUTURE) ×6
SUT VIC AB 2-0 SH 27XBRD (SUTURE) ×5 IMPLANT
SUT VIC AB 2-0 UR6 27 (SUTURE) ×1 IMPLANT
SUT VIC AB 3-0 SH 27 (SUTURE) ×1
SUT VIC AB 3-0 SH 27X BRD (SUTURE) IMPLANT
SUT VICRYL 0 UR6 27IN ABS (SUTURE) ×1 IMPLANT
SUT VLOC 180 3-0 9IN GS21 (SUTURE) IMPLANT
SYR 10ML LL (SYRINGE) ×1 IMPLANT
SYR 50ML LL SCALE MARK (SYRINGE) IMPLANT
SYS BAG RETRIEVAL 10MM (BASKET)
SYSTEM BAG RETRIEVAL 10MM (BASKET) IMPLANT
TOWEL OR 17X26 10 PK STRL BLUE (TOWEL DISPOSABLE) ×1 IMPLANT
TRAY LAPAROSCOPIC (CUSTOM PROCEDURE TRAY) ×1 IMPLANT
TROCAR Z THREAD OPTICAL 12X100 (TROCAR) ×1 IMPLANT
TROCAR Z-THREAD FIOS 5X100MM (TROCAR) ×1 IMPLANT
TUBING CONNECTING 10 (TUBING) ×1 IMPLANT
WATER STERILE IRR 1000ML POUR (IV SOLUTION) ×1 IMPLANT
WATER STERILE IRR 500ML POUR (IV SOLUTION) ×1 IMPLANT

## 2022-04-23 NOTE — Transfer of Care (Signed)
Immediate Anesthesia Transfer of Care Note  Patient: Linda Ware  Procedure(s) Performed: MID URETHRAL SLING, CYSTOSCOPY (Abdomen) XI ROBOTIC ASSISTED LAPAROSCOPIC SUPRACERVICAL HYSTERECTOMY AND BILATERAL SALPINGECTOMY (Abdomen) XI ROBOTIC ASSISTED LAPAROSCOPIC SACROCOLPOPEXY (Abdomen)  Patient Location: PACU  Anesthesia Type:General  Level of Consciousness: drowsy  Airway & Oxygen Therapy: Patient Spontanous Breathing and Patient connected to face mask oxygen  Post-op Assessment: Report given to RN and Post -op Vital signs reviewed and stable  Post vital signs: Reviewed and stable  Last Vitals:  Vitals Value Taken Time  BP 129/82 04/23/22 1345  Temp 36.4 C 04/23/22 1345  Pulse 49 04/23/22 1348  Resp 12 04/23/22 1348  SpO2 100 % 04/23/22 1348  Vitals shown include unvalidated device data.  Last Pain:  Vitals:   04/23/22 0657  TempSrc:   PainSc: 0-No pain         Complications: No notable events documented.

## 2022-04-23 NOTE — Discharge Instructions (Signed)
  Driving:  It is against the law to drive when taking narcotic pain medications.  You should wait at least 8 hours after taking your last pain pill before driving.  Further, you should not drive if you are to sore to react quickly or if you have something impeding your ability to drive.   Activity:  You are encouraged to ambulate frequently (about every hour during waking hours) to help prevent blood clots from forming in your legs or lungs.  However, you should not engage in any heavy lifting (> 10-15 lbs), strenuous activity, or straining.  Diet: You should advance your diet as instructed by your physician.  It will be normal to have some bloating, nausea, and abdominal discomfort intermittently.  Prescriptions:  You will be provided a prescription for pain medication to take as needed.  If your pain is not severe enough to require the prescription pain medication, you may take extra strength Tylenol instead which will have less side effects.  You should also take a prescribed stool softener to avoid straining with bowel movements as the prescription pain medication may constipate you.  Incisions: You may remove your dressing bandages 48 hours after surgery if not removed in the hospital.  You will either have some small staples or special tissue glue at each of the incision sites. Once the bandages are removed (if present), the incisions may stay open to air.  You may start showering (but not soaking or bathing in water) the 2nd day after surgery and the incisions simply need to be patted dry after the shower.  No additional care is needed.  What to call us about: You should call the office [phone]) if you develop fever > 101 or develop persistent vomiting. Activity:  You are encouraged to ambulate frequently (about every hour during waking hours) to help prevent blood clots from forming in your legs or lungs.  However, you should not engage in any heavy lifting (> 10-15 lbs), strenuous activity,  or straining.

## 2022-04-23 NOTE — Anesthesia Postprocedure Evaluation (Signed)
Anesthesia Post Note  Patient: Linda Ware  Procedure(s) Performed: MID URETHRAL SLING, CYSTOSCOPY (Abdomen) XI ROBOTIC ASSISTED LAPAROSCOPIC SUPRACERVICAL HYSTERECTOMY AND BILATERAL SALPINGECTOMY (Abdomen) XI ROBOTIC ASSISTED LAPAROSCOPIC SACROCOLPOPEXY (Abdomen)     Patient location during evaluation: PACU Anesthesia Type: General Level of consciousness: awake Pain management: pain level controlled Vital Signs Assessment: post-procedure vital signs reviewed and stable Respiratory status: spontaneous breathing, nonlabored ventilation and respiratory function stable Cardiovascular status: blood pressure returned to baseline and stable Postop Assessment: no apparent nausea or vomiting Anesthetic complications: no   No notable events documented.  Last Vitals:  Vitals:   04/23/22 1515 04/23/22 1550  BP: 117/85 136/69  Pulse: (!) 50   Resp: 16   Temp: (!) 36.4 C (!) 36.3 C  SpO2: 90% 98%    Last Pain:  Vitals:   04/23/22 1550  TempSrc: Oral  PainSc:                  [name] P [name]

## 2022-04-23 NOTE — Interval H&P Note (Signed)
History and Physical Interval Note:  04/23/2022 7:23 AM  Linda Ware  has presented today for surgery, with the diagnosis of PELVIC ORGAN PROLASPE/STRESS URINARY INCONTINENCE.  The various methods of treatment have been discussed with the patient and family. After consideration of risks, benefits and other options for treatment, the patient has consented to  Procedure(s) with comments: MID URETHRAL SLING, CYSTOSCOPY (N/A) - 270 MINUTES NEEDED FOR CASE XI ROBOTIC ASSISTED LAPAROSCOPIC SUPRACERVICAL HYSTERECTOMY AND BILATERAL SALPINGECTOMY (N/A) XI ROBOTIC ASSISTED LAPAROSCOPIC SACROCOLPOPEXY (N/A) as a surgical intervention.  The patient's history has been reviewed, patient examined, no change in status, stable for surgery.  I have reviewed the patient's chart and labs.  Questions were answered to the patient's satisfaction.     Ardis Hughs

## 2022-04-23 NOTE — H&P (Signed)
01/21/2022: 51 year old female self-referred for discussion of urinary incontinence.   The patient has had 5 children vaginally. Her last child is 55 years old. Since her last child was born she has had significant issues with prolapse. This been managed with a pessary. She no longer is wearing a pessary. She does feel a palpable bulge with in the vaginal introitus. She also has significant urinary incontinence. She has trouble with leakage with laughing coughing and sneezing. She denies any difficulty with urinary urgency or urge associate incontinence. She feels that she has a strong stream and empties her bladder well. She denies any history of recurrent urinary tract infections. She is not having any significant bladder pain. She is still sexually active. She is worried about the impact of a prolapse repair under sexual function.   The patient has a history of Crohn's disease as well as obesity. She is no history of intra-abdominal surgery.   03/16/2022: Patient here today for Urodynamics follow up. Denies any progression of her voiding symptoms. Denies any hematuria or dysuria.   UDS: 01/26/2022: Patient appears to be emptying her bladder well. She has a normal bladder capacity with mild stress incontinence when her bladder was full. She had excellent detrusor function with a strong stream and mildly hyperactive detrusor.       ALLERGIES: Clindamycin - Skin Rash    MEDICATIONS: Levothyroxine Sodium 200 mcg tablet  Famotidine 20 mg tablet  Gabapentin 100 mg capsule     GU PSH: Complex cystometrogram, w/ void pressure and urethral pressure profile studies, any technique - 01/26/2022 Complex Uroflow - 01/26/2022 Emg surf Electrd - 01/26/2022 Inject For cystogram - 01/26/2022 Intrabd voidng Press - 01/26/2022       PSH Notes: Umbilical hernia (3976)   NON-GU PSH: Thyroidectomy, 2007 Tonsillectomy.., 2008     GU PMH: Stress Incontinence - 01/26/2022, - 01/21/2022 Cystocele, midline -  01/21/2022      PMH Notes: H/O thyroid cancer, colitis    NON-GU PMH: Asthma GERD Sleep Apnea    FAMILY HISTORY: 2 daughters - Daughter 3 Son's - Son Deceased - Father   SOCIAL HISTORY: Marital Status: Married Preferred Language: English; Ethnicity: Not Hispanic Or Latino; Race: White Current Smoking Status: Patient does not smoke anymore. Has not smoked since 01/09/1992. Smoked for 4 years. Smoked 1 pack per day.   Tobacco Use Assessment Completed: Used Tobacco in last 30 days? Has never drank.  Drinks 1 caffeinated drink per day. 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    REVIEW OF SYSTEMS:    GU Review Female:   Patient denies frequent urination, hard to postpone urination, burning /pain with urination, get up at night to urinate, leakage of urine, stream starts and stops, trouble starting your stream, have to strain to urinate, and being pregnant.  Gastrointestinal (Upper):   Patient denies nausea, vomiting, and indigestion/ heartburn.  Gastrointestinal (Lower):   Patient denies diarrhea and constipation.  Constitutional:   Patient denies fever, night sweats, weight loss, and fatigue.  Skin:   Patient denies skin rash/ lesion and itching.  Eyes:   Patient denies blurred vision and double vision.  Ears/ Nose/ Throat:   Patient denies sore throat and sinus problems.  Hematologic/Lymphatic:   Patient denies easy bruising and swollen glands.  Cardiovascular:   Patient denies leg swelling and chest pains.  Respiratory:   Patient denies cough and shortness of breath.  Endocrine:   Patient denies excessive thirst.  Musculoskeletal:   Patient denies back pain and joint pain.  Neurological:   Patient denies headaches and dizziness.  Psychologic:   Patient denies depression and anxiety.   VITAL SIGNS: None   GU PHYSICAL EXAMINATION:    External Genitalia: No hirsuitism, no rash, no scarring, no cyst, no erythematous lesion, no papular lesion, no blanched lesion, no warty lesion, no  labial adhesions, no atrophic introitus. No edema.   Urethral Meatus: Normal size. Normal position. No discharge.  Urethra: Mild urethral hypermobility. No tenderness, no mass, no scarring. No leakage.   Bladder: Bladder soft. No tenderness, no mass, normal size.   Vagina: Large rectocele. Moderate cystocele. No atrophy, no stenosis. No enterocele.    MULTI-SYSTEM PHYSICAL EXAMINATION:       Complexity of Data:  Source Of History:  Patient  Records Review:   Previous Doctor Records, Previous Patient Records  Urine Test Review:   Urinalysis  Urodynamics Review:   Review Bladder Scan, Review Urodynamics Tests   PROCEDURES: None   ASSESSMENT:      ICD-10 Details  1 GU:   Cystocele, midline - N81.11   2   Stress Incontinence - N39.3   3   Rectocele - N81.6    PLAN:           Document Letter(s):  Created for Patient: Clinical Summary         Next Appointment:      Next Appointment: 03/30/2022 03:30 PM    Appointment Type: Pre OP Appontment    Location: [facility] [id number]    Provider: Mcarthur Rossetti, PA    Reason for Visit: PRE-OP FOR DR. Taylor 04/23/22       APPENDED NOTES:    I went over robotic-assisted laparoscopic sacral colpopexy with the patient detail. I explained to the patient the rationale for the surgery. I also went over the placement of the laparoscopic ports. I detailed to her the surgery as well as the postoperative recovery time. I explained to the patient that she could expect to be in the hospital at least one or 2 nights. She will require 4 weeks of no heavy lifting, 6 weeks of no bending or twisting. She will not be able to use her vagina for 6 weeks. I discussed complications of the operation including injury to bowel, ureters, bladder. We also discussed the risk of failure as well as the complications of mesh. I explained to them the difference between transvaginal mesh and the mesh used for sacral colpopexy. I  reassured them that there has not been an FDA warnings in regards to sacral colpopexy mesh. We will plan to get this prior to her surgery. I spent 45 minutes with the patient going over that ins and outs of the surgery and answering all her questions.   In addition, we will plan to perform a mid urethral sling for her stress incontinence. We discussed the surgery as well including the risk and associated benefits.   Our plan is to perform a ovary sparing hysterectomy per the patient's request.

## 2022-04-23 NOTE — Anesthesia Procedure Notes (Signed)
Procedure Name: Intubation Date/Time: 04/23/2022 7:43 AM  Performed by: Raenette Rover, CRNAPre-anesthesia Checklist: Patient identified, Emergency Drugs available, Suction available and Patient being monitored Patient Re-evaluated:Patient Re-evaluated prior to induction Oxygen Delivery Method: Circle system utilized Preoxygenation: Pre-oxygenation with 100% oxygen Induction Type: IV induction Ventilation: Mask ventilation without difficulty Laryngoscope Size: Mac and 3 Grade View: Grade I Tube type: Oral Tube size: 7.0 mm Number of attempts: 1 Airway Equipment and Method: Stylet Placement Confirmation: ETT inserted through vocal cords under direct vision, positive ETCO2 and breath sounds checked- equal and bilateral Secured at: 21 cm Tube secured with: Tape Dental Injury: Teeth and Oropharynx as per pre-operative assessment

## 2022-04-23 NOTE — Op Note (Signed)
Preoperative diagnosis:   Pelvic organ prolapse Stress incontinence  Postoperative diagnosis:   Same  Procedure: Robotic-assisted laparoscopic supracervical hysterectomy and  bilateral salpingectomy - ovarian sparing Robotic-assisted laparoscopic sacrocolpopexy Cystoscopy Mid-urethral sling  Surgeon: Ardis Hughs, MD First assistant: Margarita Sermons, MD  Anesthesia: General  Complications: None  Intraoperative findings:  [place] Y mesh used for the sacrocolpopexy.  EBL: 200 mL  Specimens: Uterus and proximal cervix, bilateral fallopian tubes and ovaries  Indication: Linda Ware is a 51 y.o. female patient with symptomatic pelvic organ prolapse.    After reviewing the management options for treatment, she elected to proceed with the above surgical procedure(s). We have discussed the potential benefits and risks of the procedure, side effects of the proposed treatment, the likelihood of the patient achieving the goals of the procedure, and any potential problems that might occur during the procedure or recuperation. Informed consent has been obtained.  Description of procedure:  The patient was taken to the operating room and general anesthesia was induced.  The patient was placed in the dorsal lithotomy position, prepped and draped in the usual sterile fashion, and preoperative antibiotics were administered. A preoperative time-out was performed.    A Foley catheter was then placed and placed to gravity drainage. I then made a periumbilical incision carrying the dissection down to the patient's fascia with electrocautery.  Once to the fascia, the fascia was incised and a small puncture hole made in the peritoneum to allow passage of a 29m port.   The abdomen was insufflated and the remaining ports placed under digital guidance.  2 ports were placed lateral to the umbilicus on the right proximally 10 cm apart.  The most lateral port was approximately 3 cm  above the anterior iliac spine.  2 additional ports were placed in the patient's right side in comparable positions to the most lateral port on the right was a 12 mm port.the robot was then docked at an angle from the leg obliquely along the side of the left leg.  We then began our surgery by cleaning up some of the pelvic adhesions to the small bowel and colon.  Once this was completed I started dissecting at the sacral promontory located 3 cm medial to the location where the ureter crosses over the right iliac vessels at the pelvic brim. The posterior peritoneum was incised and the sacral prominence cleared off an area taking care to avoid the middle sacral vessels and the iliac branches.  I then created a posterior peritoneal tunnel starting at the sacral promontory and tunneling down the right pelvic sidewall down into the pelvis breaking back through the posterior peritoneum around the vesico-vaginal junction posteriorly.  I then continued the posterior dissection retracting down on the rectum and finding the avascular plane between the posterior vaginal wall and the rectum.  I carried this dissection down as far as I could to along the area of the perineal body.  Then focused my attention to the uterus and hysterectomy.  I first started by taking the right round ligament with a series of by polar cautery.  I then dissected the anterior leaf of the broad ligament slightly more proximal and then distally down across the anterior mucosa to the salpinx and the internal cervical os.  I then took of the uterine ovarian ligament on the right and dissected free the right salpinx - the ovary was spared. Once the anterior leaf of the broad ligament had been completely dissected on the  patient's right and a small bladder flap had been created anteriorly attention was turned to the left side where a similar dissection was carried out. I then turned my attention to the anterior plane between the anterior vaginal wall  and the bladder.  I was able to obtain access to the avascular plane and with a combination of both monopolar cautery and blunt dissection was able to get down to the bladder neck.  I then turned my attention back to the patient's uterus and skeletonized the right uterine artery and vein and then took this with a series of bipolar moves.  I then performed a similar uterus pedicle ligation on the left.  At his point I was able to identify the patient's cervix and came through supracervical with monopolar cautery once the uterus was freed from all its attachments it was pushed into the left paracolic gutter and our attention was turned to placing the mesh.  Mesh was measured at approximately 7.5 cm anteriorly and 7.5 cm posteriorly and I cut this on the back table.  The mesh was then placed into the patient's abdomen through the assistant port and the anterior leaf was secured down onto the anterior vaginal wall with the apex at the bladder neck.  The posterior leaf was then secured down on the posterior vaginal wall.  These were sewn down with 2-0 Vicryl.  Between 6 and 8 were done on each side.  At this point I then went back to the previously dissected sacral promontory and posterior peritoneal tunnel and inserted a instrument through the tunnel and grasped the end of the mesh at the vaginal cuff and pull it up to the sacrum.  I then checked to ensure that the sacral mesh was not too tight by performing a vaginal exam.  I then secured the sacral leg of the mesh using a 0 Prolene.  I then reapproximated the posterior peritoneum with a 2-0 Vicryl in a running fashion around the sacral promontory.  The pelvic peritoneum was closed using a pursestring.  A small Endo Catch bag was then gently passed through the assistant port and the uterus was placed in the bag.  The bag was then brought out through the camera port once the trochars were removed.  We then made a slightly larger extraction incision to remove the  uterus.  The fascia was then closed with 0 Vicryl in a figure-of-eight fashion.  The skin was closed with 4-0 Monocryl's.  Dermabond was applied to the incision and exparel injected into the incisions.  The Foley catheter was then capped. The horseshoe [place] retractor was then applied to the drape and the Foley catheter was attached to it. Using the skin hooks for skin nodes were placed in the corners of the labia minora to open up the vaginal vestibule. 5 cc of quarter percent Marcaine with 1% epinephrine was then injected into the periurethral tissue. The suprapubic incisions were then marked out 1 cm lateral to midline and 1 cm above the pubic bone. Using a 15 blade a stab incision was made in both sides of midline. Gauze is placed over these areas to control the skin bleeding. A 1.5 cm incision was then made in the mid urethra through the vaginal mucosa. Using this Lee scissors dissection was then carried out. Her urethra we laterally on both sides to the endopelvic fascia. The dissection was completed once there was enough space to place a finger through the incision on both sides of the urethra.  Using the [facility] needle set both needles were passed through the stab incisions suprapubically down posterior to the pubic bone and then through the periurethral incisions using the index finger to guide the needle out of the incision. Once both needles had been placed the Foley catheter was removed and cystoscopy was performed. A 70 lens was passed gently through the patient's urethra and into the bladder under visual guidance. A 360 cystoscopic evaluation was performed. There was no mucosal abnormalities or evidence of perforation from the needles. The cystoscope was then removed and the Foley catheter replaced. The bladder was then drained again and the Foley catheter. The ends of the sling were then attached to the needles and the needles pulled up through the retropubic space and out the  suprapubic incision. Once the sling was noted to be centered, the blue plastic cap at the apex of the sling was cut and the plastic sheath was then pulled out. The mesh was noted to be well seated around the urethra. A right angle was used to ensure that the proper amount of tension for the sling around the urethra applied. The sling was noted to be tension free and well positioned. At this point copious amounts of double antibiotic irrigation was then used to irrigate the incision the periurethral space and the vagina. The incision was then closed with 2-0 Vicryl in a running fashion. The stab incisions in the suprapubic area were closed with Dermabond.   Estrace impregnated packing was then placed into her vagina which will be left in overnight.  The patient was subsequently extubated and returned to the PACU in excellent condition.

## 2022-04-24 ENCOUNTER — Encounter: Payer: Self-pay | Admitting: Dermatology

## 2022-04-24 DIAGNOSIS — N8189 Other female genital prolapse: Secondary | ICD-10-CM | POA: Diagnosis not present

## 2022-04-24 LAB — BASIC METABOLIC PANEL
Anion gap: 5 (ref 5–15)
BUN: 9 mg/dL (ref 6–20)
CO2: 27 mmol/L (ref 22–32)
Calcium: 8.6 mg/dL — ABNORMAL LOW (ref 8.9–10.3)
Chloride: 109 mmol/L (ref 98–111)
Creatinine, Ser: 0.8 mg/dL (ref 0.44–1.00)
GFR, Estimated: >60 mL/min (ref >60)
Glucose, Bld: 98 mg/dL (ref 70–99)
Potassium: 4 mmol/L (ref 3.5–5.1)
Sodium: 141 mmol/L (ref 135–145)

## 2022-04-24 LAB — HEMOGLOBIN AND HEMATOCRIT, BLOOD
HCT: 32.2 % — ABNORMAL LOW (ref 36.0–46.0)
Hemoglobin: 9.7 g/dL — ABNORMAL LOW (ref 12.0–15.0)

## 2022-04-24 NOTE — Progress Notes (Addendum)
  Transition of Care [facility]) Screening Note   Patient Details  Name: Linda Ware Date of Birth: [birth year]   Transition of Care [facility]) CM/SW Contact:    Henrietta Dine, RN Phone Number: [phone] 04/24/2022, 1:57 PM    Transition of Care Department [facility]) has reviewed patient and no TOC needs have been identified at this time. We will continue to monitor patient advancement through interdisciplinary progression rounds. If new patient transition needs arise, please place a TOC consult.

## 2022-04-24 NOTE — Discharge Summary (Signed)
Date of admission: 04/23/2022  Date of discharge: 04/24/2022  Admission diagnosis: Pelvic Organ Prolapse, Stress urinary incontinence  Discharge diagnosis: Same  Secondary diagnoses: None  History and Physical: For full details, please see admission history and physical. Briefly, Linda Ware is a 51 y.o. year old patient with a PMH of pelvic organ prolapse and stress urinary incontinence. She presented for scheduled robotic sacrocolpopexy, hysterectomy with bilateral salpingectomy, and cystoscopy with mid-urethral sling.   Hospital Course: Patient underwent a robotic sacrocolpopexy, hysterectomy with bilateral salpingectomy, and cystoscopy with mid-urethral sling with Dr. Louis Meckel on 04/23/22. She tolerated the procedure well. Post-operatively, she was taken to the PACU for routine post-operative care. She was transferred to the floor for overnight observation. By the morning of POD#1, patient was doing exceptionally well. Her pain was well-controlled, she was tolerating PO without nausea/vomiting, and ambulating without difficulty. Her vaginal packing was removed on POD#1 and she passed a TOV. POD#1 labs were notable for normal Cr and Hgb 9.7 from pre-op 11.8. She was otherwise hemodynamically stable without tachycardia or hypotension, so recheck was deferred. She was deemed appropriate for discharge.   Laboratory values:  Recent Labs    04/24/22 0650  HGB 9.7*  HCT 32.2*   Recent Labs    04/24/22 0650  CREATININE 0.80    Disposition: Home  Discharge instruction: The patient was instructed to be ambulatory but told to refrain from heavy lifting, strenuous activity, or driving.   Discharge medications:  Allergies as of 04/24/2022       Reactions   Lactose Intolerance (gi)    Bloating, extra bowel movements   Clindamycin/lincomycin Rash        Medication List     STOP taking these medications    oxyCODONE-acetaminophen 5-325 MG tablet Commonly known as: Percocet        TAKE these medications    albuterol 108 (90 Base) MCG/ACT inhaler Commonly known as: VENTOLIN HFA Inhale 1-2 puffs into the lungs every 6 (six) hours as needed for wheezing or shortness of breath.   albuterol (2.5 MG/3ML) 0.083% nebulizer solution Commonly known as: PROVENTIL Take 2.5 mg by nebulization every 6 (six) hours as needed for wheezing or shortness of breath.   BIOTIN PO Take 10,000 mcg by mouth daily.   CITRACAL PO Take 1 tablet by mouth daily.   estradiol 0.1 MG/GM vaginal cream Commonly known as: ESTRACE VAGINAL Place 1 Applicatorful vaginally 3 (three) times a week. Use 1 small dolyp of cream on tip of index finger and swap the inside of the vagina. ( Do not need to use the applicator)   Evening Primrose Oil 500 MG Caps Take 500 mg by mouth daily.   famotidine 20 MG tablet Commonly known as: PEPCID Take 20 mg by mouth at bedtime.   fluticasone-salmeterol 250-50 MCG/ACT Aepb Commonly known as: ADVAIR Inhale 1 puff into the lungs 2 (two) times daily as needed (shortness of breath).   gabapentin 100 MG capsule Commonly known as: NEURONTIN Take 100 mg by mouth at bedtime. What changed: Another medication with the same name was added. Make sure you understand how and when to take each.   gabapentin 300 MG capsule Commonly known as: Neurontin Take 1 capsule (300 mg total) by mouth at bedtime. What changed: You were already taking a medication with the same name, and this prescription was added. Make sure you understand how and when to take each.   Iron 325 (65 Fe) MG Tabs Take 1 tablet (325 mg total)  by mouth daily. What changed: when to take this   levothyroxine 200 MCG tablet Commonly known as: SYNTHROID Take 200 mcg by mouth daily before breakfast.   loratadine 10 MG tablet Commonly known as: CLARITIN Take 10 mg by mouth daily.   magnesium oxide 400 (240 Mg) MG tablet Commonly known as: MAG-OX Take 400 mg by mouth daily.   mesalamine 1.2 g  EC tablet Commonly known as: LIALDA Take 1.2 g by mouth daily.   montelukast 10 MG tablet Commonly known as: SINGULAIR Take 10 mg by mouth daily.   ondansetron 4 MG disintegrating tablet Commonly known as: ZOFRAN-ODT Take 1 tablet (4 mg total) by mouth every 8 (eight) hours as needed for nausea or vomiting.   Potassium 99 MG Tabs Take 99 mg by mouth daily.   pyridoxine 500 MG tablet Commonly known as: B-6 Take 500 mg by mouth daily.   sulfamethoxazole-trimethoprim 800-160 MG tablet Commonly known as: BACTRIM DS Take 1 tablet by mouth 2 (two) times daily. Start taking one day prior to your appointment for your first follow-up and catheter removal.  Continue taking for three days.   traMADol 50 MG tablet Commonly known as: Ultram Take 1-2 tablets (50-100 mg total) by mouth every 6 (six) hours as needed for moderate pain.   vitamin C 1000 MG tablet Take 1,000 mg by mouth daily.   Vitamin D (Ergocalciferol) 1.25 MG (50000 UNIT) Caps capsule Commonly known as: DRISDOL Take 1 capsule (50,000 Units total) by mouth every 7 (seven) days.   Vitamin D 50 MCG (2000 UT) Caps Take 2,000 Units by mouth daily.   Vitamin D3 125 MCG (5000 UT) Tabs Take 1 tablet (5,000 Units total) by mouth daily.   VITAMIN K2-VITAMIN D3 PO Take 1 tablet by mouth daily.   ZINC PO Take 50 mg by mouth daily.        Followup:   Follow-up Information     Hollace Hayward, NP Follow up on 05/07/2022.   Why: 10am Contact information: [address]. [place] [id number] [phone]

## 2022-04-25 ENCOUNTER — Encounter (HOSPITAL_COMMUNITY): Payer: Self-pay | Admitting: Urology

## 2022-04-26 LAB — SURGICAL PATHOLOGY

## 2022-10-21 ENCOUNTER — Ambulatory Visit: Payer: Medicaid Other | Admitting: Dermatology

## 2022-10-21 VITALS — BP 118/62

## 2022-10-21 DIAGNOSIS — X32XXXA Exposure to sunlight, initial encounter: Secondary | ICD-10-CM

## 2022-10-21 DIAGNOSIS — D229 Melanocytic nevi, unspecified: Secondary | ICD-10-CM

## 2022-10-21 DIAGNOSIS — D2261 Melanocytic nevi of right upper limb, including shoulder: Secondary | ICD-10-CM

## 2022-10-21 DIAGNOSIS — Z1283 Encounter for screening for malignant neoplasm of skin: Secondary | ICD-10-CM

## 2022-10-21 DIAGNOSIS — L649 Androgenic alopecia, unspecified: Secondary | ICD-10-CM

## 2022-10-21 DIAGNOSIS — L814 Other melanin hyperpigmentation: Secondary | ICD-10-CM | POA: Diagnosis not present

## 2022-10-21 DIAGNOSIS — Z7189 Other specified counseling: Secondary | ICD-10-CM

## 2022-10-21 DIAGNOSIS — L578 Other skin changes due to chronic exposure to nonionizing radiation: Secondary | ICD-10-CM | POA: Diagnosis not present

## 2022-10-21 DIAGNOSIS — W908XXA Exposure to other nonionizing radiation, initial encounter: Secondary | ICD-10-CM

## 2022-10-21 DIAGNOSIS — Z86018 Personal history of other benign neoplasm: Secondary | ICD-10-CM

## 2022-10-21 NOTE — Patient Instructions (Signed)
Melanoma ABCDEs  Melanoma is the most dangerous type of skin cancer, and is the leading cause of death from skin disease.  You are more likely to develop melanoma if you: Have light-colored skin, light-colored eyes, or red or blond hair Spend a lot of time in the sun Tan regularly, either outdoors or in a tanning bed Have had blistering sunburns, especially during childhood Have a close family member who has had a melanoma Have atypical moles or large birthmarks  Early detection of melanoma is key since treatment is typically straightforward and cure rates are extremely high if we catch it early.   The first sign of melanoma is often a change in a mole or a new dark spot.  The ABCDE system is a way of remembering the signs of melanoma.  A for asymmetry:  The two halves do not match. B for border:  The edges of the growth are irregular. C for color:  A mixture of colors are present instead of an even brown color. D for diameter:  Melanomas are usually (but not always) greater than 6mm - the size of a pencil eraser. E for evolution:  The spot keeps changing in size, shape, and color.  Please check your skin once per month between visits. You can use a small mirror in front and a large mirror behind you to keep an eye on the back side or your body.   If you see any new or changing lesions before your next follow-up, please call to schedule a visit.  Please continue daily skin protection including broad spectrum sunscreen SPF 30+ to sun-exposed areas, reapplying every 2 hours as needed when you're outdoors.    Due to recent changes in healthcare laws, you may see results of your pathology and/or laboratory studies on MyChart before the doctors have had a chance to review them. We understand that in some cases there may be results that are confusing or concerning to you. Please understand that not all results are received at the same time and often the doctors may need to interpret multiple  results in order to provide you with the best plan of care or course of treatment. Therefore, we ask that you please give us 2 business days to thoroughly review all your results before contacting the office for clarification. Should we see a critical lab result, you will be contacted sooner.   If You Need Anything After Your Visit  If you have any questions or concerns for your doctor, please call our main line at [phone] and press option 4 to reach your doctor's medical assistant. If no one answers, please leave a voicemail as directed and we will return your call as soon as possible. Messages left after 4 pm will be answered the following business day.   You may also send us a message via MyChart. We typically respond to MyChart messages within 1-2 business days.  For prescription refills, please ask your pharmacy to contact our office. Our fax number is [phone].  If you have an urgent issue when the clinic is closed that cannot wait until the next business day, you can page your doctor at the number below.    Please note that while we do our best to be available for urgent issues outside of office hours, we are not available 24/7.   If you have an urgent issue and are unable to reach us, you may choose to seek medical care at your doctor's office, retail clinic, urgent care   center, or emergency room.  If you have a medical emergency, please immediately call 911 or go to the emergency department.  Pager Numbers  - Dr. Kowalski: [phone]  - Dr. Moye: [phone]  - Dr. Stewart: [phone]  In the event of inclement weather, please call our main line at [phone] for an update on the status of any delays or closures.  Dermatology Medication Tips: Please keep the boxes that topical medications come in in order to help keep track of the instructions about where and how to use these. Pharmacies typically print the medication instructions only on the boxes and not  directly on the medication tubes.   If your medication is too expensive, please contact our office at [phone] option 4 or send us a message through MyChart.   We are unable to tell what your co-pay for medications will be in advance as this is different depending on your insurance coverage. However, we may be able to find a substitute medication at lower cost or fill out paperwork to get insurance to cover a needed medication.   If a prior authorization is required to get your medication covered by your insurance company, please allow us 1-2 business days to complete this process.  Drug prices often vary depending on where the prescription is filled and some pharmacies may offer cheaper prices.  The website www.goodrx.com contains coupons for medications through different pharmacies. The prices here do not account for what the cost may be with help from insurance (it may be cheaper with your insurance), but the website can give you the price if you did not use any insurance.  - You can print the associated coupon and take it with your prescription to the pharmacy.  - You may also stop by our office during regular business hours and pick up a GoodRx coupon card.  - If you need your prescription sent electronically to a different pharmacy, notify our office through [facility] MyChart or by phone at [phone] option 4.     

## 2022-10-21 NOTE — Progress Notes (Signed)
Follow-Up Visit   Subjective  Linda Ware is a 52 y.o. female who presents for the following: Skin Cancer Screening and Full Body Skin Exam  The patient presents for Total-Body Skin Exam (TBSE) for skin cancer screening and mole check. The patient has spots, moles and lesions to be evaluated, some may be new or changing and the patient has concerns that these could be cancer.  Hx of dysplastic nevi.   The following portions of the chart were reviewed this encounter and updated as appropriate: medications, allergies, medical history  Review of Systems:  No other skin or systemic complaints except as noted in HPI or Assessment and Plan.  Objective  Well appearing patient in no apparent distress; mood and affect are within normal limits.  A full examination was performed including scalp, head, eyes, ears, nose, lips, neck, chest, axillae, abdomen, back, buttocks, bilateral upper extremities, bilateral lower extremities, hands, feet, fingers, toes, fingernails, and toenails. All findings within normal limits unless otherwise noted below.   Relevant physical exam findings are noted in the Assessment and Plan.    Assessment & Plan   LENTIGINES, SEBORRHEIC KERATOSES, HEMANGIOMAS - Benign normal skin lesions - Benign-appearing - Call for any changes  MELANOCYTIC NEVI - Tan-brown and/or pink-flesh-colored symmetric macules and papules - Benign appearing on exam today - Observation - Call clinic for new or changing moles - Recommend daily use of broad spectrum spf 30+ sunscreen to sun-exposed areas.   ACTINIC DAMAGE - Chronic condition, secondary to cumulative UV/sun exposure - diffuse scaly erythematous macules with underlying dyspigmentation - Recommend daily broad spectrum sunscreen SPF 30+ to sun-exposed areas, reapply every 2 hours as needed.  - Staying in the shade or wearing long sleeves, sun glasses (UVA+UVB protection) and wide brim hats (4-inch brim around the entire  circumference of the hat) are also recommended for sun protection.  - Call for new or changing lesions.  SKIN CANCER SCREENING PERFORMED TODAY.  History of Dysplastic Nevi - No evidence of recurrence today - Recommend regular full body skin exams - Recommend daily broad spectrum sunscreen SPF 30+ to sun-exposed areas, reapply every 2 hours as needed.  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  In women androgenetic alopecia is commonly associated with menopause but may occur any time after puberty.  It causes hair thinning primarily on the crown with widening of the part and temporal hairline recession.  Can use OTC Rogaine (minoxidil) 5% solution/foam as directed.  Oral treatments in female patients who have no contraindication may include : - Low dose oral minoxidil 1.25 - 5mg  daily - Spironolactone 50 - 100mg  bid - Finasteride 2.5 - 5 mg daily Adjunctive therapies include: - Low Level Laser Light Therapy (LLLT) - Platelet-rich plasma injections (PRP) - Hair Transplants or scalp reduction   Treatment Plan: Patient defers treatment  Return in about 2 years (around 10/20/2024) for TBSE, Hx Dysplastic Nevi.  Anise Salvo, RMA, am acting as scribe for Armida Sans, MD .   Documentation: I have reviewed the above documentation for accuracy and completeness, and I agree with the above.  Armida Sans, MD

## 2022-10-23 ENCOUNTER — Encounter: Payer: Self-pay | Admitting: Dermatology

## 2022-12-02 ENCOUNTER — Ambulatory Visit: Payer: Medicaid Other | Attending: Family Medicine

## 2022-12-02 DIAGNOSIS — M25512 Pain in left shoulder: Secondary | ICD-10-CM | POA: Insufficient documentation

## 2022-12-02 DIAGNOSIS — G8929 Other chronic pain: Secondary | ICD-10-CM | POA: Diagnosis present

## 2022-12-02 DIAGNOSIS — M25612 Stiffness of left shoulder, not elsewhere classified: Secondary | ICD-10-CM

## 2022-12-02 NOTE — Therapy (Signed)
[facility] [address] [place], [address] Phone: [phone]   Fax:  [phone]  Physical Therapy Evaluation  Patient Details  Name: Linda Ware MRN: [id number] Date of Birth: 30-Sep-1970 Referring Provider (PT): Ziglar, Eli Phillips, MD   Encounter Date: 12/02/2022   PT End of Session - 12/02/22 1033     Visit Number 1    Number of Visits 17    Date for PT Re-Evaluation 01/28/23    PT Start Time 1034    PT Stop Time 1109    PT Time Calculation (min) 35 min    Activity Tolerance Patient tolerated treatment well    Behavior During Therapy [facility] for tasks assessed/performed             Past Medical History:  Diagnosis Date   Alcohol abuse    in college, none in years   Allergy    Anemia    Asthma, allergic    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 Atypical intraepidermal melanocytic neoplasm with mild to moderate atypia. Excised 03/18/2010, margins free.   B12 deficiency    Blood in stool    Chicken pox    Constipation    Crohn's disease (HCC)    Cystocele    Depression    Dysplastic nevus 03/26/2008   Left side/waistline. Slight to moderate atypia and halo nevus effect.   Dysplastic nevus 02/03/2010   Left mid side, minimal atypia.   Dysplastic nevus 03/25/2010   Right volar wrist. Mild atypia, edges free.   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   Hypothyroidism    IBS (irritable bowel syndrome)    Incontinence of urine in female    Iron deficiency anemia    Joint pain    Lactose intolerance    Obesity    Obesity (BMI 30-39.9) 04/24/2012   Other  specified disorders of thyroid    Plantar fasciitis    Pre-diabetes    Pregnant    [redacted] weeks   Primary thyroid cancer (HCC) 2007   papillary   Rectocele    Rosacea    Sleep apnea    intermittent   Stomach ulcer    Swallowing difficulty    Swelling of both lower extremities    Thyroid cancer (HCC)    Vitamin D deficiency     Past Surgical History:  Procedure Laterality Date   ESOPHAGOGASTRODUODENOSCOPY  02/19/2009   Dr. Lurline Del   HERNIA REPAIR     kidney stone removal     x multiple occasions   PUBOVAGINAL SLING N/A 04/23/2022   Procedure: MID URETHRAL SLING, CYSTOSCOPY;  Surgeon: Crist Fat, MD;  Location: [facility];  Service: Urology;  Laterality: N/A;  270 MINUTES NEEDED FOR CASE   ROBOTIC ASSISTED LAPAROSCOPIC HYSTERECTOMY AND SALPINGECTOMY N/A 04/23/2022   Procedure: XI ROBOTIC ASSISTED LAPAROSCOPIC SUPRACERVICAL HYSTERECTOMY AND BILATERAL SALPINGECTOMY;  Surgeon: Crist Fat, MD;  Location: [facility];  Service: Urology;  Laterality: N/A;   ROBOTIC ASSISTED LAPAROSCOPIC SACROCOLPOPEXY N/A 04/23/2022   Procedure: XI ROBOTIC ASSISTED LAPAROSCOPIC SACROCOLPOPEXY;  Surgeon: Berniece Salines  W, MD;  Location: [facility];  Service: Urology;  Laterality: N/A;   THYROIDECTOMY  2007   tonsillectomy      There were no vitals filed for this visit.    Subjective Assessment - 12/02/22 1037     Subjective L shoulder: 0/10 currently and for the past 3 months. Main problem is the stiffness. Takes the gabapentin at night whe pt lays on her L side/shoulder.    Pertinent History L shoulder stiffness. L shoulder proximal humerus fx June 2023. Fracture has healed. Did PT for 6 months. Wants to try PT again to improve ROM prior to doing surgery. R hand dominant. Stiffness has not changed for the past 6-7 months.    Patient Stated Goals Improve L shoulder ROM.    Currently in Pain? No/denies                [facility] PT Assessment - 12/02/22 1041       Assessment    Medical Diagnosis M25.612 (ICD-10-CM) - Stiffness of left shoulder, not elsewhere classified    Referring Provider (PT) Ziglar, Eli Phillips, MD    Onset Date/Surgical Date 11/25/22   Date PT referral signed.   Prior Therapy yes      Precautions   Precaution Comments Pt states she is borderline osteopenia/osteoporosis.      Restrictions   Other Position/Activity Restrictions No known restrictions      Balance Screen   Has the patient fallen in the past 6 months No    Has the patient had a decrease in activity level because of a fear of falling?  Yes    Is the patient reluctant to leave their home because of a fear of falling?  No      Posture/Postural Control   Posture Comments forward neck, B protracted shoulders, movement crease around C5/C6 area, slight kyphosis,      AROM   Overall AROM Comments R UE AROM WFL    Left Shoulder Flexion 85 Degrees   with anterior shoulder discofort; 145 AAROM; supine AROM 140 degrees   Left Shoulder ABduction 72 Degrees   115 degrees AAROM; supine AROM 105 degrees   Left Shoulder Internal Rotation --   Functional IR: L thumb to L3 spinous process   Left Shoulder External Rotation --   Functional ER: L middle finger to C5 TP     Strength   Left Shoulder Flexion 3-/5   4/5 at available range   Left Shoulder ABduction 3-/5   4/5 at available range   Left Shoulder Internal Rotation 4/5    Left Shoulder External Rotation 4-/5      Palpation   Palpation comment B upper trap muscle tension;  L teres major muscle tension                        Objective measurements completed on examination: See above findings.   No latex allergies  No blood pressure problems per pt.   Pt states she is borderline osteopenia/osteoporosis.   Does wall slides for L shoulder flexion AAROM at home  B upper trap muscle tension; L teres major muscle tension     Manual therapy  Supine STM L teres major and subscapularis muscles to decrease tension   112 degrees L shoulder flexion AROM in standing afterwards   Therapeutic exercise Supine L shoulder flexion 1 lbs 10x5 seconds for 2 sets   Then with eccentric return after the last 2 repetitions  Improved exercise technique, movement at target joints, use of target muscles after mod verbal, visual, tactile cues.    Response to treatment Improved standing L shoulder flexion AROM after treatment to decrease L teres major and subscapularis muscle tension.   Clinical impression Pt is a 52 year old female who came to physical therapy secondary to L shoulder stiffness. She also presents with altered posture, limited L shoulder flexion, abduction, ER and IR AROM, decreased L shoulder strength; muscle tension, and difficulty performing tasks which involve raising her L arm up secondary to stiffness and weakness. Pt will benefit from skilled physical therapy services to address the aforementioned deficits.                    PT Short Term Goals - 12/02/22 1351       PT SHORT TERM GOAL #1   Title Pt will be independent with her initial HEP to improve L shoulder AROM, strength, and function.    Baseline Pt has not yet started her HEP (12/02/2022)    Time 3    Period Weeks    Status New    Target Date 12/24/22               PT Long Term Goals - 12/02/22 1352       PT LONG TERM GOAL #1   Title Pt will improve L shoulder AROM for flexion and abduction to 145 degrees and 130 degrees respectively to promote ability to reach up and to the side with less difficulty.    Baseline L shoulder AROM for flexion 85 degrees, abduction 72 degrees (12/02/2022)    Time 8    Period Weeks    Status New    Target Date 01/28/23      PT LONG TERM GOAL #2   Title Pt will improve L shoulder functional IR to L thumb to T10 spinous process to promote ability to reach behind her back as well as don and doff clothes with less difficulty.    Baseline L shoulder functional IR: L thumb to L3  spinous process (12/02/2022)    Time 8    Period Weeks    Status New    Target Date 01/28/23      PT LONG TERM GOAL #3   Title Pt will improve L shoulder flexion, abduction, ER, IR strength by at least 1/2 MMT at available range to promote ability to raise her arm up with less difficulty.    Baseline At available range: flexion 4/5, abduction 4/5, ER 4-/5, IR 4/5 (12/02/2022)    Time 8    Period Weeks    Status New    Target Date 01/28/23      PT LONG TERM GOAL #4   Title Pt will improve her L shoulder FOTO score by at least 10 points as a demonstration of improved function.    Baseline L shoulder FOTO 54 (12/02/2022)    Time 8    Period Weeks    Status New    Target Date 01/28/23                    Plan - 12/02/22 1358     Clinical Impression Statement Pt is a 52 year old female who came to physical therapy secondary to L shoulder stiffness. She also presents with altered posture, limited L shoulder flexion, abduction, ER and IR AROM, decreased L shoulder strength; muscle tension, and difficulty performing tasks which involve raising her  L arm up secondary to stiffness and weakness. Pt will benefit from skilled physical therapy services to address the aforementioned deficits.    Personal Factors and Comorbidities Comorbidity 3+;Fitness;Past/Current Experience;Time since onset of injury/illness/exacerbation    Comorbidities Depression, joint pain, thyroid CA, sleep apnea    Examination-Activity Limitations Reach Overhead;Dressing;Lift    Stability/Clinical Decision Making Stable/Uncomplicated    Clinical Decision Making Low    Rehab Potential Fair    PT Frequency 2x / week    PT Duration 8 weeks    PT Treatment/Interventions Therapeutic activities;Therapeutic exercise;Neuromuscular re-education;Manual techniques;Dry needling;Electrical Stimulation    PT Next Visit Plan scapular, ER, IR strengthening, AAROM, manual techniques, modalities PRN    Consulted and Agree with Plan  of Care Patient             Patient will benefit from skilled therapeutic intervention in order to improve the following deficits and impairments:  Pain, Postural dysfunction, Improper body mechanics, Impaired UE functional use, Decreased strength, Decreased range of motion  Visit Diagnosis: Stiffness of left shoulder, not elsewhere classified - Plan: PT plan of care cert/re-cert  Chronic left shoulder pain - Plan: PT plan of care cert/re-cert     Problem List Patient Active Problem List   Diagnosis Date Noted   Pelvic prolapse 04/23/2022   At risk for activity intolerance 05/20/2020   Crohn's disease with complication (HCC) 04/29/2020   Other hemorrhoids 04/10/2020   At risk for complication associated with hypotension 04/10/2020   Depression 03/20/2020   At risk for osteoporosis 03/20/2020   Prediabetes 03/06/2020   Other specified hypothyroidism 03/06/2020   Absolute anemia 03/06/2020   At risk for diabetes mellitus 03/06/2020   Mood disorder (HCC) 02/21/2020   Vitamin B12 deficiency 02/21/2020   Vitamin D deficiency 02/21/2020   Anemia 02/21/2020   OSA (obstructive sleep apnea) 02/21/2020   Diarrhea 05/12/2018   Other dysphagia 05/12/2018   Rectal bleeding 05/12/2018   Abnormal uterine bleeding 08/04/2017   Stress incontinence 08/20/2015   Vaginal pessary in situ 08/20/2015   Cystocele with small rectocele and uterine descent 08/20/2015   Cystocele or rectocele with incomplete uterine prolapse 01/29/2015   Hypersomnia with sleep apnea 10/02/2014   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/2014   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 [place], [address] Phone: [phone]   Fax:  [phone]  Name: Linda Ware MRN: [id number] Date of Birth: 1970-09-17

## 2022-12-06 ENCOUNTER — Ambulatory Visit: Payer: Medicaid Other

## 2022-12-06 DIAGNOSIS — M25612 Stiffness of left shoulder, not elsewhere classified: Secondary | ICD-10-CM | POA: Diagnosis not present

## 2022-12-06 DIAGNOSIS — G8929 Other chronic pain: Secondary | ICD-10-CM

## 2022-12-06 NOTE — Therapy (Signed)
[facility] [address] [place], [address] Phone: [phone]   Fax:  [phone]  Physical Therapy Treatment  Patient Details  Name: Linda Ware MRN: [id number] Date of Birth: Jun 23, 1970 Referring Provider (PT): Ziglar, Eli Phillips, MD   Encounter Date: 12/06/2022   PT End of Session - 12/06/22 0735     Visit Number 2    Number of Visits 17    Date for PT Re-Evaluation 01/28/23    PT Start Time 0735    PT Stop Time 0815    PT Time Calculation (min) 40 min    Activity Tolerance Patient tolerated treatment well    Behavior During Therapy [facility] for tasks assessed/performed              Past Medical History:  Diagnosis Date   Alcohol abuse    in college, none in years   Allergy    Anemia    Asthma, allergic    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 Atypical intraepidermal melanocytic neoplasm with mild to moderate atypia. Excised 03/18/2010, margins free.   B12 deficiency    Blood in stool    Chicken pox    Constipation    Crohn's disease (HCC)    Cystocele    Depression    Dysplastic nevus 03/26/2008   Left side/waistline. Slight to moderate atypia and halo nevus effect.   Dysplastic nevus 02/03/2010   Left mid side, minimal atypia.   Dysplastic nevus 03/25/2010   Right volar wrist. Mild atypia, edges free.   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   Hypothyroidism    IBS (irritable bowel syndrome)    Incontinence of urine in female    Iron deficiency anemia    Joint pain    Lactose intolerance    Obesity    Obesity (BMI 30-39.9) 04/24/2012    Other specified disorders of thyroid    Plantar fasciitis    Pre-diabetes    Pregnant    [redacted] weeks   Primary thyroid cancer (HCC) 2007   papillary   Rectocele    Rosacea    Sleep apnea    intermittent   Stomach ulcer    Swallowing difficulty    Swelling of both lower extremities    Thyroid cancer (HCC)    Vitamin D deficiency     Past Surgical History:  Procedure Laterality Date   ESOPHAGOGASTRODUODENOSCOPY  02/19/2009   Dr. Lurline Del   HERNIA REPAIR     kidney stone removal     x multiple occasions   PUBOVAGINAL SLING N/A 04/23/2022   Procedure: MID URETHRAL SLING, CYSTOSCOPY;  Surgeon: Crist Fat, MD;  Location: [facility];  Service: Urology;  Laterality: N/A;  270 MINUTES NEEDED FOR CASE   ROBOTIC ASSISTED LAPAROSCOPIC HYSTERECTOMY AND SALPINGECTOMY N/A 04/23/2022   Procedure: XI ROBOTIC ASSISTED LAPAROSCOPIC SUPRACERVICAL HYSTERECTOMY AND BILATERAL SALPINGECTOMY;  Surgeon: Crist Fat, MD;  Location: [facility];  Service: Urology;  Laterality: N/A;   ROBOTIC ASSISTED LAPAROSCOPIC SACROCOLPOPEXY N/A 04/23/2022   Procedure: XI ROBOTIC ASSISTED LAPAROSCOPIC SACROCOLPOPEXY;  Surgeon: Marlou Porch,  Earle Gell, MD;  Location: [facility];  Service: Urology;  Laterality: N/A;   THYROIDECTOMY  2007   tonsillectomy      There were no vitals filed for this visit.                      Objective measurements completed on examination: See above findings.    PCP: Alease Medina, MD   REFERRING PROVIDER: Alease Medina, MD   REFERRING DIAG: [phone] (ICD-10-CM) - Stiffness of left shoulder, not elsewhere classified   THERAPY DIAG:  Stiffness of left shoulder, not elsewhere classified  Chronic left shoulder pain  Rationale for Evaluation and Treatment: Rehabilitation  ONSET DATE:   11/25/22   Date PT referral signed      Rationale for Evaluation and Treatment Rehabilitation  PERTINENT HISTORY: L shoulder stiffness. L shoulder proximal  humerus fx June 2023. Fracture has healed. Did PT for 6 months. Wants to try PT again to improve ROM prior to doing surgery. R hand dominant. Stiffness has not changed for the past 6-7 months.   PRECAUTIONS:   Pt states she is borderline osteopenia/osteoporosis.        SUBJECTIVE:   SUBJECTIVE STATEMENT:  Feels like a little more flexibility. L shoulder just feels deformed. No pain currently.      PAIN:  Are you having pain? See subjective   TODAY'S TREATMENT:                                                                                                                                         DATE: 12/06/2022    No latex allergies  No blood pressure problems per pt.   Pt states she is borderline osteopenia/osteoporosis.   Does wall slides for L shoulder flexion AAROM at home  B upper trap muscle tension; L teres major muscle tension     Manual therapy  Supine STM L teres major and subscapularis muscles to decrease tension     Therapeutic exercise  Seated L shoulder self inferior glide 10x3 with 5 second holds  92 degrees flexion   Seated L triceps extension isometrics on table 10x3 with 5 second holds  Standing L shoulder extension red band 10x3  100 degrees L shoulder flexion AROM afterwards   Standing L shoulder ER at available range yellow band 10x2  Limited L shoulder ER AROM, stiff end feel   L shoulder IR red band with isometrics (hand pressing abdomen at the end) 10x5 seconds   Supine L shoulder flexion 1 lbs 10x5 seconds for 2 sets   Then with eccentric return   110 degrees L shoulder flexion AROM after aforementioned treatment.    Standing B scapular retraction red band 10x2 with 5 second holds    Improved exercise technique, movement at target joints, use of target muscles after mod verbal, visual, tactile cues.     Response  to treatment Pt tolerated session well without aggravation of symptoms.     Clinical impression Worked  on improving L shoulder joint mobility, decreasing subscapularis and teres major muscle tension, improving ER and IR strength to promote better glenohumeral mechanics. Improved L shoulder flexion AROM after session. Pt will benefit from continued skilled physical therapy services to improve strength, AROM, and function.      PATIENT EDUCATION: Education details: there-ex, HEP Person educated: Patient Education method: Explanation, Demonstration, Tactile cues, and Verbal cues, handout Education comprehension: verbalized understanding and returned demonstration  HOME EXERCISE PROGRAM: Access Code: [email or web address] URL: [email or web address] Date: 12/06/2022 Prepared by: Loralyn Freshwater  Exercises - Seated Shoulder Inferior Glide  - 3 x daily - 7 x weekly - 3 sets - 10 reps - 5 seconds hold  - Isometric Tricep Extension   - 1 x daily - 7 x weekly - 3 sets - 10 reps - 5 seconds hold  - Supine Shoulder Flexion Extension Full Range AROM  - 1 x daily - 7 x weekly - 3 sets - 10 reps - 5 seconds hold              PT Short Term Goals - 12/02/22 1351       PT SHORT TERM GOAL #1   Title Pt will be independent with her initial HEP to improve L shoulder AROM, strength, and function.    Baseline Pt has not yet started her HEP (12/02/2022)    Time 3    Period Weeks    Status New    Target Date 12/24/22               PT Long Term Goals - 12/02/22 1352       PT LONG TERM GOAL #1   Title Pt will improve L shoulder AROM for flexion and abduction to 145 degrees and 130 degrees respectively to promote ability to reach up and to the side with less difficulty.    Baseline L shoulder AROM for flexion 85 degrees, abduction 72 degrees (12/02/2022)    Time 8    Period Weeks    Status New    Target Date 01/28/23      PT LONG TERM GOAL #2   Title Pt will improve L shoulder functional IR to L thumb to T10 spinous process to promote ability to reach behind her back as well as don  and doff clothes with less difficulty.    Baseline L shoulder functional IR: L thumb to L3 spinous process (12/02/2022)    Time 8    Period Weeks    Status New    Target Date 01/28/23      PT LONG TERM GOAL #3   Title Pt will improve L shoulder flexion, abduction, ER, IR strength by at least 1/2 MMT at available range to promote ability to raise her arm up with less difficulty.    Baseline At available range: flexion 4/5, abduction 4/5, ER 4-/5, IR 4/5 (12/02/2022)    Time 8    Period Weeks    Status New    Target Date 01/28/23      PT LONG TERM GOAL #4   Title Pt will improve her L shoulder FOTO score by at least 10 points as a demonstration of improved function.    Baseline L shoulder FOTO 54 (12/02/2022)    Time 8    Period Weeks    Status New    Target Date 01/28/23  Plan - 12/06/22 0732     Clinical Impression Statement Worked on improving L shoulder joint mobility, decreasing subscapularis and teres major muscle tension, improving ER and IR strength to promote better glenohumeral mechanics. Improved L shoulder flexion AROM after session. Pt will benefit from continued skilled physical therapy services to improve strength, AROM, and function.    Personal Factors and Comorbidities Comorbidity 3+;Fitness;Past/Current Experience;Time since onset of injury/illness/exacerbation    Comorbidities Depression, joint pain, thyroid CA, sleep apnea    Examination-Activity Limitations Reach Overhead;Dressing;Lift    Stability/Clinical Decision Making Stable/Uncomplicated    Rehab Potential Fair    PT Frequency 2x / week    PT Duration 8 weeks    PT Treatment/Interventions Therapeutic activities;Therapeutic exercise;Neuromuscular re-education;Manual techniques;Dry needling;Electrical Stimulation    PT Next Visit Plan scapular, ER, IR strengthening, AAROM, manual techniques, modalities PRN    PT Home Exercise Plan Medbridge Access Code: [email or web address]    Consulted and  Agree with Plan of Care Patient             Patient will benefit from skilled therapeutic intervention in order to improve the following deficits and impairments:  Pain, Postural dysfunction, Improper body mechanics, Impaired UE functional use, Decreased strength, Decreased range of motion  Visit Diagnosis: Stiffness of left shoulder, not elsewhere classified  Chronic left shoulder pain     Problem List Patient Active Problem List   Diagnosis Date Noted   Pelvic prolapse 04/23/2022   At risk for activity intolerance 05/20/2020   Crohn's disease with complication (HCC) 04/29/2020   Other hemorrhoids 04/10/2020   At risk for complication associated with hypotension 04/10/2020   Depression 03/20/2020   At risk for osteoporosis 03/20/2020   Prediabetes 03/06/2020   Other specified hypothyroidism 03/06/2020   Absolute anemia 03/06/2020   At risk for diabetes mellitus 03/06/2020   Mood disorder (HCC) 02/21/2020   Vitamin B12 deficiency 02/21/2020   Vitamin D deficiency 02/21/2020   Anemia 02/21/2020   OSA (obstructive sleep apnea) 02/21/2020   Diarrhea 05/12/2018   Other dysphagia 05/12/2018   Rectal bleeding 05/12/2018   Abnormal uterine bleeding 08/04/2017   Stress incontinence 08/20/2015   Vaginal pessary in situ 08/20/2015   Cystocele with small rectocele and uterine descent 08/20/2015   Cystocele or rectocele with incomplete uterine prolapse 01/29/2015   Hypersomnia with sleep apnea 10/02/2014   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/2014   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, [place], [address] Phone: [phone]   Fax:  [phone]  Name: Linda Ware MRN: [id number] Date of Birth: [birth year]

## 2022-12-09 ENCOUNTER — Ambulatory Visit: Payer: Medicaid Other | Attending: Family Medicine

## 2022-12-09 DIAGNOSIS — G8929 Other chronic pain: Secondary | ICD-10-CM | POA: Insufficient documentation

## 2022-12-09 DIAGNOSIS — M25612 Stiffness of left shoulder, not elsewhere classified: Secondary | ICD-10-CM | POA: Diagnosis present

## 2022-12-09 DIAGNOSIS — M25512 Pain in left shoulder: Secondary | ICD-10-CM | POA: Insufficient documentation

## 2022-12-09 NOTE — Therapy (Signed)
[facility] [address] [place], [address] Phone: [phone]   Fax:  [phone]  Physical Therapy Treatment  Patient Details  Name: Linda Ware MRN: [id number] Date of Birth: 07/12/1970 Referring Provider (PT): Ziglar, Eli Phillips, MD   Encounter Date: 12/09/2022   PT End of Session - 12/09/22 0734     Visit Number 3    Number of Visits 17    Date for PT Re-Evaluation 01/28/23    PT Start Time 0734    PT Stop Time 0813    PT Time Calculation (min) 39 min    Activity Tolerance Patient tolerated treatment well    Behavior During Therapy [facility] for tasks assessed/performed               Past Medical History:  Diagnosis Date   Alcohol abuse    in college, none in years   Allergy    Anemia    Asthma, allergic    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 Atypical intraepidermal melanocytic neoplasm with mild to moderate atypia. Excised 03/18/2010, margins free.   B12 deficiency    Blood in stool    Chicken pox    Constipation    Crohn's disease (HCC)    Cystocele    Depression    Dysplastic nevus 03/26/2008   Left side/waistline. Slight to moderate atypia and halo nevus effect.   Dysplastic nevus 02/03/2010   Left mid side, minimal atypia.   Dysplastic nevus 03/25/2010   Right volar wrist. Mild atypia, edges free.   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   Hypothyroidism    IBS (irritable bowel syndrome)    Incontinence of urine in female    Iron deficiency anemia    Joint pain    Lactose intolerance    Obesity    Obesity (BMI 30-39.9) 04/24/2012    Other specified disorders of thyroid    Plantar fasciitis    Pre-diabetes    Pregnant    [redacted] weeks   Primary thyroid cancer (HCC) 2007   papillary   Rectocele    Rosacea    Sleep apnea    intermittent   Stomach ulcer    Swallowing difficulty    Swelling of both lower extremities    Thyroid cancer (HCC)    Vitamin D deficiency     Past Surgical History:  Procedure Laterality Date   ESOPHAGOGASTRODUODENOSCOPY  02/19/2009   Dr. Lurline Del   HERNIA REPAIR     kidney stone removal     x multiple occasions   PUBOVAGINAL SLING N/A 04/23/2022   Procedure: MID URETHRAL SLING, CYSTOSCOPY;  Surgeon: Crist Fat, MD;  Location: [facility];  Service: Urology;  Laterality: N/A;  270 MINUTES NEEDED FOR CASE   ROBOTIC ASSISTED LAPAROSCOPIC HYSTERECTOMY AND SALPINGECTOMY N/A 04/23/2022   Procedure: XI ROBOTIC ASSISTED LAPAROSCOPIC SUPRACERVICAL HYSTERECTOMY AND BILATERAL SALPINGECTOMY;  Surgeon: Crist Fat, MD;  Location: [facility];  Service: Urology;  Laterality: N/A;   ROBOTIC ASSISTED LAPAROSCOPIC SACROCOLPOPEXY N/A 04/23/2022   Procedure: XI ROBOTIC ASSISTED LAPAROSCOPIC SACROCOLPOPEXY;  Surgeon:  Crist Fat, MD;  Location: [facility];  Service: Urology;  Laterality: N/A;   THYROIDECTOMY  2007   tonsillectomy      There were no vitals filed for this visit.                      Objective measurements completed on examination: See above findings.    PCP: Alease Medina, MD   REFERRING PROVIDER: Alease Medina, MD   REFERRING DIAG: [phone] (ICD-10-CM) - Stiffness of left shoulder, not elsewhere classified   THERAPY DIAG:  Stiffness of left shoulder, not elsewhere classified  Chronic left shoulder pain  Rationale for Evaluation and Treatment: Rehabilitation  ONSET DATE:   11/25/22   Date PT referral signed      Rationale for Evaluation and Treatment Rehabilitation  PERTINENT HISTORY: L shoulder stiffness. L shoulder proximal  humerus fx June 2023. Fracture has healed. Did PT for 6 months. Wants to try PT again to improve ROM prior to doing surgery. R hand dominant. Stiffness has not changed for the past 6-7 months.   PRECAUTIONS: Pt states she is borderline osteopenia/osteoporosis.    SUBJECTIVE:   SUBJECTIVE STATEMENT: L shoulder was pretty stiff. Did her exercises the day before. The stiffness this morning is not terrible, no pain currently.      PAIN:  Are you having pain? See subjective   TODAY'S TREATMENT:                                                                                                                                         DATE: 12/09/2022    No latex allergies  No blood pressure problems per pt.   Pt states she is borderline osteopenia/osteoporosis.   Does wall slides for L shoulder flexion AAROM at home  B upper trap muscle tension; L teres major muscle tension     Therapeutic exercise  Seated L shoulder self inferior glide 10x3 with 5 second holds   Seated manually resisted L shoulder extension from end range flexion 10x3  Seated manually resisted L shoulder adduction from end range abduction 10x3  Standing L shoulder ER at available range yellow band 10x2  Limited L shoulder ER AROM, stiff end feel  Seated L triceps extension isometrics on table 10x3 with 5 second holds  Pulley AAROM   Flexion 10x5 seconds for 3 sets  Scaption 10x5 seconds for 3 sets  Abduction 10x5 seconds for 3 sets  Standing L shoulder AROM   Flexion 112 degrees  Abduction 93 degrees   Seated L shoulder ER AAROM with PVC pipe 10x5 seconds for 3 sets     Improved exercise technique, movement at target joints, use of target muscles after mod verbal, visual, tactile cues.     Response to treatment Pt tolerated session well without aggravation of symptoms.     Clinical impression Continued working on improving  L shoulder inferior glide and mobility to decrease stiffness. Added L  shoulder pulley AAROM to promote the strength to raise her hand higher than her AROM. Pt tolerated session well without aggravation of symptoms. Pt will benefit from continued skilled physical therapy services to improve strength, AROM, and function.      PATIENT EDUCATION: Education details: there-ex, HEP Person educated: Patient Education method: Explanation, Demonstration, Tactile cues, and Verbal cues, handout Education comprehension: verbalized understanding and returned demonstration  HOME EXERCISE PROGRAM: Access Code: [email or web address] URL: [email or web address] Date: 12/06/2022 Prepared by: Loralyn Freshwater  Exercises - Seated Shoulder Inferior Glide  - 3 x daily - 7 x weekly - 3 sets - 10 reps - 5 seconds hold  - Isometric Tricep Extension   - 1 x daily - 7 x weekly - 3 sets - 10 reps - 5 seconds hold  - Supine Shoulder Flexion Extension Full Range AROM  - 1 x daily - 7 x weekly - 3 sets - 10 reps - 5 seconds hold   - Seated Shoulder Flexion AAROM with Pulley Behind  - 3 x daily - 7 x weekly - 3 sets - 10 reps - 5 seconds hold - Seated Shoulder Abduction AAROM with Pulley Behind  - 3 x daily - 7 x weekly - 3 sets - 10 reps - 5 seconds hold   - Seated Shoulder External Rotation AAROM with Dowel  - 3 x daily - 7 x weekly - 3 sets - 10 reps - 5 seconds hold        PT Short Term Goals - 12/02/22 1351       PT SHORT TERM GOAL #1   Title Pt will be independent with her initial HEP to improve L shoulder AROM, strength, and function.    Baseline Pt has not yet started her HEP (12/02/2022)    Time 3    Period Weeks    Status New    Target Date 12/24/22               PT Long Term Goals - 12/02/22 1352       PT LONG TERM GOAL #1   Title Pt will improve L shoulder AROM for flexion and abduction to 145 degrees and 130 degrees respectively to promote ability to reach up and to the side with less difficulty.    Baseline L shoulder AROM for flexion 85 degrees,  abduction 72 degrees (12/02/2022)    Time 8    Period Weeks    Status New    Target Date 01/28/23      PT LONG TERM GOAL #2   Title Pt will improve L shoulder functional IR to L thumb to T10 spinous process to promote ability to reach behind her back as well as don and doff clothes with less difficulty.    Baseline L shoulder functional IR: L thumb to L3 spinous process (12/02/2022)    Time 8    Period Weeks    Status New    Target Date 01/28/23      PT LONG TERM GOAL #3   Title Pt will improve L shoulder flexion, abduction, ER, IR strength by at least 1/2 MMT at available range to promote ability to raise her arm up with less difficulty.    Baseline At available range: flexion 4/5, abduction 4/5, ER 4-/5, IR 4/5 (12/02/2022)    Time 8    Period Weeks    Status New    Target Date 01/28/23  PT LONG TERM GOAL #4   Title Pt will improve her L shoulder FOTO score by at least 10 points as a demonstration of improved function.    Baseline L shoulder FOTO 54 (12/02/2022)    Time 8    Period Weeks    Status New    Target Date 01/28/23                    Plan - 12/09/22 0733     Clinical Impression Statement Continued working on improving L shoulder inferior glide and mobility to decrease stiffness. Added L shoulder pulley AAROM to promote the strength to raise her hand higher than her AROM. Pt tolerated session well without aggravation of symptoms. Pt will benefit from continued skilled physical therapy services to improve strength, AROM, and function.    Personal Factors and Comorbidities Comorbidity 3+;Fitness;Past/Current Experience;Time since onset of injury/illness/exacerbation    Comorbidities Depression, joint pain, thyroid CA, sleep apnea    Examination-Activity Limitations Reach Overhead;Dressing;Lift    Stability/Clinical Decision Making Stable/Uncomplicated    Rehab Potential Fair    PT Frequency 2x / week    PT Duration 8 weeks    PT Treatment/Interventions  Therapeutic activities;Therapeutic exercise;Neuromuscular re-education;Manual techniques;Dry needling;Electrical Stimulation    PT Next Visit Plan scapular, ER, IR strengthening, AAROM, manual techniques, modalities PRN    PT Home Exercise Plan Medbridge Access Code: [email or web address]    Consulted and Agree with Plan of Care Patient             Patient will benefit from skilled therapeutic intervention in order to improve the following deficits and impairments:  Pain, Postural dysfunction, Improper body mechanics, Impaired UE functional use, Decreased strength, Decreased range of motion  Visit Diagnosis: Stiffness of left shoulder, not elsewhere classified  Chronic left shoulder pain     Problem List Patient Active Problem List   Diagnosis Date Noted   Pelvic prolapse 04/23/2022   At risk for activity intolerance 05/20/2020   Crohn's disease with complication (HCC) 04/29/2020   Other hemorrhoids 04/10/2020   At risk for complication associated with hypotension 04/10/2020   Depression 03/20/2020   At risk for osteoporosis 03/20/2020   Prediabetes 03/06/2020   Other specified hypothyroidism 03/06/2020   Absolute anemia 03/06/2020   At risk for diabetes mellitus 03/06/2020   Mood disorder (HCC) 02/21/2020   Vitamin B12 deficiency 02/21/2020   Vitamin D deficiency 02/21/2020   Anemia 02/21/2020   OSA (obstructive sleep apnea) 02/21/2020   Diarrhea 05/12/2018   Other dysphagia 05/12/2018   Rectal bleeding 05/12/2018   Abnormal uterine bleeding 08/04/2017   Stress incontinence 08/20/2015   Vaginal pessary in situ 08/20/2015   Cystocele with small rectocele and uterine descent 08/20/2015   Cystocele or rectocele with incomplete uterine prolapse 01/29/2015   Hypersomnia with sleep apnea 10/02/2014   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/2014   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 [place], [address] Phone: [phone]   Fax:  [phone]  Name: Linda  KYLEE Ware MRN: [id number] Date of Birth: [birth year]

## 2022-12-13 ENCOUNTER — Ambulatory Visit: Payer: Medicaid Other

## 2022-12-13 DIAGNOSIS — G8929 Other chronic pain: Secondary | ICD-10-CM

## 2022-12-13 DIAGNOSIS — M25612 Stiffness of left shoulder, not elsewhere classified: Secondary | ICD-10-CM | POA: Diagnosis not present

## 2022-12-13 NOTE — Therapy (Signed)
[facility] [address], [place], [address] Phone: [phone]   Fax:  [phone]  Physical Therapy Treatment  Patient Details  Name: Linda Ware MRN: [id number] Date of Birth: 1971/01/22 Referring Provider (PT): Ziglar, Eli Phillips, MD   Encounter Date: 12/13/2022   PT End of Session - 12/13/22 0733     Visit Number 4    Number of Visits 17    Date for PT Re-Evaluation 01/28/23    PT Start Time 0733    PT Stop Time 0815    PT Time Calculation (min) 42 min    Activity Tolerance Patient tolerated treatment well    Behavior During Therapy [facility] for tasks assessed/performed                Past Medical History:  Diagnosis Date   Alcohol abuse    in college, none in years   Allergy    Anemia    Asthma, allergic    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 Atypical intraepidermal melanocytic neoplasm with mild to moderate atypia. Excised 03/18/2010, margins free.   B12 deficiency    Blood in stool    Chicken pox    Constipation    Crohn's disease (HCC)    Cystocele    Depression    Dysplastic nevus 03/26/2008   Left side/waistline. Slight to moderate atypia and halo nevus effect.   Dysplastic nevus 02/03/2010   Left mid side, minimal atypia.   Dysplastic nevus 03/25/2010   Right volar wrist. Mild atypia, edges free.   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   Hypothyroidism    IBS (irritable bowel syndrome)    Incontinence of urine in female    Iron deficiency anemia    Joint pain    Lactose intolerance    Obesity    Obesity (BMI 30-39.9) 04/24/2012    Other specified disorders of thyroid    Plantar fasciitis    Pre-diabetes    Pregnant    [redacted] weeks   Primary thyroid cancer (HCC) 2007   papillary   Rectocele    Rosacea    Sleep apnea    intermittent   Stomach ulcer    Swallowing difficulty    Swelling of both lower extremities    Thyroid cancer (HCC)    Vitamin D deficiency     Past Surgical History:  Procedure Laterality Date   ESOPHAGOGASTRODUODENOSCOPY  02/19/2009   Dr. Lurline Del   HERNIA REPAIR     kidney stone removal     x multiple occasions   PUBOVAGINAL SLING N/A 04/23/2022   Procedure: MID URETHRAL SLING, CYSTOSCOPY;  Surgeon: Crist Fat, MD;  Location: [facility];  Service: Urology;  Laterality: N/A;  270 MINUTES NEEDED FOR CASE   ROBOTIC ASSISTED LAPAROSCOPIC HYSTERECTOMY AND SALPINGECTOMY N/A 04/23/2022   Procedure: XI ROBOTIC ASSISTED LAPAROSCOPIC SUPRACERVICAL HYSTERECTOMY AND BILATERAL SALPINGECTOMY;  Surgeon: Crist Fat, MD;  Location: [facility];  Service: Urology;  Laterality: N/A;   ROBOTIC ASSISTED LAPAROSCOPIC SACROCOLPOPEXY N/A 04/23/2022   Procedure: XI ROBOTIC ASSISTED LAPAROSCOPIC SACROCOLPOPEXY;  Surgeon: Crist Fat, MD;  Location: [facility];  Service: Urology;  Laterality: N/A;   THYROIDECTOMY  2007   tonsillectomy      There were no vitals filed for this visit.                      Objective measurements completed on examination: See above findings.    PCP: Alease Medina, MD   REFERRING PROVIDER: Alease Medina, MD   REFERRING DIAG: [phone] (ICD-10-CM) - Stiffness of left shoulder, not elsewhere classified   THERAPY DIAG:  Stiffness of left shoulder, not elsewhere classified  Chronic left shoulder pain  Rationale for Evaluation and Treatment: Rehabilitation  ONSET DATE:   11/25/22   Date PT referral signed      Rationale for Evaluation and Treatment Rehabilitation  PERTINENT HISTORY: L shoulder stiffness. L shoulder proximal  humerus fx June 2023. Fracture has healed. Did PT for 6 months. Wants to try PT again to improve ROM prior to doing surgery. R hand dominant. Stiffness has not changed for the past 6-7 months.   PRECAUTIONS: Pt states she is borderline osteopenia/osteoporosis.    SUBJECTIVE:   SUBJECTIVE STATEMENT: No pain or discomfort currently. Felt a sharp pain in L medial shoulder blade yesterday morning and last Friday night.      PAIN:  Are you having pain? See subjective   TODAY'S TREATMENT:                                                                                                                                         DATE: 12/13/2022    No latex allergies  No blood pressure problems per pt.   Pt states she is borderline osteopenia/osteoporosis.   Does wall slides for L shoulder flexion AAROM at home  B upper trap muscle tension; L teres major muscle tension     Therapeutic exercise  L first rib stretch  with strap 30 seconds x 3  Seated L shoulder ER AAROM with PVC pipe 30 seconds x 3 Seated L shoulder self inferior glide 10x3 with 5 second holds  Standing L shoulder ER at available range yellow band 10x2 with 5 second holds  Limited L shoulder ER AROM, stiff end feel  Seated L triceps extension isometrics on table 10x3 with 5 second holds  Pulley AAROM   Flexion 10x5 seconds for 3 sets  Scaption 10x5 seconds for 3 sets  Abduction 10x5 seconds for 3 sets    Standing L shoulder AROM   Flexion 112 degrees  Abduction 85 degrees      Improved exercise technique, movement at target joints, use of target muscles after mod verbal, visual, tactile cues.    Manual therapy  Supine STM L teres major and subscapularis muscles to decrease tension and improve L shoulder flexion AROM     Response to treatment Pt tolerated session well  without aggravation of symptoms.     Clinical impression  Added L first rib stretch to help decrease L medial scapular  symptoms. Continued working on improving L shoulder inferior glide and mobility to decrease stiffness as well as AAROM to promote the strength to raise her hand higher than her AROM. Pt tolerated session well without aggravation of symptoms. Pt will benefit from continued skilled physical therapy services to improve strength, AROM, and function.      PATIENT EDUCATION: Education details: there-ex, HEP Person educated: Patient Education method: Explanation, Demonstration, Tactile cues, and Verbal cues, handout Education comprehension: verbalized understanding and returned demonstration  HOME EXERCISE PROGRAM: Access Code: [email or web address] URL: [email or web address] Date: 12/06/2022 Prepared by: Loralyn Freshwater  Exercises - Seated Shoulder Inferior Glide  - 3 x daily - 7 x weekly - 3 sets - 10 reps - 5 seconds hold  - Isometric Tricep Extension   - 1 x daily - 7 x weekly - 3 sets - 10 reps - 5 seconds hold  - Supine Shoulder Flexion Extension Full Range AROM  - 1 x daily - 7 x weekly - 3 sets - 10 reps - 5 seconds hold   - Seated Shoulder Flexion AAROM with Pulley Behind  - 3 x daily - 7 x weekly - 3 sets - 10 reps - 5 seconds hold - Seated Shoulder Abduction AAROM with Pulley Behind  - 3 x daily - 7 x weekly - 3 sets - 10 reps - 5 seconds hold   - Seated Shoulder External Rotation AAROM with Dowel  - 3 x daily - 7 x weekly - 3 sets - 10 reps - 5 seconds hold  - First Rib Mobilization with Strap  - 2 x daily - 7 x weekly - 1 sets - 5 reps - 30 seconds hold      PT Short Term Goals - 12/02/22 1351       PT SHORT TERM GOAL #1   Title Pt will be independent with her initial HEP to improve L shoulder AROM, strength, and function.    Baseline Pt has not yet started her HEP (12/02/2022)    Time 3    Period Weeks    Status New    Target Date 12/24/22               PT Long Term Goals - 12/02/22 1352       PT LONG TERM GOAL #1   Title Pt will improve L shoulder AROM  for flexion and abduction to 145 degrees and 130 degrees respectively to promote ability to reach up and to the side with less difficulty.    Baseline L shoulder AROM for flexion 85 degrees, abduction 72 degrees (12/02/2022)    Time 8    Period Weeks    Status New    Target Date 01/28/23      PT LONG TERM GOAL #2   Title Pt will improve L shoulder functional IR to L thumb to T10 spinous process to promote ability to reach behind her back as well as don and doff clothes with less difficulty.    Baseline L shoulder functional IR: L thumb to L3 spinous process (12/02/2022)    Time 8    Period Weeks    Status New    Target Date 01/28/23      PT LONG TERM GOAL #3   Title Pt will improve L shoulder flexion, abduction, ER, IR strength by at least 1/2 MMT at available  range to promote ability to raise her arm up with less difficulty.    Baseline At available range: flexion 4/5, abduction 4/5, ER 4-/5, IR 4/5 (12/02/2022)    Time 8    Period Weeks    Status New    Target Date 01/28/23      PT LONG TERM GOAL #4   Title Pt will improve her L shoulder FOTO score by at least 10 points as a demonstration of improved function.    Baseline L shoulder FOTO 54 (12/02/2022)    Time 8    Period Weeks    Status New    Target Date 01/28/23                    Plan - 12/13/22 0731     Clinical Impression Statement Added L first rib stretch to help decrease L medial scapular symptoms. Continued working on improving L shoulder inferior glide and mobility to decrease stiffness as well as AAROM to promote the strength to raise her hand higher than her AROM. Pt tolerated session well without aggravation of symptoms. Pt will benefit from continued skilled physical therapy services to improve strength, AROM, and function.    Personal Factors and Comorbidities Comorbidity 3+;Fitness;Past/Current Experience;Time since onset of injury/illness/exacerbation    Comorbidities Depression, joint pain, thyroid CA,  sleep apnea    Examination-Activity Limitations Reach Overhead;Dressing;Lift    Stability/Clinical Decision Making Stable/Uncomplicated    Rehab Potential Fair    PT Frequency 2x / week    PT Duration 8 weeks    PT Treatment/Interventions Therapeutic activities;Therapeutic exercise;Neuromuscular re-education;Manual techniques;Dry needling;Electrical Stimulation    PT Next Visit Plan scapular, ER, IR strengthening, AAROM, manual techniques, modalities PRN    PT Home Exercise Plan Medbridge Access Code: [email or web address]    Consulted and Agree with Plan of Care Patient             Patient will benefit from skilled therapeutic intervention in order to improve the following deficits and impairments:  Pain, Postural dysfunction, Improper body mechanics, Impaired UE functional use, Decreased strength, Decreased range of motion  Visit Diagnosis: Stiffness of left shoulder, not elsewhere classified  Chronic left shoulder pain     Problem List Patient Active Problem List   Diagnosis Date Noted   Pelvic prolapse 04/23/2022   At risk for activity intolerance 05/20/2020   Crohn's disease with complication (HCC) 04/29/2020   Other hemorrhoids 04/10/2020   At risk for complication associated with hypotension 04/10/2020   Depression 03/20/2020   At risk for osteoporosis 03/20/2020   Prediabetes 03/06/2020   Other specified hypothyroidism 03/06/2020   Absolute anemia 03/06/2020   At risk for diabetes mellitus 03/06/2020   Mood disorder (HCC) 02/21/2020   Vitamin B12 deficiency 02/21/2020   Vitamin D deficiency 02/21/2020   Anemia 02/21/2020   OSA (obstructive sleep apnea) 02/21/2020   Diarrhea 05/12/2018   Other dysphagia 05/12/2018   Rectal bleeding 05/12/2018   Abnormal uterine bleeding 08/04/2017   Stress incontinence 08/20/2015   Vaginal pessary in situ 08/20/2015   Cystocele with small rectocele and uterine descent 08/20/2015   Cystocele or rectocele with incomplete uterine prolapse  01/29/2015   Hypersomnia with sleep apnea 10/02/2014   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/2014   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 [place], [address] Phone: [phone]   Fax:  [phone]  Name: VELDA SPARGUR MRN: [id number] Date of Birth: 04/04/1971

## 2022-12-15 ENCOUNTER — Ambulatory Visit: Payer: Medicaid Other

## 2022-12-23 ENCOUNTER — Ambulatory Visit: Payer: Medicaid Other

## 2022-12-23 DIAGNOSIS — M25612 Stiffness of left shoulder, not elsewhere classified: Secondary | ICD-10-CM

## 2022-12-23 DIAGNOSIS — G8929 Other chronic pain: Secondary | ICD-10-CM

## 2022-12-23 NOTE — Therapy (Signed)
[facility] [address] [place], [address] Phone: [phone]   Fax:  [phone]  Physical Therapy Treatment  Patient Details  Name: Linda Ware MRN: [id number] Date of Birth: 07-15-1970 Referring Provider (PT): Ziglar, Eli Phillips, MD   Encounter Date: 12/23/2022   PT End of Session - 12/23/22 0953     Visit Number 5    Number of Visits 17    Date for PT Re-Evaluation 01/28/23    PT Start Time 0952    PT Stop Time 1032    PT Time Calculation (min) 40 min    Activity Tolerance Patient tolerated treatment well    Behavior During Therapy [facility] for tasks assessed/performed                Past Medical History:  Diagnosis Date   Alcohol abuse    in college, none in years   Allergy    Anemia    Asthma, allergic    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 Atypical intraepidermal melanocytic neoplasm with mild to moderate atypia. Excised 03/18/2010, margins free.   B12 deficiency    Blood in stool    Chicken pox    Constipation    Crohn's disease (HCC)    Cystocele    Depression    Dysplastic nevus 03/26/2008   Left side/waistline. Slight to moderate atypia and halo nevus effect.   Dysplastic nevus 02/03/2010   Left mid side, minimal atypia.   Dysplastic nevus 03/25/2010   Right volar wrist. Mild atypia, edges free.   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   Hypothyroidism    IBS (irritable bowel syndrome)    Incontinence of urine in female    Iron deficiency anemia    Joint pain    Lactose intolerance    Obesity    Obesity (BMI 30-39.9) 04/24/2012    Other specified disorders of thyroid    Plantar fasciitis    Pre-diabetes    Pregnant    [redacted] weeks   Primary thyroid cancer (HCC) 2007   papillary   Rectocele    Rosacea    Sleep apnea    intermittent   Stomach ulcer    Swallowing difficulty    Swelling of both lower extremities    Thyroid cancer (HCC)    Vitamin D deficiency     Past Surgical History:  Procedure Laterality Date   ESOPHAGOGASTRODUODENOSCOPY  02/19/2009   Dr. Lurline Del   HERNIA REPAIR     kidney stone removal     x multiple occasions   PUBOVAGINAL SLING N/A 04/23/2022   Procedure: MID URETHRAL SLING, CYSTOSCOPY;  Surgeon: Crist Fat, MD;  Location: [facility];  Service: Urology;  Laterality: N/A;  270 MINUTES NEEDED FOR CASE   ROBOTIC ASSISTED LAPAROSCOPIC HYSTERECTOMY AND SALPINGECTOMY N/A 04/23/2022   Procedure: XI ROBOTIC ASSISTED LAPAROSCOPIC SUPRACERVICAL HYSTERECTOMY AND BILATERAL SALPINGECTOMY;  Surgeon: Crist Fat, MD;  Location: [facility];  Service: Urology;  Laterality: N/A;   ROBOTIC ASSISTED LAPAROSCOPIC SACROCOLPOPEXY N/A 04/23/2022   Procedure: XI ROBOTIC ASSISTED LAPAROSCOPIC SACROCOLPOPEXY;  Surgeon: Crist Fat, MD;  Location: [facility];  Service: Urology;  Laterality: N/A;   THYROIDECTOMY  2007   tonsillectomy      There were no vitals filed for this visit.   Objective measurements completed on examination: See above findings.    PCP: Alease Medina, MD   REFERRING PROVIDER: Alease Medina, MD   REFERRING DIAG: [phone] (ICD-10-CM) - Stiffness of left shoulder, not elsewhere classified   THERAPY DIAG:  Stiffness of left shoulder, not elsewhere classified  Chronic left shoulder pain  Rationale for Evaluation and Treatment: Rehabilitation  ONSET DATE:   11/25/22   Date PT referral signed      Rationale for Evaluation and Treatment Rehabilitation  PERTINENT HISTORY: L shoulder stiffness. L shoulder proximal humerus fx June 2023. Fracture has healed.  Did PT for 6 months. Wants to try PT again to improve ROM prior to doing surgery. R hand dominant. Stiffness has not changed for the past 6-7 months.   PRECAUTIONS: Pt states she is borderline osteopenia/osteoporosis.    SUBJECTIVE:   SUBJECTIVE STATEMENT: Pt denies any pain upon arrival to the clinic.  Pt reports that she has not been as consistent with her exercises lately.   PAIN:  Are you having pain? See subjective   TODAY'S TREATMENT: DATE: 12/23/22  No latex allergies  No blood pressure problems per pt.   Pt states she is borderline osteopenia/osteoporosis.   Does wall slides for L shoulder flexion AAROM at home  B upper trap muscle tension; L teres major muscle tension     TherEx:  Seated L shoulder ER AAROM with PVC pipe, 3x10 Seated L shoulder flexion AAROM with PVC pipe, 3x10 Supine rhythmic stabilization in all planes of mobility, 30 sec bouts x4 Supine ER reciprocal inhibition/MET (Muscle Energy Technique - steady 50% contraction into IR then relaxing into ER) to improve ER ROM of the shoulder, 3 sec holds,     Manual:  Supine PA mobilization, Grades III-IV for improved ROM of the shoulder, 30 sec bouts x multiple bouts Supine inferior mobilization, Grades III-IV for improved ROM of the shoulder, 30 sec bouts x multiple bouts Supine shoulder distraction for pain modulation and increasing joint space for improved ROM, 30 sec bouts x multiple bouts STM with TP release technique applied to the biceps/deltoid of the L shoulder where pt is experiencing reduced tissue extensibility    Seated L shoulder AROM   Flexion 121 degrees  Abduction 95 degrees      Improved exercise technique, movement at target joints, use of target muscles after mod verbal, visual, tactile cues.    Response to treatment Pt tolerated session well without aggravation of symptoms.     Clinical impression  Pt continues to put forth great effort and made significant  progress in therapy following the session.  Pt responded to the manual therapy well and the MET that allowed for a greater increase in overall ER of the L shoulder.  Pt encouraged to continue with current HEP and to perform movements at end range in order to keep the range that was gained today.  Pt agreeable and will continue to improve as therapy progresses.   Pt will continue to benefit from skilled therapy to address remaining deficits in order to improve overall QoL and return to PLOF.        PATIENT EDUCATION: Education details: there-ex, HEP Person educated: Patient Education method: Explanation, Demonstration, Tactile cues, and Verbal cues, handout Education comprehension: verbalized understanding and  returned demonstration  HOME EXERCISE PROGRAM: Access Code: [email or web address] URL: [email or web address] Date: 12/06/2022 Prepared by: Loralyn Freshwater  Exercises - Seated Shoulder Inferior Glide  - 3 x daily - 7 x weekly - 3 sets - 10 reps - 5 seconds hold  - Isometric Tricep Extension   - 1 x daily - 7 x weekly - 3 sets - 10 reps - 5 seconds hold  - Supine Shoulder Flexion Extension Full Range AROM  - 1 x daily - 7 x weekly - 3 sets - 10 reps - 5 seconds hold   - Seated Shoulder Flexion AAROM with Pulley Behind  - 3 x daily - 7 x weekly - 3 sets - 10 reps - 5 seconds hold - Seated Shoulder Abduction AAROM with Pulley Behind  - 3 x daily - 7 x weekly - 3 sets - 10 reps - 5 seconds hold   - Seated Shoulder External Rotation AAROM with Dowel  - 3 x daily - 7 x weekly - 3 sets - 10 reps - 5 seconds hold  - First Rib Mobilization with Strap  - 2 x daily - 7 x weekly - 1 sets - 5 reps - 30 seconds hold      PT Short Term Goals - 12/02/22 1351       PT SHORT TERM GOAL #1   Title Pt will be independent with her initial HEP to improve L shoulder AROM, strength, and function.    Baseline Pt has not yet started her HEP (12/02/2022)    Time 3    Period Weeks    Status New     Target Date 12/24/22               PT Long Term Goals - 12/02/22 1352       PT LONG TERM GOAL #1   Title Pt will improve L shoulder AROM for flexion and abduction to 145 degrees and 130 degrees respectively to promote ability to reach up and to the side with less difficulty.    Baseline L shoulder AROM for flexion 85 degrees, abduction 72 degrees (12/02/2022)    Time 8    Period Weeks    Status New    Target Date 01/28/23      PT LONG TERM GOAL #2   Title Pt will improve L shoulder functional IR to L thumb to T10 spinous process to promote ability to reach behind her back as well as don and doff clothes with less difficulty.    Baseline L shoulder functional IR: L thumb to L3 spinous process (12/02/2022)    Time 8    Period Weeks    Status New    Target Date 01/28/23      PT LONG TERM GOAL #3   Title Pt will improve L shoulder flexion, abduction, ER, IR strength by at least 1/2 MMT at available range to promote ability to raise her arm up with less difficulty.    Baseline At available range: flexion 4/5, abduction 4/5, ER 4-/5, IR 4/5 (12/02/2022)    Time 8    Period Weeks    Status New    Target Date 01/28/23      PT LONG TERM GOAL #4   Title Pt will improve her L shoulder FOTO score by at least 10 points as a demonstration of improved function.    Baseline L shoulder FOTO 54 (12/02/2022)    Time 8    Period Weeks    Status New  Target Date 01/28/23                      Patient will benefit from skilled therapeutic intervention in order to improve the following deficits and impairments:     Visit Diagnosis: Stiffness of left shoulder, not elsewhere classified  Chronic left shoulder pain     Problem List Patient Active Problem List   Diagnosis Date Noted   Pelvic prolapse 04/23/2022   At risk for activity intolerance 05/20/2020   Crohn's disease with complication (HCC) 04/29/2020   Other hemorrhoids 04/10/2020   At risk for complication  associated with hypotension 04/10/2020   Depression 03/20/2020   At risk for osteoporosis 03/20/2020   Prediabetes 03/06/2020   Other specified hypothyroidism 03/06/2020   Absolute anemia 03/06/2020   At risk for diabetes mellitus 03/06/2020   Mood disorder (HCC) 02/21/2020   Vitamin B12 deficiency 02/21/2020   Vitamin D deficiency 02/21/2020   Anemia 02/21/2020   OSA (obstructive sleep apnea) 02/21/2020   Diarrhea 05/12/2018   Other dysphagia 05/12/2018   Rectal bleeding 05/12/2018   Abnormal uterine bleeding 08/04/2017   Stress incontinence 08/20/2015   Vaginal pessary in situ 08/20/2015   Cystocele with small rectocele and uterine descent 08/20/2015   Cystocele or rectocele with incomplete uterine prolapse 01/29/2015   Hypersomnia with sleep apnea 10/02/2014   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/2014   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, [place], [address] Phone: [phone]   Fax:  [phone]  Name: Linda Ware MRN: [id number] Date of Birth: 1971-05-03

## 2022-12-30 ENCOUNTER — Ambulatory Visit: Payer: Medicaid Other

## 2022-12-30 DIAGNOSIS — M25612 Stiffness of left shoulder, not elsewhere classified: Secondary | ICD-10-CM

## 2022-12-30 DIAGNOSIS — G8929 Other chronic pain: Secondary | ICD-10-CM

## 2022-12-30 NOTE — Therapy (Signed)
[facility] [address] [place], [address] Phone: [phone]   Fax:  [phone]  Physical Therapy Treatment  Patient Details  Name: Linda Ware MRN: [id number] Date of Birth: 1970-10-09 Referring Provider (PT): Ziglar, Eli Phillips, MD   Encounter Date: 12/30/2022   PT End of Session - 12/30/22 1521     Visit Number 6    Number of Visits 17    Date for PT Re-Evaluation 01/28/23    PT Start Time 1521    PT Stop Time 1605    PT Time Calculation (min) 44 min    Activity Tolerance Patient tolerated treatment well    Behavior During Therapy [facility] for tasks assessed/performed                 Past Medical History:  Diagnosis Date   Alcohol abuse    in college, none in years   Allergy    Anemia    Asthma, allergic    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 Atypical intraepidermal melanocytic neoplasm with mild to moderate atypia. Excised 03/18/2010, margins free.   B12 deficiency    Blood in stool    Chicken pox    Constipation    Crohn's disease (HCC)    Cystocele    Depression    Dysplastic nevus 03/26/2008   Left side/waistline. Slight to moderate atypia and halo nevus effect.   Dysplastic nevus 02/03/2010   Left mid side, minimal atypia.   Dysplastic nevus 03/25/2010   Right volar wrist. Mild atypia, edges free.   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   Hypothyroidism    IBS (irritable bowel syndrome)    Incontinence of urine in female    Iron deficiency anemia    Joint pain    Lactose intolerance    Obesity    Obesity (BMI 30-39.9) 04/24/2012    Other specified disorders of thyroid    Plantar fasciitis    Pre-diabetes    Pregnant    [redacted] weeks   Primary thyroid cancer (HCC) 2007   papillary   Rectocele    Rosacea    Sleep apnea    intermittent   Stomach ulcer    Swallowing difficulty    Swelling of both lower extremities    Thyroid cancer (HCC)    Vitamin D deficiency     Past Surgical History:  Procedure Laterality Date   ESOPHAGOGASTRODUODENOSCOPY  02/19/2009   Dr. Lurline Del   HERNIA REPAIR     kidney stone removal     x multiple occasions   PUBOVAGINAL SLING N/A 04/23/2022   Procedure: MID URETHRAL SLING, CYSTOSCOPY;  Surgeon: Crist Fat, MD;  Location: [facility];  Service: Urology;  Laterality: N/A;  270 MINUTES NEEDED FOR CASE   ROBOTIC ASSISTED LAPAROSCOPIC HYSTERECTOMY AND SALPINGECTOMY N/A 04/23/2022   Procedure: XI ROBOTIC ASSISTED LAPAROSCOPIC SUPRACERVICAL HYSTERECTOMY AND BILATERAL SALPINGECTOMY;  Surgeon: Crist Fat, MD;  Location: [facility];  Service: Urology;  Laterality: N/A;   ROBOTIC ASSISTED LAPAROSCOPIC SACROCOLPOPEXY N/A 04/23/2022   Procedure: XI ROBOTIC ASSISTED LAPAROSCOPIC SACROCOLPOPEXY;  Surgeon: Crist Fat, MD;  Location: [facility];  Service: Urology;  Laterality: N/A;   THYROIDECTOMY  2007   tonsillectomy      There were no vitals filed for this visit.   Objective measurements completed on examination: See above findings.    PCP: Alease Medina, MD   REFERRING PROVIDER: Alease Medina, MD   REFERRING DIAG: [phone] (ICD-10-CM) - Stiffness of left shoulder, not elsewhere classified   THERAPY DIAG:  Stiffness of left shoulder, not elsewhere classified  Chronic left shoulder pain  Rationale for Evaluation and Treatment: Rehabilitation  ONSET DATE:   11/25/22   Date PT referral signed      Rationale for Evaluation and Treatment Rehabilitation  PERTINENT HISTORY: L shoulder stiffness. L shoulder proximal humerus fx June 2023. Fracture has  healed. Did PT for 6 months. Wants to try PT again to improve ROM prior to doing surgery. R hand dominant. Stiffness has not changed for the past 6-7 months.   PRECAUTIONS: Pt states she is borderline osteopenia/osteoporosis.    SUBJECTIVE:   SUBJECTIVE STATEMENT: L shoulder is not too bad but not any better with the reaching. No pain currently. Has not been doing the triceps isometrics and self inferior shoulder glide at home.    PAIN:  Are you having pain? See subjective   TODAY'S TREATMENT: DATE: 12/30/22  No latex allergies  No blood pressure problems per pt.   Pt states she is borderline osteopenia/osteoporosis.   Does wall slides for L shoulder flexion AAROM at home  B upper trap muscle tension; L teres major muscle tension     TherEx: standing L shoulder AROM at start of session.              Flexion 102 degrees             Abduction 80 degrees    Seated L shoulder abduction AAROM with PT to end range 5x5 seconds   Seated with L shoulder in end range abduction,   PT manually resisted L shoulder adduction isometrics 10x5 seconds for 2 sets  Seated L shoulder ER isometrics at end range with PT manual resistance 10x5 seconds   Sitting with sustained A to P pressure to L humeral head  L shoulder ER AROM 10x3  Again, Seated L shoulder ER isometrics at end range with PT manual resistance 10x5 seconds   Sitting with L arm propped onto elevated mat table  L elbow extnsion isometrics in the slightly flexed position 10x3 with 5 second holds    L shoulder extension from end range flexion with PT manual resistance with eccentric extension 10x   Improved exercise technique, movement at target joints, use of target muscles after mod verbal, visual, tactile cues.     Manual:   Sitting with L arm propped on table into flexion  STM to L teres major muscle to decrease tension     Response to treatment Pt tolerated session well without aggravation of symptoms.      Clinical impression  Worked on improving L posterior shoulder strengthening to promote inferior glide of L humeral head during shoulder flexion and abduction AROM. Pt demonstrates ER muscle weakness. No improvement in L shoulder AROM after today's session. Pt will continue to benefit from skilled therapy to address remaining deficits in order to improve overall QoL and return to PLOF.       PATIENT EDUCATION: Education details: there-ex, HEP Person educated: Patient Education method: Explanation, Demonstration, Tactile cues, and Verbal  cues, handout Education comprehension: verbalized understanding and returned demonstration  HOME EXERCISE PROGRAM: Access Code: [email or web address] URL: [email or web address] Date: 12/06/2022 Prepared by: Loralyn Freshwater  Exercises - Seated Shoulder Inferior Glide  - 3 x daily - 7 x weekly - 3 sets - 10 reps - 5 seconds hold  - Isometric Tricep Extension   - 1 x daily - 7 x weekly - 3 sets - 10 reps - 5 seconds hold  - Supine Shoulder Flexion Extension Full Range AROM  - 1 x daily - 7 x weekly - 3 sets - 10 reps - 5 seconds hold   - Seated Shoulder Flexion AAROM with Pulley Behind  - 3 x daily - 7 x weekly - 3 sets - 10 reps - 5 seconds hold - Seated Shoulder Abduction AAROM with Pulley Behind  - 3 x daily - 7 x weekly - 3 sets - 10 reps - 5 seconds hold   - Seated Shoulder External Rotation AAROM with Dowel  - 3 x daily - 7 x weekly - 3 sets - 10 reps - 5 seconds hold  - First Rib Mobilization with Strap  - 2 x daily - 7 x weekly - 1 sets - 5 reps - 30 seconds hold      PT Short Term Goals - 12/30/22 1524       PT SHORT TERM GOAL #1   Title Pt will be independent with her initial HEP to improve L shoulder AROM, strength, and function.    Baseline Pt has not yet started her HEP (12/02/2022); doing her exercises, not as often as she should, no questions (12/30/2022)    Time 3    Period Weeks    Status Achieved    Target Date  12/24/22               PT Long Term Goals - 12/02/22 1352       PT LONG TERM GOAL #1   Title Pt will improve L shoulder AROM for flexion and abduction to 145 degrees and 130 degrees respectively to promote ability to reach up and to the side with less difficulty.    Baseline L shoulder AROM for flexion 85 degrees, abduction 72 degrees (12/02/2022)    Time 8    Period Weeks    Status New    Target Date 01/28/23      PT LONG TERM GOAL #2   Title Pt will improve L shoulder functional IR to L thumb to T10 spinous process to promote ability to reach behind her back as well as don and doff clothes with less difficulty.    Baseline L shoulder functional IR: L thumb to L3 spinous process (12/02/2022)    Time 8    Period Weeks    Status New    Target Date 01/28/23      PT LONG TERM GOAL #3   Title Pt will improve L shoulder flexion, abduction, ER, IR strength by at least 1/2 MMT at available range to promote ability to raise her arm up with less difficulty.    Baseline At available range: flexion 4/5, abduction 4/5, ER 4-/5, IR 4/5 (12/02/2022)    Time 8    Period Weeks    Status New    Target Date 01/28/23      PT LONG TERM GOAL #4   Title Pt will improve her L shoulder FOTO score by at least 10 points as a demonstration of improved function.    Baseline L  shoulder FOTO 54 (12/02/2022)    Time 8    Period Weeks    Status New    Target Date 01/28/23                    Plan - 12/30/22 1519     Clinical Impression Statement Worked on improving L posterior shoulder strengthening to promote inferior glide of L humeral head during shoulder flexion and abduction AROM. Pt demonstrates ER muscle weakness. No improvement in L shoulder AROM after today's session. Pt will continue to benefit from skilled therapy to address remaining deficits in order to improve overall QoL and return to PLOF.    Personal Factors and Comorbidities Comorbidity 3+;Fitness;Past/Current Experience;Time  since onset of injury/illness/exacerbation    Comorbidities Depression, joint pain, thyroid CA, sleep apnea    Examination-Activity Limitations Reach Overhead;Dressing;Lift    Stability/Clinical Decision Making Stable/Uncomplicated    Rehab Potential Fair    PT Frequency 2x / week    PT Duration 8 weeks    PT Treatment/Interventions Therapeutic activities;Therapeutic exercise;Neuromuscular re-education;Manual techniques;Dry needling;Electrical Stimulation    PT Next Visit Plan scapular, ER, IR strengthening, AAROM, manual techniques, modalities PRN    PT Home Exercise Plan Medbridge Access Code: [email or web address]    Consulted and Agree with Plan of Care Patient              Patient will benefit from skilled therapeutic intervention in order to improve the following deficits and impairments:  Pain, Postural dysfunction, Improper body mechanics, Impaired UE functional use, Decreased strength, Decreased range of motion  Visit Diagnosis: Stiffness of left shoulder, not elsewhere classified  Chronic left shoulder pain     Problem List Patient Active Problem List   Diagnosis Date Noted   Pelvic prolapse 04/23/2022   At risk for activity intolerance 05/20/2020   Crohn's disease with complication (HCC) 04/29/2020   Other hemorrhoids 04/10/2020   At risk for complication associated with hypotension 04/10/2020   Depression 03/20/2020   At risk for osteoporosis 03/20/2020   Prediabetes 03/06/2020   Other specified hypothyroidism 03/06/2020   Absolute anemia 03/06/2020   At risk for diabetes mellitus 03/06/2020   Mood disorder (HCC) 02/21/2020   Vitamin B12 deficiency 02/21/2020   Vitamin D deficiency 02/21/2020   Anemia 02/21/2020   OSA (obstructive sleep apnea) 02/21/2020   Diarrhea 05/12/2018   Other dysphagia 05/12/2018   Rectal bleeding 05/12/2018   Abnormal uterine bleeding 08/04/2017   Stress incontinence 08/20/2015   Vaginal pessary in situ 08/20/2015   Cystocele with small  rectocele and uterine descent 08/20/2015   Cystocele or rectocele with incomplete uterine prolapse 01/29/2015   Hypersomnia with sleep apnea 10/02/2014   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/2014   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 [address] [place], [address] Phone: [phone]   Fax:  [phone]  Name: Linda Ware MRN: [id number] Date of Birth: August 22, 1970

## 2023-01-03 ENCOUNTER — Ambulatory Visit (INDEPENDENT_AMBULATORY_CARE_PROVIDER_SITE_OTHER): Payer: Medicaid Other | Admitting: Nurse Practitioner

## 2023-01-03 ENCOUNTER — Encounter: Payer: Self-pay | Admitting: Nurse Practitioner

## 2023-01-03 VITALS — BP 130/80 | HR 80 | Temp 98.0°F | Ht 64.0 in | Wt 267.0 lb

## 2023-01-03 DIAGNOSIS — D509 Iron deficiency anemia, unspecified: Secondary | ICD-10-CM

## 2023-01-03 DIAGNOSIS — Z85850 Personal history of malignant neoplasm of thyroid: Secondary | ICD-10-CM | POA: Diagnosis not present

## 2023-01-03 DIAGNOSIS — E038 Other specified hypothyroidism: Secondary | ICD-10-CM

## 2023-01-03 DIAGNOSIS — J4520 Mild intermittent asthma, uncomplicated: Secondary | ICD-10-CM

## 2023-01-03 DIAGNOSIS — E559 Vitamin D deficiency, unspecified: Secondary | ICD-10-CM

## 2023-01-03 DIAGNOSIS — J3089 Other allergic rhinitis: Secondary | ICD-10-CM

## 2023-01-03 DIAGNOSIS — E538 Deficiency of other specified B group vitamins: Secondary | ICD-10-CM

## 2023-01-03 DIAGNOSIS — K219 Gastro-esophageal reflux disease without esophagitis: Secondary | ICD-10-CM

## 2023-01-03 DIAGNOSIS — R7303 Prediabetes: Secondary | ICD-10-CM

## 2023-01-03 DIAGNOSIS — Z6841 Body Mass Index (BMI) 40.0 and over, adult: Secondary | ICD-10-CM

## 2023-01-03 NOTE — Patient Instructions (Addendum)
Walk or do something to increase HR  for 10 mins after you eat.   Decrease sugar intake. Decrease sugar intake- only do sweets for special occasion.   Labs in 1 month for follow up

## 2023-01-03 NOTE — Progress Notes (Unsigned)
Careteam: Patient Care Team: Sharon Seller, NP as PCP - General (Geriatric Medicine) Jim Like, RN as Registered Nurse Scarlett Presto, RN (Inactive) as Registered Nurse  PLACE OF SERVICE:  [facility]  Advanced Directive information    Allergies  Allergen Reactions   Clindamycin/Lincomycin Rash    Chief Complaint  Patient presents with   Establish Care    Establish Care     HPI: Patient is a 52 y.o. female to establish care.   Had thyroid cancer in 2007 seen regularly unsure of last CPE.   GERD- not controlled, uses pepcid 20-40 mg daily sometimes will use tums PRN  Asthma- very well controlled, only uses advair if she has an URI and needs it.  Broke her left humerus last June- did not have surgery- just started PT for decrease in ROM  Continues to have pain and uses gabapentin 300 mg at bedtime to help with pain so she can sleep.   Hypothyroid- has not seen endocrinologist in years, thyroid cancer in 2007. TSH was recently decreased 11/29/22  Has crohns coloitis- followed by [facility] (unsure of her name)    OSA not on CPAP, loss weight and came off CPAP but now has gained it back plus.   Vit D def- not on supplement  Esophageal stricture- has had dilated twice.   Morbid obesity- did not do well with weight and wellness felt like she was starving herself. And then gained it all back.     Review of Systems:  Review of Systems  Constitutional:  Negative for chills, fever and weight loss.  HENT:  Negative for tinnitus.   Respiratory:  Negative for cough, sputum production and shortness of breath.   Cardiovascular:  Negative for chest pain, palpitations and leg swelling.  Gastrointestinal:  Negative for abdominal pain, constipation, diarrhea and heartburn.  Genitourinary:  Negative for dysuria, frequency and urgency.  Musculoskeletal:  Negative for back pain, falls, joint pain and myalgias.  Skin: Negative.   Neurological:  Negative for dizziness  and headaches.  Psychiatric/Behavioral:  Negative for depression and memory loss. The patient does not have insomnia.   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 Atypical intraepidermal melanocytic neoplasm with mild to moderate atypia. Excised 03/18/2010, margins free.   B12 deficiency    Blood in stool    Chicken pox    Constipation    Crohn's disease (HCC)    Cystocele    Depression    Dysplastic nevus 03/26/2008   Left side/waistline. Slight to moderate atypia and halo nevus effect.   Dysplastic nevus 02/03/2010   Left mid side, minimal atypia.   Dysplastic nevus 03/25/2010   Right volar wrist. Mild atypia, edges free.   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   Hypothyroidism    IBS (irritable bowel syndrome)    Incontinence of urine in female    Iron deficiency anemia  Joint pain    Lactose intolerance    Obesity    Obesity (BMI 30-39.9) 04/24/2012   Other specified disorders of thyroid    Plantar fasciitis    Pre-diabetes    Pregnant    [redacted] weeks   Primary thyroid cancer (HCC) 2007   papillary   Rectocele    Rosacea    Sleep apnea    intermittent   Stomach ulcer    Swallowing difficulty    Swelling of both lower extremities    Thyroid cancer (HCC)    Vitamin D deficiency    Past Surgical History:  Procedure Laterality Date   ESOPHAGOGASTRODUODENOSCOPY  02/19/2009   Dr. Lurline Del   HERNIA REPAIR     kidney stone removal     x multiple occasions   PUBOVAGINAL SLING N/A  04/23/2022   Procedure: MID URETHRAL SLING, CYSTOSCOPY;  Surgeon: Crist Fat, MD;  Location: [facility];  Service: Urology;  Laterality: N/A;  270 MINUTES NEEDED FOR CASE   ROBOTIC ASSISTED LAPAROSCOPIC HYSTERECTOMY AND SALPINGECTOMY N/A 04/23/2022   Procedure: XI ROBOTIC ASSISTED LAPAROSCOPIC SUPRACERVICAL HYSTERECTOMY AND BILATERAL SALPINGECTOMY;  Surgeon: Crist Fat, MD;  Location: [facility];  Service: Urology;  Laterality: N/A;   ROBOTIC ASSISTED LAPAROSCOPIC SACROCOLPOPEXY N/A 04/23/2022   Procedure: XI ROBOTIC ASSISTED LAPAROSCOPIC SACROCOLPOPEXY;  Surgeon: Crist Fat, MD;  Location: [facility];  Service: Urology;  Laterality: N/A;   THYROIDECTOMY  2007   tonsillectomy     Social History:   reports that she quit smoking about 29 years ago. Her smoking use included cigarettes. She has never used smokeless tobacco. She reports that she does not currently use alcohol. She reports that she does not use drugs.  Family History  Problem Relation Age of Onset   Asthma Mother    Hyperlipidemia Mother    Depression Mother    Hypothyroidism Mother    Obesity Mother    Hyperthyroidism Father    Depression Father    Early death Father    Hypothyroidism Father    Bipolar disorder Father    Depression Sister    Asthma Maternal Grandmother    Dementia Maternal Grandmother    Hypertension Maternal Grandmother    Multiple myeloma Maternal Grandmother    Heart disease Maternal Grandmother    Dementia Maternal Grandfather    Melanoma Maternal Grandfather    Heart attack Maternal Grandfather    Diabetes type II Maternal Grandfather    Diabetes Maternal Grandfather    Heart disease Maternal Grandfather    Breast cancer Paternal Grandmother    Hyperlipidemia Paternal Grandmother    Hypothyroidism Paternal Grandmother    Inflammatory bowel disease Paternal Grandmother    Colon cancer Cousin    Heart attack Paternal Uncle    Diabetes type II Paternal Aunt    Diabetes type II  Cousin    Diabetes type II Paternal Uncle    Hyperlipidemia Paternal Grandfather    Hypertension Paternal Grandfather    Hypothyroidism Paternal Grandfather    Pancreatic cancer Paternal Grandfather    Cancer Paternal Grandfather    Hypertension Paternal Uncle    Hypertension Paternal Aunt    Kidney disease Other    Stroke Maternal Uncle    Celiac disease Sister        questionable   COPD Neg Hx     Medications: Patient's Medications  New Prescriptions   No medications on file  Previous Medications   ALBUTEROL (VENTOLIN HFA) 108 (90 BASE) MCG/ACT INHALER  Inhale 1-2 puffs into the lungs every 6 (six) hours as needed for wheezing or shortness of breath.   FAMOTIDINE (PEPCID) 20 MG TABLET    Take 20 mg by mouth at bedtime.   FLUTICASONE-SALMETEROL (ADVAIR) 250-50 MCG/ACT AEPB    Inhale 1 puff into the lungs 2 (two) times daily as needed (shortness of breath).   GABAPENTIN (NEURONTIN) 300 MG CAPSULE    Take 1 capsule (300 mg total) by mouth at bedtime.   LEVOTHYROXINE SODIUM 175 MCG CAPS    Take 175 mcg by mouth daily before breakfast.  Modified Medications   No medications on file  Discontinued Medications   ALBUTEROL (PROVENTIL) (2.5 MG/3ML) 0.083% NEBULIZER SOLUTION    Take 2.5 mg by nebulization every 6 (six) hours as needed for wheezing or shortness of breath.   ASCORBIC ACID (VITAMIN C) 1000 MG TABLET    Take 1,000 mg by mouth daily.   BIOTIN PO    Take 10,000 mcg by mouth daily.   CALCIUM CITRATE (CITRACAL PO)    Take 1 tablet by mouth daily.   CHOLECALCIFEROL (VITAMIN D) 50 MCG (2000 UT) CAPS    Take 2,000 Units by mouth daily.   CHOLECALCIFEROL (VITAMIN D3) 125 MCG (5000 UT) TABS    Take 1 tablet (5,000 Units total) by mouth daily.   ESTRADIOL (ESTRACE VAGINAL) 0.1 MG/GM VAGINAL CREAM    Place 1 Applicatorful vaginally 3 (three) times a week. Use 1 small dolyp of cream on tip of index finger and swap the inside of the vagina. ( Do not need to use the applicator)   EVENING  PRIMROSE OIL 500 MG CAPS    Take 500 mg by mouth daily.   FERROUS SULFATE (IRON) 325 (65 FE) MG TABS    Take 1 tablet (325 mg total) by mouth daily.   GABAPENTIN (NEURONTIN) 100 MG CAPSULE    Take 100 mg by mouth at bedtime.   LORATADINE (CLARITIN) 10 MG TABLET    Take 10 mg by mouth daily.   MAGNESIUM OXIDE (MAG-OX) 400 (240 MG) MG TABLET    Take 400 mg by mouth daily.   MESALAMINE (LIALDA) 1.2 G EC TABLET    Take 1.2 g by mouth daily.   MONTELUKAST (SINGULAIR) 10 MG TABLET    Take 10 mg by mouth daily.   MULTIPLE VITAMINS-MINERALS (ZINC PO)    Take 50 mg by mouth daily.   ONDANSETRON (ZOFRAN-ODT) 4 MG DISINTEGRATING TABLET    Take 1 tablet (4 mg total) by mouth every 8 (eight) hours as needed for nausea or vomiting.   POTASSIUM 99 MG TABS    Take 99 mg by mouth daily.   PYRIDOXINE (B-6) 500 MG TABLET    Take 500 mg by mouth daily.   SULFAMETHOXAZOLE-TRIMETHOPRIM (BACTRIM DS) 800-160 MG TABLET    Take 1 tablet by mouth 2 (two) times daily. Start taking one day prior to your appointment for your first follow-up and catheter removal.  Continue taking for three days.   TRAMADOL (ULTRAM) 50 MG TABLET    Take 1-2 tablets (50-100 mg total) by mouth every 6 (six) hours as needed for moderate pain.   VITAMIN D, ERGOCALCIFEROL, (DRISDOL) 1.25 MG (50000 UNIT) CAPS CAPSULE    Take 1 capsule (50,000 Units total) by mouth every 7 (seven) days.   VITAMIN D-VITAMIN K (VITAMIN K2-VITAMIN D3 PO)    Take 1 tablet by mouth daily.    Physical Exam:  Vitals:   01/03/23 1509  BP: 130/80  Pulse: 80  Temp: 98 F (36.7 C)  SpO2: 97%  Weight: 267 lb (121.1 kg)  Height: 5\' 4"  (1.626 m)   Body mass index is 45.83 kg/m. Wt Readings from Last 3 Encounters:  01/03/23 267 lb (121.1 kg)  04/23/22 247 lb 15.9 oz (112.5 kg)  04/06/22 248 lb (112.5 kg)    Physical Exam Constitutional:      General: She is not in acute distress.    Appearance: She is well-developed. She is not diaphoretic.  HENT:     Head:  Normocephalic and atraumatic.     Mouth/Throat:     Pharynx: No oropharyngeal exudate.  Eyes:     Conjunctiva/sclera: Conjunctivae normal.     Pupils: Pupils are equal, round, and reactive to light.  Cardiovascular:     Rate and Rhythm: Normal rate and regular rhythm.     Heart sounds: Normal heart sounds.  Pulmonary:     Effort: Pulmonary effort is normal.     Breath sounds: Normal breath sounds.  Abdominal:     General: Bowel sounds are normal.     Palpations: Abdomen is soft.  Musculoskeletal:     Cervical back: Normal range of motion and neck supple.     Right lower leg: No edema.     Left lower leg: No edema.  Skin:    General: Skin is warm and dry.  Neurological:     Mental Status: She is alert.  Psychiatric:        Mood and Affect: Mood normal.   ***  Labs reviewed: Basic Metabolic Panel: Recent Labs    04/06/22 0943 04/24/22 0650  NA 140 141  K 4.0 4.0  CL 106 109  CO2 25 27  GLUCOSE 99 98  BUN 17 9  CREATININE 0.87 0.80  CALCIUM 9.4 8.6*   Liver Function Tests: No results for input(s): "AST", "ALT", "ALKPHOS", "BILITOT", "PROT", "ALBUMIN" in the last 8760 hours. No results for input(s): "LIPASE", "AMYLASE" in the last 8760 hours. No results for input(s): "AMMONIA" in the last 8760 hours. CBC: Recent Labs    04/06/22 0943 04/24/22 0650  WBC 13.1*  --   HGB 11.5* 9.7*  HCT 38.3 32.2*  MCV 81.8  --   PLT 346  --    Lipid Panel: No results for input(s): "CHOL", "HDL", "LDLCALC", "TRIG", "CHOLHDL", "LDLDIRECT" in the last 8760 hours. TSH: No results for input(s): "TSH" in the last 8760 hours. A1C: Lab Results  Component Value Date   HGBA1C 5.9 (H) 04/06/2022     Assessment/Plan There are no diagnoses linked to this encounter.  No follow-ups on file.: ***  Ebelyn Bohnet K. Biagio Borg [facility] [phone]

## 2023-01-04 ENCOUNTER — Encounter: Payer: Self-pay | Admitting: Nurse Practitioner

## 2023-01-05 ENCOUNTER — Ambulatory Visit: Payer: Medicaid Other

## 2023-01-05 DIAGNOSIS — M25612 Stiffness of left shoulder, not elsewhere classified: Secondary | ICD-10-CM

## 2023-01-05 DIAGNOSIS — G8929 Other chronic pain: Secondary | ICD-10-CM

## 2023-01-05 NOTE — Therapy (Signed)
[facility] [address], [place], [address] Phone: [phone]   Fax:  [phone]  Physical Therapy Treatment  Patient Details  Name: Linda Ware MRN: [id number] Date of Birth: 1970/12/27 Referring Provider (PT): Ziglar, Eli Phillips, MD   Encounter Date: 01/05/2023   PT End of Session - 01/05/23 1040     Visit Number 7    Number of Visits 17    Date for PT Re-Evaluation 01/28/23    PT Start Time 1040    PT Stop Time 1115    PT Time Calculation (min) 35 min    Activity Tolerance Patient tolerated treatment well    Behavior During Therapy [facility] for tasks assessed/performed                  Past Medical History:  Diagnosis Date   Anemia    Asthma, allergic    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 Atypical intraepidermal melanocytic neoplasm with mild to moderate atypia. Excised 03/18/2010, margins free.   B12 deficiency    Chicken pox    Constipation    Crohn's disease (HCC)    Cystocele    Dysplastic nevus 03/26/2008   Left side/waistline. Slight to moderate atypia and halo nevus effect.   Dysplastic nevus 02/03/2010   Left mid side, minimal atypia.   Dysplastic nevus 03/25/2010   Right volar wrist. Mild atypia, edges free.   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 Lurline Del   HERNIA REPAIR     kidney stone removal     x multiple occasions   PUBOVAGINAL SLING N/A 04/23/2022   Procedure: MID URETHRAL SLING, CYSTOSCOPY;  Surgeon: Crist Fat, MD;  Location: [facility];  Service: Urology;  Laterality: N/A;  270 MINUTES NEEDED FOR CASE   ROBOTIC ASSISTED LAPAROSCOPIC HYSTERECTOMY AND SALPINGECTOMY N/A 04/23/2022   Procedure: XI ROBOTIC ASSISTED LAPAROSCOPIC SUPRACERVICAL HYSTERECTOMY AND BILATERAL SALPINGECTOMY;  Surgeon: Crist Fat, MD;  Location: [facility];  Service: Urology;  Laterality: N/A;   ROBOTIC ASSISTED LAPAROSCOPIC SACROCOLPOPEXY N/A 04/23/2022   Procedure: XI ROBOTIC ASSISTED LAPAROSCOPIC SACROCOLPOPEXY;  Surgeon: Crist Fat, MD;  Location: [facility];  Service: Urology;  Laterality: N/A;   THYROIDECTOMY  2007   tonsillectomy      There were no vitals filed for this visit.   Objective measurements completed on examination: See above findings.    PCP: Alease Medina, MD  REFERRING PROVIDER: Alease Medina, MD   REFERRING DIAG: [phone] (ICD-10-CM) - Stiffness of left shoulder, not elsewhere classified   THERAPY DIAG:  Stiffness of left shoulder, not elsewhere classified  Chronic left shoulder pain  Rationale for Evaluation and Treatment: Rehabilitation  ONSET DATE:   11/25/22   Date PT referral signed      Rationale for Evaluation and Treatment Rehabilitation  PERTINENT HISTORY: L shoulder stiffness. L shoulder proximal humerus fx June 2023. Fracture has healed. Did PT for 6 months. Wants to try PT again to improve ROM prior to doing surgery. R hand dominant. Stiffness has not changed for the past 6-7 months.   PRECAUTIONS: Pt states she is borderline  osteopenia/osteoporosis.    SUBJECTIVE:   SUBJECTIVE STATEMENT: Did her exercises, thinks she might be improving getting her hand over her head. Reaching up to the side is still challenging. No pain currently.    PAIN:  Are you having pain? See subjective   TODAY'S TREATMENT: DATE: 01/05/23  No latex allergies  No blood pressure problems per pt.   Pt states she is borderline osteopenia/osteoporosis.   Does wall slides for L shoulder flexion AAROM at home  B upper trap muscle tension; L teres major muscle tension   Manual:   Supine with L arm in abduction   A to P to L humeral head grade 3- to 3 to improve joint mobility  Inferior glide L humeral head grade 3- to 3  Supien STM L teres major muscle   Supine with L shoulder in about 90 degrees flexion  Inferior lateral glide grade 3- to 3 to improve mobility   TherEx: standing L shoulder AROM at start of session.              Flexion 104 degrees             Abduction 80 degrees    Supine L shoulder flexion AROM stretch 10x2 with 5 second holds  Supine manually resisted L shoulder ER (PT manually resisted) in about 90 degrees flexion   10x5 seconds for 3 sets   Supine manually resisted L elbow extension isometrics at about 90 degrees shoulder flexion 10x2 with 5 second holds  Supine manually resisted L shoulder flexion 10x  Weakness felt past 90 degree flexion with manual resistance    Improved exercise technique, movement at target joints, use of target muscles after mod verbal, visual, tactile cues.       Response to treatment Pt tolerated session well without aggravation of symptoms.     Clinical impression  Worked on improving L shoulder joint mobility to decrease stiffness and improve ROM. No change in AROM after treatment. Demonstrates weak L shoulder flexor muscles past 90 degrees flexion, limiting AROM. Demonstrates more L shoulder flexion ROM in supine compared to standing, gravity resisted.  Also demonstrates L shoulder ER ROM limitation. Pt tolerated session well without aggravation of symptoms. Pt will benefit from continued skilled physical therapy services to improve L shoulder AROM, strength, and function.      PATIENT EDUCATION: Education details: there-ex, HEP Person educated: Patient Education method: Explanation, Demonstration, Tactile cues, and Verbal cues, handout Education comprehension: verbalized understanding and returned demonstration  HOME EXERCISE PROGRAM: Access Code: [email or web address] URL: [email or web address] Date: 12/06/2022 Prepared by: Loralyn Freshwater  Exercises - Seated Shoulder Inferior Glide  - 3 x daily - 7 x weekly - 3 sets - 10 reps - 5 seconds hold  - Isometric Tricep Extension   - 1 x  daily - 7 x weekly - 3 sets - 10 reps - 5 seconds hold  - Supine Shoulder Flexion Extension Full Range AROM  - 1 x daily - 7 x weekly - 3 sets - 10 reps - 5 seconds hold   - Seated Shoulder Flexion AAROM with Pulley Behind  - 3 x daily - 7 x weekly - 3 sets - 10 reps - 5 seconds hold - Seated Shoulder Abduction AAROM with Pulley Behind  - 3 x daily - 7 x weekly - 3 sets - 10 reps - 5 seconds hold   - Seated Shoulder External Rotation AAROM with Dowel  - 3 x daily - 7 x weekly - 3 sets - 10 reps - 5 seconds hold  - First Rib Mobilization with Strap  - 2 x daily - 7 x weekly - 1 sets - 5 reps - 30 seconds hold      PT Short Term Goals - 12/30/22 1524       PT SHORT TERM GOAL #1   Title Pt will be independent with her initial HEP to improve L shoulder AROM, strength, and function.    Baseline Pt has not yet started her HEP (12/02/2022); doing her exercises, not as often as she should, no questions (12/30/2022)    Time 3    Period Weeks    Status Achieved    Target Date 12/24/22               PT Long Term Goals - 12/02/22 1352       PT LONG TERM GOAL #1   Title Pt will improve L shoulder AROM for flexion and abduction to 145 degrees and  130 degrees respectively to promote ability to reach up and to the side with less difficulty.    Baseline L shoulder AROM for flexion 85 degrees, abduction 72 degrees (12/02/2022)    Time 8    Period Weeks    Status New    Target Date 01/28/23      PT LONG TERM GOAL #2   Title Pt will improve L shoulder functional IR to L thumb to T10 spinous process to promote ability to reach behind her back as well as don and doff clothes with less difficulty.    Baseline L shoulder functional IR: L thumb to L3 spinous process (12/02/2022)    Time 8    Period Weeks    Status New    Target Date 01/28/23      PT LONG TERM GOAL #3   Title Pt will improve L shoulder flexion, abduction, ER, IR strength by at least 1/2 MMT at available range to promote ability to raise her arm up with less difficulty.    Baseline At available range: flexion 4/5, abduction 4/5, ER 4-/5, IR 4/5 (12/02/2022)    Time 8    Period Weeks    Status New    Target Date 01/28/23      PT LONG TERM GOAL #4   Title Pt will improve her L shoulder FOTO score by at least 10 points as a demonstration of improved function.    Baseline L shoulder FOTO 54 (12/02/2022)    Time 8    Period Weeks    Status New    Target Date 01/28/23                    Plan - 01/05/23 1040     Clinical Impression Statement Worked on improving L shoulder  joint mobility to decrease stiffness and improve ROM. No change in AROM after treatment. Demonstrates weak L shoulder flexor muscles past 90 degrees flexion, limiting AROM. Demonstrates more L shoulder flexion ROM in supine compared to standing, gravity resisted. Also demonstrates L shoulder ER ROM limitation. Pt tolerated session well without aggravation of symptoms. Pt will benefit from continued skilled physical therapy services to improve L shoulder AROM, strength, and function.    Personal Factors and Comorbidities Comorbidity 3+;Fitness;Past/Current Experience;Time since onset of  injury/illness/exacerbation    Comorbidities Depression, joint pain, thyroid CA, sleep apnea    Examination-Activity Limitations Reach Overhead;Dressing;Lift    Stability/Clinical Decision Making Stable/Uncomplicated    Rehab Potential Fair    PT Frequency 2x / week    PT Duration 8 weeks    PT Treatment/Interventions Therapeutic activities;Therapeutic exercise;Neuromuscular re-education;Manual techniques;Dry needling;Electrical Stimulation    PT Next Visit Plan scapular, ER, IR strengthening, AAROM, manual techniques, modalities PRN    PT Home Exercise Plan Medbridge Access Code: [email or web address]    Consulted and Agree with Plan of Care Patient               Patient will benefit from skilled therapeutic intervention in order to improve the following deficits and impairments:  Pain, Postural dysfunction, Improper body mechanics, Impaired UE functional use, Decreased strength, Decreased range of motion  Visit Diagnosis: Stiffness of left shoulder, not elsewhere classified  Chronic left shoulder pain     Problem List Patient Active Problem List   Diagnosis Date Noted   Pelvic prolapse 04/23/2022   At risk for activity intolerance 05/20/2020   Crohn's disease with complication (HCC) 04/29/2020   Other hemorrhoids 04/10/2020   At risk for complication associated with hypotension 04/10/2020   Depression 03/20/2020   At risk for osteoporosis 03/20/2020   Prediabetes 03/06/2020   Other specified hypothyroidism 03/06/2020   Absolute anemia 03/06/2020   At risk for diabetes mellitus 03/06/2020   Mood disorder (HCC) 02/21/2020   Vitamin B12 deficiency 02/21/2020   Vitamin D deficiency 02/21/2020   Anemia 02/21/2020   OSA (obstructive sleep apnea) 02/21/2020   Diarrhea 05/12/2018   Other dysphagia 05/12/2018   Rectal bleeding 05/12/2018   Abnormal uterine bleeding 08/04/2017   Stress incontinence 08/20/2015   Vaginal pessary in situ 08/20/2015   Cystocele with small rectocele and  uterine descent 08/20/2015   Cystocele or rectocele with incomplete uterine prolapse 01/29/2015   Hypersomnia with sleep apnea 10/02/2014   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/2014   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   Allergic urticaria 12/25/2012   GERD (gastroesophageal reflux disease) 08/10/2012   Rosacea 05/22/2012   History of dysplastic nevus 05/17/2012   Venous insufficiency of leg 04/24/2012   Obesity (BMI 30-39.9) 04/24/2012   Plantar fasciitis of left foot 12/14/2011   Rotator cuff syndrome of right shoulder 12/14/2011   Hypothyroid 10/21/2011   Asthma, mild intermittent, well-controlled 10/21/2011   Loralyn Freshwater PT, DPT Physical Therapist - [facility]  01/05/23, 4:04 PM   [facility] [address] [place], [address] Phone: [phone]   Fax:  [phone]  Name: Linda Ware MRN: [id number] Date of Birth: [birth year]

## 2023-01-07 ENCOUNTER — Ambulatory Visit: Payer: Medicaid Other

## 2023-01-11 ENCOUNTER — Ambulatory Visit: Payer: Medicaid Other | Attending: Family Medicine

## 2023-01-11 DIAGNOSIS — G8929 Other chronic pain: Secondary | ICD-10-CM | POA: Diagnosis present

## 2023-01-11 DIAGNOSIS — M25512 Pain in left shoulder: Secondary | ICD-10-CM | POA: Insufficient documentation

## 2023-01-11 DIAGNOSIS — M25612 Stiffness of left shoulder, not elsewhere classified: Secondary | ICD-10-CM | POA: Diagnosis present

## 2023-01-11 NOTE — Therapy (Signed)
[facility] [address] [place], [address] Phone: [phone]   Fax:  [phone]  Physical Therapy Treatment  Patient Details  Name: Linda Ware MRN: [id number] Date of Birth: [birth year] Referring Provider (PT): Ziglar, Eli Phillips, MD   Encounter Date: 01/11/2023   PT End of Session - 01/11/23 0819     Visit Number 8    Number of Visits 17    Date for PT Re-Evaluation 01/28/23    PT Start Time 0819    PT Stop Time 0858    PT Time Calculation (min) 39 min    Activity Tolerance Patient tolerated treatment well    Behavior During Therapy [facility] for tasks assessed/performed                   Past Medical History:  Diagnosis Date   Anemia    Asthma, allergic    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 Atypical intraepidermal melanocytic neoplasm with mild to moderate atypia. Excised 03/18/2010, margins free.   B12 deficiency    Chicken pox    Constipation    Crohn's disease (HCC)    Cystocele    Dysplastic nevus 03/26/2008   Left side/waistline. Slight to moderate atypia and halo nevus effect.   Dysplastic nevus 02/03/2010   Left mid side, minimal atypia.   Dysplastic nevus 03/25/2010   Right volar wrist. Mild atypia, edges free.   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 Lurline Del   HERNIA REPAIR     kidney stone removal     x multiple occasions   PUBOVAGINAL SLING N/A 04/23/2022   Procedure: MID URETHRAL SLING, CYSTOSCOPY;  Surgeon: Crist Fat, MD;  Location: [facility];  Service: Urology;  Laterality: N/A;  270 MINUTES NEEDED FOR CASE   ROBOTIC ASSISTED LAPAROSCOPIC HYSTERECTOMY AND SALPINGECTOMY N/A 04/23/2022   Procedure: XI ROBOTIC ASSISTED LAPAROSCOPIC SUPRACERVICAL HYSTERECTOMY AND BILATERAL SALPINGECTOMY;  Surgeon: Crist Fat, MD;  Location: [facility];  Service: Urology;  Laterality: N/A;   ROBOTIC ASSISTED LAPAROSCOPIC SACROCOLPOPEXY N/A 04/23/2022   Procedure: XI ROBOTIC ASSISTED LAPAROSCOPIC SACROCOLPOPEXY;  Surgeon: Crist Fat, MD;  Location: [facility];  Service: Urology;  Laterality: N/A;   THYROIDECTOMY  2007   tonsillectomy      There were no vitals filed for this visit.   Objective measurements completed on examination: See above findings.    PCP: Alease Medina,  MD   REFERRING PROVIDER: Alease Medina, MD   REFERRING DIAG: [phone] (ICD-10-CM) - Stiffness of left shoulder, not elsewhere classified   THERAPY DIAG:  Stiffness of left shoulder, not elsewhere classified  Chronic left shoulder pain  Rationale for Evaluation and Treatment: Rehabilitation  ONSET DATE:   11/25/22   Date PT referral signed      Rationale for Evaluation and Treatment Rehabilitation  PERTINENT HISTORY: L shoulder stiffness. L shoulder proximal humerus fx June 2023. Fracture has healed. Did PT for 6 months. Wants to try PT again to improve ROM prior to doing surgery. R hand dominant. Stiffness has not changed for the past 6-7 months.   PRECAUTIONS: Pt states she is borderline  osteopenia/osteoporosis.    SUBJECTIVE:   SUBJECTIVE STATEMENT: L shoulder is good, no pain, has been doing her stretches, not as much as she should but doing some.   PAIN:  Are you having pain? See subjective   TODAY'S TREATMENT: DATE: 01/11/23  No latex allergies  No blood pressure problems per pt.   Pt states she is borderline osteopenia/osteoporosis.   Does wall slides for L shoulder flexion AAROM at home  B upper trap muscle tension; L teres major muscle tension   Manual:    Supine with L arm in abduction   Inferior glide L humeral head grade 3- to 3  Supine STM L teres major muscle     TherEx:  Manually resisted Supine L shoulder flexion isometrics at end range 10x5 seconds 3 sets  Manually resisted Supine L shoulder abduction isometrics at end range 10x5 seconds for 3 sets  R S/L L shoulder ER 10x3 with 5 seconds   Towel slide up the wall AAROM   Flexion 5x5 seconds for 2 sets  Abduction 5x5 seconds for 2 sets  Seated L first rib stretch 30 seconds x 5  Secondary L shoulder shrug compensation during L shoulder AAROM exercises due to weakness.    Improved exercise technique, movement at target joints, use of target muscles after mod verbal, visual, tactile cues.       Response to treatment Pt tolerated session well without aggravation of symptoms.     Clinical impression  Worked on improving L shoulder joint mobility, decreasing teres major muscle tension and improving end range shoulder flexion and abduction strength to promote ability to raise her arm with less difficulty.  Pt tolerated session well without aggravation of symptoms. Pt will benefit from continued skilled physical therapy services to improve L shoulder AROM, strength, and function.      PATIENT EDUCATION: Education details: there-ex, HEP Person educated: Patient Education method: Explanation, Demonstration, Tactile cues, and Verbal cues, handout Education comprehension:  verbalized understanding and returned demonstration  HOME EXERCISE PROGRAM: Access Code: [email or web address] URL: [email or web address] Date: 12/06/2022 Prepared by: Loralyn Freshwater  Exercises - Seated Shoulder Inferior Glide  - 3 x daily - 7 x weekly - 3 sets - 10 reps - 5 seconds hold  - Isometric Tricep Extension   - 1 x daily - 7 x weekly - 3 sets - 10 reps - 5 seconds hold  - Supine Shoulder Flexion Extension Full Range AROM  - 1 x daily - 7 x weekly - 3 sets - 10 reps - 5 seconds hold   - Seated Shoulder Flexion AAROM with Pulley Behind  - 3 x daily - 7 x weekly - 3 sets - 10 reps - 5 seconds hold - Seated Shoulder Abduction AAROM with Pulley Behind  -  3 x daily - 7 x weekly - 3 sets - 10 reps - 5 seconds hold   - Seated Shoulder External Rotation AAROM with Dowel  - 3 x daily - 7 x weekly - 3 sets - 10 reps - 5 seconds hold  - First Rib Mobilization with Strap  - 2 x daily - 7 x weekly - 1 sets - 5 reps - 30 seconds hold  - Sidelying Shoulder External Rotation AROM  - 1 x daily - 7 x weekly - 3 sets - 10 reps - 5 seconds hold     PT Short Term Goals - 12/30/22 1524       PT SHORT TERM GOAL #1   Title Pt will be independent with her initial HEP to improve L shoulder AROM, strength, and function.    Baseline Pt has not yet started her HEP (12/02/2022); doing her exercises, not as often as she should, no questions (12/30/2022)    Time 3    Period Weeks    Status Achieved    Target Date 12/24/22               PT Long Term Goals - 12/02/22 1352       PT LONG TERM GOAL #1   Title Pt will improve L shoulder AROM for flexion and abduction to 145 degrees and 130 degrees respectively to promote ability to reach up and to the side with less difficulty.    Baseline L shoulder AROM for flexion 85 degrees, abduction 72 degrees (12/02/2022)    Time 8    Period Weeks    Status New    Target Date 01/28/23      PT LONG TERM GOAL #2   Title Pt will improve L shoulder  functional IR to L thumb to T10 spinous process to promote ability to reach behind her back as well as don and doff clothes with less difficulty.    Baseline L shoulder functional IR: L thumb to L3 spinous process (12/02/2022)    Time 8    Period Weeks    Status New    Target Date 01/28/23      PT LONG TERM GOAL #3   Title Pt will improve L shoulder flexion, abduction, ER, IR strength by at least 1/2 MMT at available range to promote ability to raise her arm up with less difficulty.    Baseline At available range: flexion 4/5, abduction 4/5, ER 4-/5, IR 4/5 (12/02/2022)    Time 8    Period Weeks    Status New    Target Date 01/28/23      PT LONG TERM GOAL #4   Title Pt will improve her L shoulder FOTO score by at least 10 points as a demonstration of improved function.    Baseline L shoulder FOTO 54 (12/02/2022)    Time 8    Period Weeks    Status New    Target Date 01/28/23                    Plan - 01/11/23 0818     Clinical Impression Statement Worked on improving L shoulder joint mobility, decreasing teres major muscle tension and improving end range shoulder flexion and abduction strength to promote ability to raise her arm with less difficulty.  Pt tolerated session well without aggravation of symptoms. Pt will benefit from continued skilled physical therapy services to improve L shoulder AROM, strength, and function.    Personal Factors  and Comorbidities Comorbidity 3+;Fitness;Past/Current Experience;Time since onset of injury/illness/exacerbation    Comorbidities Depression, joint pain, thyroid CA, sleep apnea    Examination-Activity Limitations Reach Overhead;Dressing;Lift    Stability/Clinical Decision Making Stable/Uncomplicated    Rehab Potential Fair    PT Frequency 2x / week    PT Duration 8 weeks    PT Treatment/Interventions Therapeutic activities;Therapeutic exercise;Neuromuscular re-education;Manual techniques;Dry needling;Electrical Stimulation    PT Next  Visit Plan scapular, ER, IR strengthening, AAROM, manual techniques, modalities PRN    PT Home Exercise Plan Medbridge Access Code: [email or web address]    Consulted and Agree with Plan of Care Patient               Patient will benefit from skilled therapeutic intervention in order to improve the following deficits and impairments:  Pain, Postural dysfunction, Improper body mechanics, Impaired UE functional use, Decreased strength, Decreased range of motion  Visit Diagnosis: Stiffness of left shoulder, not elsewhere classified  Chronic left shoulder pain     Problem List Patient Active Problem List   Diagnosis Date Noted   Pelvic prolapse 04/23/2022   At risk for activity intolerance 05/20/2020   Crohn's disease with complication (HCC) 04/29/2020   Other hemorrhoids 04/10/2020   At risk for complication associated with hypotension 04/10/2020   Depression 03/20/2020   At risk for osteoporosis 03/20/2020   Prediabetes 03/06/2020   Other specified hypothyroidism 03/06/2020   Absolute anemia 03/06/2020   At risk for diabetes mellitus 03/06/2020   Mood disorder (HCC) 02/21/2020   Vitamin B12 deficiency 02/21/2020   Vitamin D deficiency 02/21/2020   Anemia 02/21/2020   OSA (obstructive sleep apnea) 02/21/2020   Diarrhea 05/12/2018   Other dysphagia 05/12/2018   Rectal bleeding 05/12/2018   Abnormal uterine bleeding 08/04/2017   Stress incontinence 08/20/2015   Vaginal pessary in situ 08/20/2015   Cystocele with small rectocele and uterine descent 08/20/2015   Cystocele or rectocele with incomplete uterine prolapse 01/29/2015   Hypersomnia with sleep apnea 10/02/2014   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/2014   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   Allergic urticaria 12/25/2012   GERD (gastroesophageal reflux disease) 08/10/2012   Rosacea 05/22/2012   History of dysplastic nevus 05/17/2012   Venous insufficiency of leg 04/24/2012   Obesity (BMI 30-39.9)  04/24/2012   Plantar fasciitis of left foot 12/14/2011   Rotator cuff syndrome of right shoulder 12/14/2011   Hypothyroid 10/21/2011   Asthma, mild intermittent, well-controlled 10/21/2011   Loralyn Freshwater PT, DPT Physical Therapist - [place]  [facility]  01/11/23, 12:49 PM   [facility] [address], [place], [address] Phone: [phone]   Fax:  [phone]  Name: GERIANN JOOS MRN: [id number] Date of Birth: 07/25/1970

## 2023-01-17 ENCOUNTER — Ambulatory Visit: Payer: Medicaid Other

## 2023-01-17 ENCOUNTER — Other Ambulatory Visit: Payer: Self-pay | Admitting: *Deleted

## 2023-01-17 MED ORDER — FLUTICASONE-SALMETEROL 250-50 MCG/ACT IN AEPB: 1.0000 | INHALATION_SPRAY | Freq: Two times a day (BID) | RESPIRATORY_TRACT | 5 refills | Status: AC | PRN

## 2023-01-17 NOTE — Telephone Encounter (Signed)
Patient requested refill

## 2023-01-24 ENCOUNTER — Ambulatory Visit: Payer: Medicaid Other

## 2023-01-24 DIAGNOSIS — M25612 Stiffness of left shoulder, not elsewhere classified: Secondary | ICD-10-CM | POA: Diagnosis not present

## 2023-01-24 DIAGNOSIS — G8929 Other chronic pain: Secondary | ICD-10-CM

## 2023-01-24 NOTE — Therapy (Signed)
shoulder flexion, abduction, functional IR AROM, strength, and function since initial evaluation. Continued working on improving L shoulder joint mobility, and strength for flexion and abduction at end ranges to promote ability to raise her arm up higher.  Pt tolerated session well without aggravation of symptoms. Pt will benefit from continued skilled physical therapy services to improve L shoulder AROM, strength, and function.    Personal Factors and Comorbidities Comorbidity 3+;Fitness;Past/Current  Experience;Time since onset of injury/illness/exacerbation    Comorbidities Depression, joint pain, thyroid CA, sleep apnea    Examination-Activity Limitations Reach Overhead;Dressing;Lift    Stability/Clinical Decision Making Stable/Uncomplicated    Rehab Potential Fair    PT Frequency 2x / week    PT Duration 8 weeks    PT Treatment/Interventions Therapeutic activities;Therapeutic exercise;Neuromuscular re-education;Manual techniques;Dry needling;Electrical Stimulation    PT Next Visit Plan scapular, ER, IR strengthening, AAROM, manual techniques, modalities PRN    PT Home Exercise Plan Medbridge Access Code: [email or web address]    Consulted and Agree with Plan of Care Patient                Patient will benefit from skilled therapeutic intervention in order to improve the following deficits and impairments:  Pain, Postural dysfunction, Improper body mechanics, Impaired UE functional use, Decreased strength, Decreased range of motion  Visit Diagnosis: Stiffness of left shoulder, not elsewhere classified  Chronic left shoulder pain     Problem List Patient Active Problem List   Diagnosis Date Noted   Pelvic prolapse 04/23/2022   At risk for activity intolerance 05/20/2020   Crohn's disease with complication (HCC) 04/29/2020   Other hemorrhoids 04/10/2020   At risk for complication associated with hypotension 04/10/2020   Depression 03/20/2020   At risk for osteoporosis 03/20/2020   Prediabetes 03/06/2020   Other specified hypothyroidism 03/06/2020   Absolute anemia 03/06/2020   At risk for diabetes mellitus 03/06/2020   Mood disorder (HCC) 02/21/2020   Vitamin B12 deficiency 02/21/2020   Vitamin D deficiency 02/21/2020   Anemia 02/21/2020   OSA (obstructive sleep apnea) 02/21/2020   Diarrhea 05/12/2018   Other dysphagia 05/12/2018   Rectal bleeding 05/12/2018   Abnormal uterine bleeding 08/04/2017   Stress incontinence 08/20/2015   Vaginal pessary in situ 08/20/2015    Cystocele with small rectocele and uterine descent 08/20/2015   Cystocele or rectocele with incomplete uterine prolapse 01/29/2015   Hypersomnia with sleep apnea 10/02/2014   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/2014   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   Allergic urticaria 12/25/2012   GERD (gastroesophageal reflux disease) 08/10/2012   Rosacea 05/22/2012   History of dysplastic nevus 05/17/2012   Venous insufficiency of leg 04/24/2012   Obesity (BMI 30-39.9) 04/24/2012   Plantar fasciitis of left foot 12/14/2011   Rotator cuff syndrome of right shoulder 12/14/2011   Hypothyroid 10/21/2011   Asthma, mild intermittent, well-controlled 10/21/2011   Loralyn Freshwater PT, DPT Physical Therapist - [facility]  01/24/23, 9:25 AM   [facility] [address] [place], [address] Phone: [phone]   Fax:  [phone]  Name: Linda Ware MRN: [id number] Date of Birth: [birth year]  [facility] [address], [place], [address] Phone: [phone]   Fax:  [phone]  Physical Therapy Treatment  Patient Details  Name: Linda Ware MRN: [id number] Date of Birth: 10/14/1970 Referring Provider (PT): Ziglar, Eli Phillips, MD   Encounter Date: 01/24/2023   PT End of Session - 01/24/23 0818     Visit Number 9    Number of Visits 17    Date for PT Re-Evaluation 01/28/23    PT Start Time 0818    PT Stop Time 0859    PT Time Calculation (min) 41 min    Activity Tolerance Patient tolerated treatment well    Behavior During Therapy [facility] for tasks assessed/performed                    Past Medical History:  Diagnosis Date   Anemia    Asthma, allergic    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 Atypical intraepidermal melanocytic neoplasm with mild to moderate atypia. Excised 03/18/2010, margins free.   B12 deficiency    Chicken pox    Constipation    Crohn's disease (HCC)    Cystocele    Dysplastic nevus 03/26/2008   Left side/waistline. Slight to moderate atypia and halo nevus effect.   Dysplastic nevus 02/03/2010   Left mid side, minimal atypia.   Dysplastic nevus 03/25/2010   Right volar wrist. Mild atypia, edges free.   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 Lurline Del   HERNIA REPAIR     kidney stone removal     x multiple occasions   PUBOVAGINAL SLING N/A 04/23/2022   Procedure: MID URETHRAL SLING, CYSTOSCOPY;  Surgeon: Crist Fat, MD;  Location: [facility];  Service: Urology;  Laterality: N/A;  270 MINUTES NEEDED FOR CASE   ROBOTIC ASSISTED LAPAROSCOPIC HYSTERECTOMY AND SALPINGECTOMY N/A 04/23/2022   Procedure: XI ROBOTIC ASSISTED LAPAROSCOPIC SUPRACERVICAL HYSTERECTOMY AND BILATERAL SALPINGECTOMY;  Surgeon: Crist Fat, MD;  Location: [facility];  Service: Urology;  Laterality: N/A;   ROBOTIC ASSISTED LAPAROSCOPIC SACROCOLPOPEXY N/A 04/23/2022   Procedure: XI ROBOTIC ASSISTED LAPAROSCOPIC SACROCOLPOPEXY;  Surgeon: Crist Fat, MD;  Location: [facility];  Service: Urology;  Laterality: N/A;   THYROIDECTOMY  2007   tonsillectomy      There were no vitals filed for this visit.   Objective measurements completed on examination: See above findings.    PCP: Cheri Kearns  shoulder flexion, abduction, functional IR AROM, strength, and function since initial evaluation. Continued working on improving L shoulder joint mobility, and strength for flexion and abduction at end ranges to promote ability to raise her arm up higher.  Pt tolerated session well without aggravation of symptoms. Pt will benefit from continued skilled physical therapy services to improve L shoulder AROM, strength, and function.    Personal Factors and Comorbidities Comorbidity 3+;Fitness;Past/Current  Experience;Time since onset of injury/illness/exacerbation    Comorbidities Depression, joint pain, thyroid CA, sleep apnea    Examination-Activity Limitations Reach Overhead;Dressing;Lift    Stability/Clinical Decision Making Stable/Uncomplicated    Rehab Potential Fair    PT Frequency 2x / week    PT Duration 8 weeks    PT Treatment/Interventions Therapeutic activities;Therapeutic exercise;Neuromuscular re-education;Manual techniques;Dry needling;Electrical Stimulation    PT Next Visit Plan scapular, ER, IR strengthening, AAROM, manual techniques, modalities PRN    PT Home Exercise Plan Medbridge Access Code: [email or web address]    Consulted and Agree with Plan of Care Patient                Patient will benefit from skilled therapeutic intervention in order to improve the following deficits and impairments:  Pain, Postural dysfunction, Improper body mechanics, Impaired UE functional use, Decreased strength, Decreased range of motion  Visit Diagnosis: Stiffness of left shoulder, not elsewhere classified  Chronic left shoulder pain     Problem List Patient Active Problem List   Diagnosis Date Noted   Pelvic prolapse 04/23/2022   At risk for activity intolerance 05/20/2020   Crohn's disease with complication (HCC) 04/29/2020   Other hemorrhoids 04/10/2020   At risk for complication associated with hypotension 04/10/2020   Depression 03/20/2020   At risk for osteoporosis 03/20/2020   Prediabetes 03/06/2020   Other specified hypothyroidism 03/06/2020   Absolute anemia 03/06/2020   At risk for diabetes mellitus 03/06/2020   Mood disorder (HCC) 02/21/2020   Vitamin B12 deficiency 02/21/2020   Vitamin D deficiency 02/21/2020   Anemia 02/21/2020   OSA (obstructive sleep apnea) 02/21/2020   Diarrhea 05/12/2018   Other dysphagia 05/12/2018   Rectal bleeding 05/12/2018   Abnormal uterine bleeding 08/04/2017   Stress incontinence 08/20/2015   Vaginal pessary in situ 08/20/2015    Cystocele with small rectocele and uterine descent 08/20/2015   Cystocele or rectocele with incomplete uterine prolapse 01/29/2015   Hypersomnia with sleep apnea 10/02/2014   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/2014   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   Allergic urticaria 12/25/2012   GERD (gastroesophageal reflux disease) 08/10/2012   Rosacea 05/22/2012   History of dysplastic nevus 05/17/2012   Venous insufficiency of leg 04/24/2012   Obesity (BMI 30-39.9) 04/24/2012   Plantar fasciitis of left foot 12/14/2011   Rotator cuff syndrome of right shoulder 12/14/2011   Hypothyroid 10/21/2011   Asthma, mild intermittent, well-controlled 10/21/2011   Loralyn Freshwater PT, DPT Physical Therapist - [facility]  01/24/23, 9:25 AM   [facility] [address] [place], [address] Phone: [phone]   Fax:  [phone]  Name: Linda Ware MRN: [id number] Date of Birth: [birth year]  [facility] [address], [place], [address] Phone: [phone]   Fax:  [phone]  Physical Therapy Treatment  Patient Details  Name: Linda Ware MRN: [id number] Date of Birth: 10/14/1970 Referring Provider (PT): Ziglar, Eli Phillips, MD   Encounter Date: 01/24/2023   PT End of Session - 01/24/23 0818     Visit Number 9    Number of Visits 17    Date for PT Re-Evaluation 01/28/23    PT Start Time 0818    PT Stop Time 0859    PT Time Calculation (min) 41 min    Activity Tolerance Patient tolerated treatment well    Behavior During Therapy [facility] for tasks assessed/performed                    Past Medical History:  Diagnosis Date   Anemia    Asthma, allergic    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 Atypical intraepidermal melanocytic neoplasm with mild to moderate atypia. Excised 03/18/2010, margins free.   B12 deficiency    Chicken pox    Constipation    Crohn's disease (HCC)    Cystocele    Dysplastic nevus 03/26/2008   Left side/waistline. Slight to moderate atypia and halo nevus effect.   Dysplastic nevus 02/03/2010   Left mid side, minimal atypia.   Dysplastic nevus 03/25/2010   Right volar wrist. Mild atypia, edges free.   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 Lurline Del   HERNIA REPAIR     kidney stone removal     x multiple occasions   PUBOVAGINAL SLING N/A 04/23/2022   Procedure: MID URETHRAL SLING, CYSTOSCOPY;  Surgeon: Crist Fat, MD;  Location: [facility];  Service: Urology;  Laterality: N/A;  270 MINUTES NEEDED FOR CASE   ROBOTIC ASSISTED LAPAROSCOPIC HYSTERECTOMY AND SALPINGECTOMY N/A 04/23/2022   Procedure: XI ROBOTIC ASSISTED LAPAROSCOPIC SUPRACERVICAL HYSTERECTOMY AND BILATERAL SALPINGECTOMY;  Surgeon: Crist Fat, MD;  Location: [facility];  Service: Urology;  Laterality: N/A;   ROBOTIC ASSISTED LAPAROSCOPIC SACROCOLPOPEXY N/A 04/23/2022   Procedure: XI ROBOTIC ASSISTED LAPAROSCOPIC SACROCOLPOPEXY;  Surgeon: Crist Fat, MD;  Location: [facility];  Service: Urology;  Laterality: N/A;   THYROIDECTOMY  2007   tonsillectomy      There were no vitals filed for this visit.   Objective measurements completed on examination: See above findings.    PCP: Cheri Kearns

## 2023-01-26 ENCOUNTER — Ambulatory Visit: Payer: Medicaid Other

## 2023-01-26 DIAGNOSIS — M25612 Stiffness of left shoulder, not elsewhere classified: Secondary | ICD-10-CM | POA: Diagnosis not present

## 2023-01-26 DIAGNOSIS — G8929 Other chronic pain: Secondary | ICD-10-CM

## 2023-01-26 NOTE — Therapy (Signed)
[facility] [address] [place], [address] Phone: [phone]   Fax:  [phone]  Physical Therapy Treatment And Progress Report (12/02/2022 - 01/26/2023)  Patient Details  Name: GLORIE CHESEBRO MRN: [id number] Date of Birth: 1971-04-08 Referring Provider (PT): Ziglar, Eli Phillips, MD   Encounter Date: 01/26/2023   PT End of Session - 01/26/23 0816     Visit Number 10    Number of Visits 29    Date for PT Re-Evaluation 03/11/23    PT Start Time 0817    PT Stop Time 0859    PT Time Calculation (min) 42 min    Activity Tolerance Patient tolerated treatment well    Behavior During Therapy [facility] for tasks assessed/performed                     Past Medical History:  Diagnosis Date   Anemia    Asthma, allergic    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 Atypical intraepidermal melanocytic neoplasm with mild to moderate atypia. Excised 03/18/2010, margins free.   B12 deficiency    Chicken pox    Constipation    Crohn's disease (HCC)    Cystocele    Dysplastic nevus 03/26/2008   Left side/waistline. Slight to moderate atypia and halo nevus effect.   Dysplastic nevus 02/03/2010   Left mid side, minimal atypia.   Dysplastic nevus 03/25/2010   Right volar wrist. Mild atypia, edges free.   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 Lurline Del   HERNIA REPAIR     kidney stone removal     x multiple occasions   PUBOVAGINAL SLING N/A 04/23/2022   Procedure: MID URETHRAL SLING, CYSTOSCOPY;  Surgeon: Crist Fat, MD;  Location: [facility];  Service: Urology;  Laterality: N/A;  270 MINUTES NEEDED FOR CASE   ROBOTIC ASSISTED LAPAROSCOPIC HYSTERECTOMY AND SALPINGECTOMY N/A 04/23/2022   Procedure: XI ROBOTIC ASSISTED LAPAROSCOPIC SUPRACERVICAL HYSTERECTOMY AND BILATERAL SALPINGECTOMY;  Surgeon: Crist Fat, MD;  Location: [facility];  Service: Urology;  Laterality: N/A;   ROBOTIC ASSISTED LAPAROSCOPIC SACROCOLPOPEXY N/A 04/23/2022   Procedure: XI ROBOTIC ASSISTED LAPAROSCOPIC SACROCOLPOPEXY;  Surgeon: Crist Fat, MD;  Location: [facility];  Service: Urology;  Laterality: N/A;   THYROIDECTOMY  2007   tonsillectomy      There were no vitals filed for this visit.   Objective measurements completed on examination: See above  findings.    PCP: Alease Medina, MD   REFERRING PROVIDER: Alease Medina, MD   REFERRING DIAG: [phone] (ICD-10-CM) - Stiffness of left shoulder, not elsewhere classified   THERAPY DIAG:  Stiffness of left shoulder, not elsewhere classified - Plan: PT plan of care cert/re-cert  Chronic left shoulder pain - Plan: PT plan of care cert/re-cert  Rationale for Evaluation and Treatment: Rehabilitation  ONSET DATE:   11/25/22   Date PT referral signed      Rationale for Evaluation and Treatment Rehabilitation  PERTINENT HISTORY: L shoulder stiffness. L shoulder proximal humerus fx June 2023. Fracture has healed. Did PT for 6 months. Wants to try PT again to improve ROM prior to doing surgery. R  hand dominant. Stiffness has not changed for the past 6-7 months.   PRECAUTIONS: Pt states she is borderline osteopenia/osteoporosis.    SUBJECTIVE:   SUBJECTIVE STATEMENT: L shoulder feels tight like it had a good work out on Monday. No pain.   PAIN:  Are you having pain? See subjective   TODAY'S TREATMENT: DATE: 01/26/23  No latex allergies  No blood pressure problems per pt.   Pt states she is borderline osteopenia/osteoporosis.   Does wall slides for L shoulder flexion AAROM at home  B upper trap muscle tension; L teres major muscle tension   Manual:    Sitting with L arm in about 70 degrees abduction  Caudal glide grade 3     TherEx:   Sitting with L arm in about 70 degrees abduction  L shoulder ER with 1 lbs 10x2    Then 2 lbs 10x, then  6x5 seconds  Seated L shoulder self inferior glide 10x10 seconds for 2 sets  OMEGA machine Standing L shoulder IR plate 5 for 04V Standing L shoulder extension plate 5 for 40J8, then 5x   L Shoulder ER yellow band 10x3    Standing L shoulder adduction red band 10x, then 10x5 seconds for 2 sets  Then in sitting 10x5 seconds   Reviewed POC: another 2x/week for 6 weeks to continue progress.        Improved exercise technique, movement at target joints, use of target muscles after mod verbal, visual, tactile cues.       Response to treatment Pt tolerated session well without aggravation of symptoms.     Clinical impression   Pt demonstrates overall improved L shoulder flexion, abduction, functional IR AROM, strength, and function since initial evaluation. Continued working on improving L shoulder joint mobility, rotator cuff and posterior shoulder strength to promote glenohumeral mechanics when raising her arm.  Pt tolerated session well without aggravation of symptoms. Pt will benefit from continued skilled physical therapy services to improve L shoulder AROM, strength, and function.      PATIENT  EDUCATION: Education details: there-ex, HEP Person educated: Patient Education method: Explanation, Demonstration, Tactile cues, and Verbal cues, handout Education comprehension: verbalized understanding and returned demonstration  HOME EXERCISE PROGRAM: Access Code: [email or web address] URL: [email or web address] Date: 12/06/2022 Prepared by: Loralyn Freshwater  Exercises - Seated Shoulder Inferior Glide  - 3 x daily - 7 x weekly - 3 sets - 10 reps - 5 seconds hold  - Isometric Tricep Extension   - 1 x daily - 7 x weekly - 3 sets - 10 reps - 5 seconds hold  - Supine Shoulder Flexion Extension Full Range AROM  - 1 x daily - 7 x weekly - 3 sets - 10 reps - 5 seconds hold   -  Seated Shoulder Flexion AAROM with Pulley Behind  - 3 x daily - 7 x weekly - 3 sets - 10 reps - 5 seconds hold - Seated Shoulder Abduction AAROM with Pulley Behind  - 3 x daily - 7 x weekly - 3 sets - 10 reps - 5 seconds hold   - Seated Shoulder External Rotation AAROM with Dowel  - 3 x daily - 7 x weekly - 3 sets - 10 reps - 5 seconds hold  - First Rib Mobilization with Strap  - 2 x daily - 7 x weekly - 1 sets - 5 reps - 30 seconds hold  - Sidelying Shoulder External Rotation AROM  - 1 x daily - 7 x weekly - 3 sets - 10 reps - 5 seconds hold  - Shoulder Adduction with Anchored Resistance  - 1 x daily - 7 x weekly - 3 sets - 10 reps - 5 seconds hold  Red band   PT Short Term Goals - 12/30/22 1524       PT SHORT TERM GOAL #1   Title Pt will be independent with her initial HEP to improve L shoulder AROM, strength, and function.    Baseline Pt has not yet started her HEP (12/02/2022); doing her exercises, not as often as she should, no questions (12/30/2022)    Time 3    Period Weeks    Status Achieved    Target Date 12/24/22               PT Long Term Goals - 01/26/23 1101       PT LONG TERM GOAL #1   Title Pt will improve L shoulder AROM for flexion and abduction to 145 degrees and 130 degrees  respectively to promote ability to reach up and to the side with less difficulty.    Baseline L shoulder AROM for flexion 85 degrees, abduction 72 degrees (12/02/2022);  flexion 101 degrees,  abduction 85 degrees (01/24/2023)    Time 6    Period Weeks    Status Partially Met    Target Date 03/11/23      PT LONG TERM GOAL #2   Title Pt will improve L shoulder functional IR to L thumb to T10 spinous process to promote ability to reach behind her back as well as don and doff clothes with less difficulty.    Baseline L shoulder functional IR: L thumb to L3 spinous process (12/02/2022); L thumb to L 1 spoinous process (01/24/2023)    Time 6    Period Weeks    Status Partially Met    Target Date 03/11/23      PT LONG TERM GOAL #3   Title Pt will improve L shoulder flexion, abduction, ER, IR strength by at least 1/2 MMT at available range to promote ability to raise her arm up with less difficulty.    Baseline At available range: flexion 4/5, abduction 4/5, ER 4-/5, IR 4/5 (12/02/2022); flexion 4+/5, abduction 4+/5, ER 4/5, IR 4+/5 (01/24/2023)    Time 8    Period Weeks    Status Achieved    Target Date 01/28/23      PT LONG TERM GOAL #4   Title Pt will improve her L shoulder FOTO score by at least 10 points as a demonstration of improved function.    Baseline L shoulder FOTO 54 (12/02/2022); 61 (01/24/2023)    Time 6    Period Weeks    Status Partially Met    Target  Date 03/11/23                    Plan - 01/26/23 0814     Clinical Impression Statement Pt demonstrates overall improved L shoulder flexion, abduction, functional IR AROM, strength, and function since initial evaluation. Continued working on improving L shoulder joint mobility, rotator cuff and posterior shoulder strength to promote glenohumeral mechanics when raising her arm.  Pt tolerated session well without aggravation of symptoms. Pt will benefit from continued skilled physical therapy services to improve L shoulder  AROM, strength, and function.    Personal Factors and Comorbidities Comorbidity 3+;Fitness;Past/Current Experience;Time since onset of injury/illness/exacerbation    Comorbidities Depression, joint pain, thyroid CA, sleep apnea    Examination-Activity Limitations Reach Overhead;Dressing;Lift    Stability/Clinical Decision Making Stable/Uncomplicated    Rehab Potential Fair    PT Frequency 2x / week    PT Duration 6 weeks    PT Treatment/Interventions Therapeutic activities;Therapeutic exercise;Neuromuscular re-education;Manual techniques;Dry needling;Electrical Stimulation    PT Next Visit Plan scapular, ER, IR strengthening, AAROM, manual techniques, modalities PRN    PT Home Exercise Plan Medbridge Access Code: [email or web address]    Consulted and Agree with Plan of Care Patient                Patient will benefit from skilled therapeutic intervention in order to improve the following deficits and impairments:  Pain, Postural dysfunction, Improper body mechanics, Impaired UE functional use, Decreased strength, Decreased range of motion  Visit Diagnosis: Stiffness of left shoulder, not elsewhere classified - Plan: PT plan of care cert/re-cert  Chronic left shoulder pain - Plan: PT plan of care cert/re-cert     Problem List Patient Active Problem List   Diagnosis Date Noted   Pelvic prolapse 04/23/2022   At risk for activity intolerance 05/20/2020   Crohn's disease with complication (HCC) 04/29/2020   Other hemorrhoids 04/10/2020   At risk for complication associated with hypotension 04/10/2020   Depression 03/20/2020   At risk for osteoporosis 03/20/2020   Prediabetes 03/06/2020   Other specified hypothyroidism 03/06/2020   Absolute anemia 03/06/2020   At risk for diabetes mellitus 03/06/2020   Mood disorder (HCC) 02/21/2020   Vitamin B12 deficiency 02/21/2020   Vitamin D deficiency 02/21/2020   Anemia 02/21/2020   OSA (obstructive sleep apnea) 02/21/2020   Diarrhea  05/12/2018   Other dysphagia 05/12/2018   Rectal bleeding 05/12/2018   Abnormal uterine bleeding 08/04/2017   Stress incontinence 08/20/2015   Vaginal pessary in situ 08/20/2015   Cystocele with small rectocele and uterine descent 08/20/2015   Cystocele or rectocele with incomplete uterine prolapse 01/29/2015   Hypersomnia with sleep apnea 10/02/2014   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/2014   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  Loralyn Freshwater PT, DPT Physical Therapist - [facility]  01/26/23, 11:07 AM     [facility] [address] [place], [address] Phone: [phone]   Fax:  [phone]  Name: GENESHA COLANGELO MRN: [id number] Date of  Birth: [birth year]

## 2023-01-31 ENCOUNTER — Ambulatory Visit: Payer: Medicaid Other

## 2023-01-31 DIAGNOSIS — M25612 Stiffness of left shoulder, not elsewhere classified: Secondary | ICD-10-CM

## 2023-01-31 DIAGNOSIS — G8929 Other chronic pain: Secondary | ICD-10-CM

## 2023-01-31 NOTE — Therapy (Signed)
[facility] [address] [place], [address] Phone: [phone]   Fax:  [phone]  Outpatient Physical Therapy Treatment   Patient Details  Name: Linda Ware MRN: [id number] Date of Birth: 08/07/1970 Referring Provider (PT): Ziglar, Eli Phillips, MD   Encounter Date: 01/31/2023   PT End of Session - 01/31/23 0816     Visit Number 11    Number of Visits 29    Date for PT Re-Evaluation 03/11/23    Authorization Type [place] Medicaid    Progress Note Due on Visit 20    PT Start Time 0815    PT Stop Time 0855    PT Time Calculation (min) 40 min    Equipment Utilized During Treatment Gait belt    Activity Tolerance Patient tolerated treatment well;No increased pain    Behavior During Therapy [facility] for tasks assessed/performed               Past Medical History:  Diagnosis Date   Anemia    Asthma, allergic    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 Atypical intraepidermal melanocytic neoplasm with mild to moderate atypia. Excised 03/18/2010, margins free.   B12 deficiency    Chicken pox    Constipation    Crohn's disease (HCC)    Cystocele    Dysplastic nevus 03/26/2008   Left side/waistline. Slight to moderate atypia and halo nevus effect.   Dysplastic nevus 02/03/2010   Left mid side, minimal atypia.   Dysplastic nevus 03/25/2010   Right volar wrist. Mild atypia, edges free.   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 Lurline Del   HERNIA REPAIR     kidney stone removal     x multiple occasions   PUBOVAGINAL SLING N/A 04/23/2022   Procedure: MID URETHRAL SLING, CYSTOSCOPY;  Surgeon: Crist Fat, MD;  Location: [facility];  Service: Urology;  Laterality: N/A;  270 MINUTES NEEDED FOR CASE   ROBOTIC ASSISTED LAPAROSCOPIC HYSTERECTOMY AND SALPINGECTOMY N/A 04/23/2022   Procedure: XI ROBOTIC ASSISTED LAPAROSCOPIC SUPRACERVICAL HYSTERECTOMY AND BILATERAL SALPINGECTOMY;  Surgeon: Crist Fat, MD;  Location: [facility];  Service: Urology;  Laterality: N/A;   ROBOTIC ASSISTED LAPAROSCOPIC SACROCOLPOPEXY N/A 04/23/2022   Procedure: XI ROBOTIC ASSISTED LAPAROSCOPIC SACROCOLPOPEXY;  Surgeon: Crist Fat, MD;  Location: [facility];  Service: Urology;  Laterality: N/A;   THYROIDECTOMY  2007   tonsillectomy  PT Duration 6 weeks    PT Treatment/Interventions Therapeutic activities;Therapeutic exercise;Neuromuscular re-education;Manual techniques;Dry needling;Electrical Stimulation    PT Next Visit Plan scapular, ER, IR strengthening, AAROM, manual techniques, modalities PRN    PT Home Exercise Plan Medbridge Access Code: [email or web address]     Consulted and Agree with Plan of Care Patient              Patient will benefit from skilled therapeutic intervention in order to improve the following deficits and impairments:  Pain, Postural dysfunction, Improper body mechanics, Impaired UE functional use, Decreased strength, Decreased range of motion  Visit Diagnosis: Stiffness of left shoulder, not elsewhere classified  Chronic left shoulder pain     Problem List Patient Active Problem List   Diagnosis Date Noted   Pelvic prolapse 04/23/2022   At risk for activity intolerance 05/20/2020   Crohn's disease with complication (HCC) 04/29/2020   Other hemorrhoids 04/10/2020   At risk for complication associated with hypotension 04/10/2020   Depression 03/20/2020   At risk for osteoporosis 03/20/2020   Prediabetes 03/06/2020   Other specified hypothyroidism 03/06/2020   Absolute anemia 03/06/2020   At risk for diabetes mellitus 03/06/2020   Mood disorder (HCC) 02/21/2020   Vitamin B12 deficiency 02/21/2020   Vitamin D deficiency 02/21/2020   Anemia 02/21/2020   OSA (obstructive sleep apnea) 02/21/2020   Diarrhea 05/12/2018   Other dysphagia 05/12/2018   Rectal bleeding 05/12/2018   Abnormal uterine bleeding 08/04/2017   Stress incontinence 08/20/2015   Vaginal pessary in situ 08/20/2015   Cystocele with small rectocele and uterine descent 08/20/2015   Cystocele or rectocele with incomplete uterine prolapse 01/29/2015   Hypersomnia with sleep apnea 10/02/2014   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/2014   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  9:19 AM, 01/31/23 Rosamaria Lints, PT, DPT Physical Therapist - [place] [phone] (Office)   [facility] [address], [place], [address] Phone: [phone]   Fax:  [phone]  Name: Linda Ware MRN: [id number] Date of Birth: 01/22/1971  PT Duration 6 weeks    PT Treatment/Interventions Therapeutic activities;Therapeutic exercise;Neuromuscular re-education;Manual techniques;Dry needling;Electrical Stimulation    PT Next Visit Plan scapular, ER, IR strengthening, AAROM, manual techniques, modalities PRN    PT Home Exercise Plan Medbridge Access Code: [email or web address]     Consulted and Agree with Plan of Care Patient              Patient will benefit from skilled therapeutic intervention in order to improve the following deficits and impairments:  Pain, Postural dysfunction, Improper body mechanics, Impaired UE functional use, Decreased strength, Decreased range of motion  Visit Diagnosis: Stiffness of left shoulder, not elsewhere classified  Chronic left shoulder pain     Problem List Patient Active Problem List   Diagnosis Date Noted   Pelvic prolapse 04/23/2022   At risk for activity intolerance 05/20/2020   Crohn's disease with complication (HCC) 04/29/2020   Other hemorrhoids 04/10/2020   At risk for complication associated with hypotension 04/10/2020   Depression 03/20/2020   At risk for osteoporosis 03/20/2020   Prediabetes 03/06/2020   Other specified hypothyroidism 03/06/2020   Absolute anemia 03/06/2020   At risk for diabetes mellitus 03/06/2020   Mood disorder (HCC) 02/21/2020   Vitamin B12 deficiency 02/21/2020   Vitamin D deficiency 02/21/2020   Anemia 02/21/2020   OSA (obstructive sleep apnea) 02/21/2020   Diarrhea 05/12/2018   Other dysphagia 05/12/2018   Rectal bleeding 05/12/2018   Abnormal uterine bleeding 08/04/2017   Stress incontinence 08/20/2015   Vaginal pessary in situ 08/20/2015   Cystocele with small rectocele and uterine descent 08/20/2015   Cystocele or rectocele with incomplete uterine prolapse 01/29/2015   Hypersomnia with sleep apnea 10/02/2014   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/2014   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  9:19 AM, 01/31/23 Rosamaria Lints, PT, DPT Physical Therapist - [place] [phone] (Office)   [facility] [address], [place], [address] Phone: [phone]   Fax:  [phone]  Name: Linda Ware MRN: [id number] Date of Birth: 01/22/1971  PT Duration 6 weeks    PT Treatment/Interventions Therapeutic activities;Therapeutic exercise;Neuromuscular re-education;Manual techniques;Dry needling;Electrical Stimulation    PT Next Visit Plan scapular, ER, IR strengthening, AAROM, manual techniques, modalities PRN    PT Home Exercise Plan Medbridge Access Code: [email or web address]     Consulted and Agree with Plan of Care Patient              Patient will benefit from skilled therapeutic intervention in order to improve the following deficits and impairments:  Pain, Postural dysfunction, Improper body mechanics, Impaired UE functional use, Decreased strength, Decreased range of motion  Visit Diagnosis: Stiffness of left shoulder, not elsewhere classified  Chronic left shoulder pain     Problem List Patient Active Problem List   Diagnosis Date Noted   Pelvic prolapse 04/23/2022   At risk for activity intolerance 05/20/2020   Crohn's disease with complication (HCC) 04/29/2020   Other hemorrhoids 04/10/2020   At risk for complication associated with hypotension 04/10/2020   Depression 03/20/2020   At risk for osteoporosis 03/20/2020   Prediabetes 03/06/2020   Other specified hypothyroidism 03/06/2020   Absolute anemia 03/06/2020   At risk for diabetes mellitus 03/06/2020   Mood disorder (HCC) 02/21/2020   Vitamin B12 deficiency 02/21/2020   Vitamin D deficiency 02/21/2020   Anemia 02/21/2020   OSA (obstructive sleep apnea) 02/21/2020   Diarrhea 05/12/2018   Other dysphagia 05/12/2018   Rectal bleeding 05/12/2018   Abnormal uterine bleeding 08/04/2017   Stress incontinence 08/20/2015   Vaginal pessary in situ 08/20/2015   Cystocele with small rectocele and uterine descent 08/20/2015   Cystocele or rectocele with incomplete uterine prolapse 01/29/2015   Hypersomnia with sleep apnea 10/02/2014   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/2014   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  9:19 AM, 01/31/23 Rosamaria Lints, PT, DPT Physical Therapist - [place] [phone] (Office)   [facility] [address], [place], [address] Phone: [phone]   Fax:  [phone]  Name: Linda Ware MRN: [id number] Date of Birth: 01/22/1971

## 2023-02-02 ENCOUNTER — Other Ambulatory Visit: Payer: Medicaid Other

## 2023-02-02 DIAGNOSIS — D509 Iron deficiency anemia, unspecified: Secondary | ICD-10-CM

## 2023-02-02 DIAGNOSIS — E538 Deficiency of other specified B group vitamins: Secondary | ICD-10-CM

## 2023-02-02 DIAGNOSIS — R7303 Prediabetes: Secondary | ICD-10-CM

## 2023-02-02 DIAGNOSIS — E038 Other specified hypothyroidism: Secondary | ICD-10-CM

## 2023-02-03 LAB — IRON,TIBC AND FERRITIN PANEL
%SAT: 11 % (calc) — ABNORMAL LOW (ref 16–45)
Ferritin: 34 ng/mL (ref 16–232)
Iron: 36 ug/dL — ABNORMAL LOW (ref 45–160)
TIBC: 332 mcg/dL (calc) (ref 250–450)

## 2023-02-03 LAB — COMPLETE METABOLIC PANEL WITH GFR
AG Ratio: 1.5 (calc) (ref 1.0–2.5)
ALT: 14 U/L (ref 6–29)
AST: 15 U/L (ref 10–35)
Albumin: 4.1 g/dL (ref 3.6–5.1)
Alkaline phosphatase (APISO): 95 U/L (ref 37–153)
BUN: 13 mg/dL (ref 7–25)
CO2: 29 mmol/L (ref 20–32)
Calcium: 9.9 mg/dL (ref 8.6–10.4)
Chloride: 105 mmol/L (ref 98–110)
Creat: 0.82 mg/dL (ref 0.50–1.03)
Globulin: 2.8 g/dL (calc) (ref 1.9–3.7)
Glucose, Bld: 98 mg/dL (ref 65–99)
Potassium: 4.3 mmol/L (ref 3.5–5.3)
Sodium: 142 mmol/L (ref 135–146)
Total Bilirubin: 0.6 mg/dL (ref 0.2–1.2)
Total Protein: 6.9 g/dL (ref 6.1–8.1)
eGFR: 87 mL/min/{1.73_m2} (ref >=60)

## 2023-02-03 LAB — CBC WITH DIFFERENTIAL/PLATELET
Absolute Monocytes: 617 cells/uL (ref 200–950)
Basophils Absolute: 39 cells/uL (ref 0–200)
Basophils Relative: 0.4 %
Eosinophils Absolute: 216 cells/uL (ref 15–500)
Eosinophils Relative: 2.2 %
HCT: 38.3 % (ref 35.0–45.0)
Hemoglobin: 11.8 g/dL (ref 11.7–15.5)
Lymphs Abs: 1989 cells/uL (ref 850–3900)
MCH: 24.2 pg — ABNORMAL LOW (ref 27.0–33.0)
MCHC: 30.8 g/dL — ABNORMAL LOW (ref 32.0–36.0)
MCV: 78.6 fL — ABNORMAL LOW (ref 80.0–100.0)
MPV: 10.2 fL (ref 7.5–12.5)
Monocytes Relative: 6.3 %
Neutro Abs: 6938 cells/uL (ref 1500–7800)
Neutrophils Relative %: 70.8 %
Platelets: 304 10*3/uL (ref 140–400)
RBC: 4.87 10*6/uL (ref 3.80–5.10)
RDW: 14.2 % (ref 11.0–15.0)
Total Lymphocyte: 20.3 %
WBC: 9.8 10*3/uL (ref 3.8–10.8)

## 2023-02-03 LAB — VITAMIN B12: Vitamin B-12: 627 pg/mL (ref 200–1100)

## 2023-02-03 LAB — TSH: TSH: 0.02 mIU/L — ABNORMAL LOW

## 2023-02-03 LAB — HEMOGLOBIN A1C
Hgb A1c MFr Bld: 6.1 % of total Hgb — ABNORMAL HIGH (ref <5.7)
Mean Plasma Glucose: 128 mg/dL
eAG (mmol/L): 7.1 mmol/L

## 2023-02-04 ENCOUNTER — Other Ambulatory Visit: Payer: Self-pay | Admitting: Nurse Practitioner

## 2023-02-04 ENCOUNTER — Ambulatory Visit: Payer: Medicaid Other | Admitting: Nurse Practitioner

## 2023-02-04 DIAGNOSIS — D509 Iron deficiency anemia, unspecified: Secondary | ICD-10-CM

## 2023-02-04 DIAGNOSIS — E038 Other specified hypothyroidism: Secondary | ICD-10-CM

## 2023-02-04 MED ORDER — IRON (FERROUS SULFATE) 325 (65 FE) MG PO TABS: 325.0000 mg | ORAL_TABLET | Freq: Every day | ORAL | 3 refills | Status: DC

## 2023-02-04 MED ORDER — LEVOTHYROXINE SODIUM 150 MCG PO TABS: 150.0000 ug | ORAL_TABLET | Freq: Every day | ORAL | 3 refills | Status: DC

## 2023-02-07 ENCOUNTER — Ambulatory Visit: Payer: Medicaid Other

## 2023-02-07 ENCOUNTER — Other Ambulatory Visit: Payer: Self-pay

## 2023-02-07 DIAGNOSIS — G8929 Other chronic pain: Secondary | ICD-10-CM

## 2023-02-07 DIAGNOSIS — E038 Other specified hypothyroidism: Secondary | ICD-10-CM

## 2023-02-07 DIAGNOSIS — D509 Iron deficiency anemia, unspecified: Secondary | ICD-10-CM

## 2023-02-07 DIAGNOSIS — M25612 Stiffness of left shoulder, not elsewhere classified: Secondary | ICD-10-CM

## 2023-02-07 NOTE — Therapy (Signed)
[facility] [address] [place], [address] Phone: [phone]   Fax:  [phone]  Physical Therapy Treatment   Patient Details  Name: Linda Ware MRN: [id number] Date of Birth: March 24, 1971 Referring Provider (PT): Ziglar, Eli Phillips, MD   Encounter Date: 02/07/2023   PT End of Session - 02/07/23 0819     Visit Number 12    Number of Visits 29    Date for PT Re-Evaluation 03/11/23    Authorization Type [place] Medicaid    Progress Note Due on Visit 20    PT Start Time 0819    PT Stop Time 0859    PT Time Calculation (min) 40 min    Equipment Utilized During Treatment Gait belt    Activity Tolerance Patient tolerated treatment well;No increased pain    Behavior During Therapy [facility] for tasks assessed/performed                      Past Medical History:  Diagnosis Date   Anemia    Asthma, allergic    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 Atypical intraepidermal melanocytic neoplasm with mild to moderate atypia. Excised 03/18/2010, margins free.   B12 deficiency    Chicken pox    Constipation    Crohn's disease (HCC)    Cystocele    Dysplastic nevus 03/26/2008   Left side/waistline. Slight to moderate atypia and halo nevus effect.   Dysplastic nevus 02/03/2010   Left mid side, minimal atypia.   Dysplastic nevus 03/25/2010   Right volar wrist. Mild atypia, edges free.   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 Lurline Del   HERNIA REPAIR     kidney stone removal     x multiple occasions   PUBOVAGINAL SLING N/A 04/23/2022   Procedure: MID URETHRAL SLING, CYSTOSCOPY;  Surgeon: Crist Fat, MD;  Location: [facility];  Service: Urology;  Laterality: N/A;  270 MINUTES NEEDED FOR CASE   ROBOTIC ASSISTED LAPAROSCOPIC HYSTERECTOMY AND SALPINGECTOMY N/A 04/23/2022   Procedure: XI ROBOTIC ASSISTED LAPAROSCOPIC SUPRACERVICAL HYSTERECTOMY AND BILATERAL SALPINGECTOMY;  Surgeon: Crist Fat, MD;  Location: [facility];  Service: Urology;  Laterality: N/A;   ROBOTIC ASSISTED LAPAROSCOPIC SACROCOLPOPEXY N/A 04/23/2022   Procedure: XI ROBOTIC ASSISTED LAPAROSCOPIC SACROCOLPOPEXY;  Surgeon: Crist Fat, MD;  Location: [facility];  Service: Urology;  Laterality: N/A;   THYROIDECTOMY  2007  tonsillectomy      There were no vitals filed for this visit.   Objective measurements completed on examination: See above findings.    PCP: Alease Medina, MD   REFERRING PROVIDER: Alease Medina, MD   REFERRING DIAG: [id number] (ICD-10-CM) - Stiffness of left shoulder, not elsewhere classified   THERAPY DIAG:  Stiffness of left shoulder, not elsewhere classified  Chronic left shoulder pain  Rationale for Evaluation and Treatment: Rehabilitation  ONSET DATE:   11/25/22   Date PT referral signed      Rationale for Evaluation and Treatment Rehabilitation  PERTINENT HISTORY: L shoulder stiffness. L shoulder proximal humerus fx June 2023. Fracture has healed. Did PT for 6 months. Wants to try PT again to improve ROM prior  to doing surgery. R hand dominant. Stiffness has not changed for the past 6-7 months.   PRECAUTIONS: Pt states she is borderline osteopenia/osteoporosis.    SUBJECTIVE:   SUBJECTIVE STATEMENT: L shoulder feels great. Thinks the exercises at the machine are helping.   PAIN:  Are you having pain? See subjective   TODAY'S TREATMENT: DATE: 02/07/23  No latex allergies  No blood pressure problems per pt.   Pt states she is borderline osteopenia/osteoporosis.   Does wall slides for L shoulder flexion AAROM at home  B upper trap muscle tension; L teres major muscle tension    TherEx: OMEGA machine Standing L shoulder IR plate 5 for 98J1 Standing L shoulder extension plate 5 with 3 lbs ankle weight for 10x2 -cable row standing plate 25 for 91Y7  -cable lat pull plate 20 [id number]  Standing L shoulder ER red band 10x3   TRX rows 10x3  Sitting with L arm propped in about 70 degrees abduction  L shoulder ER with 2 lbs 10x5 seconds for 2 sets   Standing L shoulder adduction red band 10x5 seconds for 3 sets  Standing L shoulder ER stretch at doorway 10x10 seconds for 2 sets. Improved ROM observed.    Standing wall towel slides   Flexion 10x5 seconds    Then with step backs while pt holds end range flexion position 5x5 seconds for 2 sets with contralateral UE assist PRN     Improved exercise technique, movement at target joints, use of target muscles after mod verbal, visual, tactile cues.       Response to treatment Pt tolerated session well without aggravation of symptoms.     Clinical impression  Focused on strengthening today to improve function at available range. Continued with ER and shoulder flexion AROM to promote ability to reach further with her L arm. Improving ER AAROM observed during exercise today. Good muscle use felt with exercises as well. Pt tolerated session well without aggravation of symptoms. Pt will benefit from continued skilled physical  therapy services to improve L shoulder AROM, strength, and function.      PATIENT EDUCATION: Education details: there-ex, HEP Person educated: Patient Education method: Explanation, Demonstration, Tactile cues, and Verbal cues, handout Education comprehension: verbalized understanding and returned demonstration  HOME EXERCISE PROGRAM: Access Code: [email or web address] URL: [email or web address] Date: 12/06/2022 Prepared by: Loralyn Freshwater  Exercises - Seated Shoulder Inferior Glide  - 3 x daily - 7 x weekly - 3 sets - 10 reps - 5 seconds hold  - Isometric Tricep Extension   - 1 x daily - 7 x weekly - 3 sets - 10 reps - 5 seconds hold  - Supine Shoulder Flexion Extension Full Range AROM  - 1 x daily -  7 x weekly - 3 sets - 10 reps - 5 seconds hold   - Seated Shoulder Flexion AAROM with Pulley Behind  - 3 x daily - 7 x weekly - 3 sets - 10 reps - 5 seconds hold - Seated Shoulder Abduction AAROM with Pulley Behind  - 3 x daily - 7 x weekly - 3 sets - 10 reps - 5 seconds hold   - Seated Shoulder External Rotation AAROM with Dowel  - 3 x daily - 7 x weekly - 3 sets - 10 reps - 5 seconds hold  - First Rib Mobilization with Strap  - 2 x daily - 7 x weekly - 1 sets - 5 reps - 30 seconds hold  - Sidelying Shoulder External Rotation AROM  - 1 x daily - 7 x weekly - 3 sets - 10 reps - 5 seconds hold  - Shoulder Adduction with Anchored Resistance  - 1 x daily - 7 x weekly - 3 sets - 10 reps - 5 seconds hold  Red band     PT Short Term Goals - 12/30/22 1524       PT SHORT TERM GOAL #1   Title Pt will be independent with her initial HEP to improve L shoulder AROM, strength, and function.    Baseline Pt has not yet started her HEP (12/02/2022); doing her exercises, not as often as she should, no questions (12/30/2022)    Time 3    Period Weeks    Status Achieved    Target Date 12/24/22               PT Long Term Goals - 01/26/23 1101       PT LONG TERM GOAL #1   Title Pt  will improve L shoulder AROM for flexion and abduction to 145 degrees and 130 degrees respectively to promote ability to reach up and to the side with less difficulty.    Baseline L shoulder AROM for flexion 85 degrees, abduction 72 degrees (12/02/2022);  flexion 101 degrees,  abduction 85 degrees (01/24/2023)    Time 6    Period Weeks    Status Partially Met    Target Date 03/11/23      PT LONG TERM GOAL #2   Title Pt will improve L shoulder functional IR to L thumb to T10 spinous process to promote ability to reach behind her back as well as don and doff clothes with less difficulty.    Baseline L shoulder functional IR: L thumb to L3 spinous process (12/02/2022); L thumb to L 1 spoinous process (01/24/2023)    Time 6    Period Weeks    Status Partially Met    Target Date 03/11/23      PT LONG TERM GOAL #3   Title Pt will improve L shoulder flexion, abduction, ER, IR strength by at least 1/2 MMT at available range to promote ability to raise her arm up with less difficulty.    Baseline At available range: flexion 4/5, abduction 4/5, ER 4-/5, IR 4/5 (12/02/2022); flexion 4+/5, abduction 4+/5, ER 4/5, IR 4+/5 (01/24/2023)    Time 8    Period Weeks    Status Achieved    Target Date 01/28/23      PT LONG TERM GOAL #4   Title Pt will improve her L shoulder FOTO score by at least 10 points as a demonstration of improved function.    Baseline L shoulder FOTO 54 (12/02/2022); 61 (01/24/2023)  Time 6    Period Weeks    Status Partially Met    Target Date 03/11/23                    Plan - 02/07/23 0816     Clinical Impression Statement Focused on strengthening today to improve function at available range. Continued with ER and shoulder flexion AROM to promote ability to reach further with her L arm. Improving ER AAROM observed during exercise today. Good muscle use felt with exercises as well. Pt tolerated session well without aggravation of symptoms. Pt will benefit from continued  skilled physical therapy services to improve L shoulder AROM, strength, and function.    Personal Factors and Comorbidities Comorbidity 3+;Fitness;Past/Current Experience;Time since onset of injury/illness/exacerbation    Comorbidities Depression, joint pain, thyroid CA, sleep apnea    Examination-Activity Limitations Reach Overhead;Dressing;Lift    Stability/Clinical Decision Making Stable/Uncomplicated    Rehab Potential Fair    PT Frequency 2x / week    PT Duration 6 weeks    PT Treatment/Interventions Therapeutic activities;Therapeutic exercise;Neuromuscular re-education;Manual techniques;Dry needling;Electrical Stimulation    PT Next Visit Plan scapular, ER, IR strengthening, AAROM, manual techniques, modalities PRN    PT Home Exercise Plan Medbridge Access Code: [email or web address]    Consulted and Agree with Plan of Care Patient                Patient will benefit from skilled therapeutic intervention in order to improve the following deficits and impairments:  Pain, Postural dysfunction, Improper body mechanics, Impaired UE functional use, Decreased strength, Decreased range of motion  Visit Diagnosis: Stiffness of left shoulder, not elsewhere classified  Chronic left shoulder pain     Problem List Patient Active Problem List   Diagnosis Date Noted   Pelvic prolapse 04/23/2022   At risk for activity intolerance 05/20/2020   Crohn's disease with complication (HCC) 04/29/2020   Other hemorrhoids 04/10/2020   At risk for complication associated with hypotension 04/10/2020   Depression 03/20/2020   At risk for osteoporosis 03/20/2020   Prediabetes 03/06/2020   Other specified hypothyroidism 03/06/2020   Absolute anemia 03/06/2020   At risk for diabetes mellitus 03/06/2020   Mood disorder (HCC) 02/21/2020   Vitamin B12 deficiency 02/21/2020   Vitamin D deficiency 02/21/2020   Anemia 02/21/2020   OSA (obstructive sleep apnea) 02/21/2020   Diarrhea 05/12/2018   Other  dysphagia 05/12/2018   Rectal bleeding 05/12/2018   Abnormal uterine bleeding 08/04/2017   Stress incontinence 08/20/2015   Vaginal pessary in situ 08/20/2015   Cystocele with small rectocele and uterine descent 08/20/2015   Cystocele or rectocele with incomplete uterine prolapse 01/29/2015   Hypersomnia with sleep apnea 10/02/2014   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/2014   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 [place], [address] Phone: [phone]   Fax:  [phone]  Name: Linda Ware MRN: [id number] Date of  Birth: [birth year]

## 2023-02-11 ENCOUNTER — Encounter: Payer: Self-pay | Admitting: Nurse Practitioner

## 2023-02-11 NOTE — Telephone Encounter (Signed)
Message routed to PCP Eubanks, Jessica K, NP  

## 2023-02-17 ENCOUNTER — Ambulatory Visit: Payer: Medicaid Other

## 2023-02-24 ENCOUNTER — Ambulatory Visit: Payer: Medicaid Other | Attending: Family Medicine

## 2023-02-24 DIAGNOSIS — M25612 Stiffness of left shoulder, not elsewhere classified: Secondary | ICD-10-CM | POA: Insufficient documentation

## 2023-02-24 DIAGNOSIS — G8929 Other chronic pain: Secondary | ICD-10-CM | POA: Diagnosis present

## 2023-02-24 DIAGNOSIS — M25512 Pain in left shoulder: Secondary | ICD-10-CM | POA: Insufficient documentation

## 2023-02-24 NOTE — Therapy (Signed)
[facility] [address] [place], [address] Phone: [phone]   Fax:  [phone]  Physical Therapy Treatment   Patient Details  Name: Linda Ware MRN: [id number] Date of Birth: June 03, 1970 Referring Provider (PT): Ziglar, Eli Phillips, MD   Encounter Date: 02/24/2023   PT End of Session - 02/24/23 0734     Visit Number 13    Number of Visits 29    Date for PT Re-Evaluation 03/11/23    Authorization Type [place] Medicaid    Progress Note Due on Visit 20    PT Start Time 0734    PT Stop Time 0814    PT Time Calculation (min) 40 min    Activity Tolerance Patient tolerated treatment well;No increased pain    Behavior During Therapy [facility] for tasks assessed/performed                       Past Medical History:  Diagnosis Date   Anemia    Asthma, allergic    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 Atypical intraepidermal melanocytic neoplasm with mild to moderate atypia. Excised 03/18/2010, margins free.   B12 deficiency    Chicken pox    Constipation    Crohn's disease (HCC)    Cystocele    Dysplastic nevus 03/26/2008   Left side/waistline. Slight to moderate atypia and halo nevus effect.   Dysplastic nevus 02/03/2010   Left mid side, minimal atypia.   Dysplastic nevus 03/25/2010   Right volar wrist. Mild atypia, edges free.   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 Lurline Del   HERNIA REPAIR     kidney stone removal     x multiple occasions   PUBOVAGINAL SLING N/A 04/23/2022   Procedure: MID URETHRAL SLING, CYSTOSCOPY;  Surgeon: Crist Fat, MD;  Location: [facility];  Service: Urology;  Laterality: N/A;  270 MINUTES NEEDED FOR CASE   ROBOTIC ASSISTED LAPAROSCOPIC HYSTERECTOMY AND SALPINGECTOMY N/A 04/23/2022   Procedure: XI ROBOTIC ASSISTED LAPAROSCOPIC SUPRACERVICAL HYSTERECTOMY AND BILATERAL SALPINGECTOMY;  Surgeon: Crist Fat, MD;  Location: [facility];  Service: Urology;  Laterality: N/A;   ROBOTIC ASSISTED LAPAROSCOPIC SACROCOLPOPEXY N/A 04/23/2022   Procedure: XI ROBOTIC ASSISTED LAPAROSCOPIC SACROCOLPOPEXY;  Surgeon: Crist Fat, MD;  Location: [facility];  Service: Urology;  Laterality: N/A;   THYROIDECTOMY  2007   tonsillectomy      There were  no vitals filed for this visit.   Objective measurements completed on examination: See above findings.    PCP: Alease Medina, MD   REFERRING PROVIDER: Alease Medina, MD   REFERRING DIAG: [phone] (ICD-10-CM) - Stiffness of left shoulder, not elsewhere classified   THERAPY DIAG:  Stiffness of left shoulder, not elsewhere classified  Chronic left shoulder pain  Rationale for Evaluation and Treatment: Rehabilitation  ONSET DATE:   11/25/22   Date PT referral signed      Rationale for Evaluation and Treatment Rehabilitation  PERTINENT HISTORY: L shoulder stiffness. L shoulder proximal humerus fx June 2023. Fracture has healed. Did PT for 6 months. Wants to try PT again to improve ROM prior to doing surgery. R hand dominant. Stiffness has  not changed for the past 6-7 months.   PRECAUTIONS: Pt states she is borderline osteopenia/osteoporosis.    SUBJECTIVE:   SUBJECTIVE STATEMENT: Has soreness L lateral neck and shoulder after trying to put her L hand on her head yesterday. 2/10 L anterior shoulder pain when lifting her L arm up to the side.   PAIN:  Are you having pain? See subjective   TODAY'S TREATMENT: DATE: 02/24/23  No latex allergies  No blood pressure problems per pt.   Pt states she is borderline osteopenia/osteoporosis.   Does wall slides for L shoulder flexion AAROM at home  B upper trap muscle tension; L teres major muscle tension      TherEx:  Seated L shoulder self inferior glide 10x10 seconds for 2 sets  Standing L shoulder extension green band 10x5 seconds   Then with green and red band 10x5 seconds for 2 sets  Standing L pectoralis doorway stretch 30 seconds x 3  Standing L shoulder ER at doorway 30 seconds x 2  Supine L shoulder ER stretch with PT with L arm in abduction 10x5 seconds   Supine L shoulder ER stretch with arm in 90 degrees abduction with dowel 10x5 seconds       Improved exercise technique, movement at target joints, use of target muscles after mod verbal, visual, tactile cues.    Manual therapy   Supine inferior glide grade 3 to promote mobility and decrease stiffness      Response to treatment Pt tolerated session well without aggravation of symptoms.     Clinical impression  Worked on improving L shoulder inferior joint mobility as well as ER ROM to promote better glenohumeral movement when raising her arm. Decreased L shoulder pain with raising her arm reported after session.  Pt will benefit from continued skilled physical therapy services to improve L shoulder AROM, strength, and function.        PATIENT EDUCATION: Education details: there-ex, HEP Person educated: Patient Education method: Explanation, Demonstration, Tactile cues, and  Verbal cues, handout Education comprehension: verbalized understanding and returned demonstration  HOME EXERCISE PROGRAM: Access Code: [email or web address] URL: [email or web address] Date: 12/06/2022 Prepared by: Loralyn Freshwater  Exercises - Seated Shoulder Inferior Glide  - 3 x daily - 7 x weekly - 3 sets - 10 reps - 5 seconds hold  - Isometric Tricep Extension   - 1 x daily - 7 x weekly - 3 sets - 10 reps - 5 seconds hold  - Supine Shoulder Flexion Extension Full Range AROM  - 1 x daily - 7 x weekly - 3 sets - 10 reps - 5 seconds hold   - Seated Shoulder Flexion AAROM with Pulley Behind  - 3 x daily - 7 x  weekly - 3 sets - 10 reps - 5 seconds hold - Seated Shoulder Abduction AAROM with Pulley Behind  - 3 x daily - 7 x weekly - 3 sets - 10 reps - 5 seconds hold   - Seated Shoulder External Rotation AAROM with Dowel  - 3 x daily - 7 x weekly - 3 sets - 10 reps - 5 seconds hold  - First Rib Mobilization with Strap  - 2 x daily - 7 x weekly - 1 sets - 5 reps - 30 seconds hold  - Sidelying Shoulder External Rotation AROM  - 1 x daily - 7 x weekly - 3 sets - 10 reps - 5 seconds hold  - Shoulder Adduction with Anchored Resistance  - 1 x daily - 7 x weekly - 3 sets - 10 reps - 5 seconds hold  Red band  - Supine Shoulder External Rotation with Dowel  - 1 x daily - 7 x weekly - 3 sets - 10 reps - 5 seconds hold      PT Short Term Goals - 12/30/22 1524       PT SHORT TERM GOAL #1   Title Pt will be independent with her initial HEP to improve L shoulder AROM, strength, and function.    Baseline Pt has not yet started her HEP (12/02/2022); doing her exercises, not as often as she should, no questions (12/30/2022)    Time 3    Period Weeks    Status Achieved    Target Date 12/24/22               PT Long Term Goals - 01/26/23 1101       PT LONG TERM GOAL #1   Title Pt will improve L shoulder AROM for flexion and abduction to 145 degrees and 130 degrees respectively to promote  ability to reach up and to the side with less difficulty.    Baseline L shoulder AROM for flexion 85 degrees, abduction 72 degrees (12/02/2022);  flexion 101 degrees,  abduction 85 degrees (01/24/2023)    Time 6    Period Weeks    Status Partially Met    Target Date 03/11/23      PT LONG TERM GOAL #2   Title Pt will improve L shoulder functional IR to L thumb to T10 spinous process to promote ability to reach behind her back as well as don and doff clothes with less difficulty.    Baseline L shoulder functional IR: L thumb to L3 spinous process (12/02/2022); L thumb to L 1 spoinous process (01/24/2023)    Time 6    Period Weeks    Status Partially Met    Target Date 03/11/23      PT LONG TERM GOAL #3   Title Pt will improve L shoulder flexion, abduction, ER, IR strength by at least 1/2 MMT at available range to promote ability to raise her arm up with less difficulty.    Baseline At available range: flexion 4/5, abduction 4/5, ER 4-/5, IR 4/5 (12/02/2022); flexion 4+/5, abduction 4+/5, ER 4/5, IR 4+/5 (01/24/2023)    Time 8    Period Weeks    Status Achieved    Target Date 01/28/23      PT LONG TERM GOAL #4   Title Pt will improve her L shoulder FOTO score by at least 10 points as a demonstration of improved function.    Baseline L shoulder FOTO 54 (12/02/2022); 61 (01/24/2023)    Time 6  Period Weeks    Status Partially Met    Target Date 03/11/23                    Plan - 02/24/23 1620     Clinical Impression Statement Worked on improving L shoulder inferior joint mobility as well as ER ROM to promote better glenohumeral movement when raising her arm. Decreased L shoulder pain with raising her arm reported after session.  Pt will benefit from continued skilled physical therapy services to improve L shoulder AROM, strength, and function.    Personal Factors and Comorbidities Comorbidity 3+;Fitness;Past/Current Experience;Time since onset of injury/illness/exacerbation     Comorbidities Depression, joint pain, thyroid CA, sleep apnea    Examination-Activity Limitations Reach Overhead;Dressing;Lift    Stability/Clinical Decision Making Stable/Uncomplicated    Rehab Potential Fair    PT Frequency 2x / week    PT Duration 6 weeks    PT Treatment/Interventions Therapeutic activities;Therapeutic exercise;Neuromuscular re-education;Manual techniques;Dry needling;Electrical Stimulation    PT Next Visit Plan scapular, ER, IR strengthening, AAROM, manual techniques, modalities PRN    PT Home Exercise Plan Medbridge Access Code: [email or web address]    Consulted and Agree with Plan of Care Patient                 Patient will benefit from skilled therapeutic intervention in order to improve the following deficits and impairments:  Pain, Postural dysfunction, Improper body mechanics, Impaired UE functional use, Decreased strength, Decreased range of motion  Visit Diagnosis: Stiffness of left shoulder, not elsewhere classified  Chronic left shoulder pain     Problem List Patient Active Problem List   Diagnosis Date Noted   Pelvic prolapse 04/23/2022   At risk for activity intolerance 05/20/2020   Crohn's disease with complication (HCC) 04/29/2020   Other hemorrhoids 04/10/2020   At risk for complication associated with hypotension 04/10/2020   Depression 03/20/2020   At risk for osteoporosis 03/20/2020   Prediabetes 03/06/2020   Other specified hypothyroidism 03/06/2020   Absolute anemia 03/06/2020   At risk for diabetes mellitus 03/06/2020   Mood disorder (HCC) 02/21/2020   Vitamin B12 deficiency 02/21/2020   Vitamin D deficiency 02/21/2020   Anemia 02/21/2020   OSA (obstructive sleep apnea) 02/21/2020   Diarrhea 05/12/2018   Other dysphagia 05/12/2018   Rectal bleeding 05/12/2018   Abnormal uterine bleeding 08/04/2017   Stress incontinence 08/20/2015   Vaginal pessary in situ 08/20/2015   Cystocele with small rectocele and uterine descent 08/20/2015    Cystocele or rectocele with incomplete uterine prolapse 01/29/2015   Hypersomnia with sleep apnea 10/02/2014   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/2014   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   Allergic urticaria 12/25/2012   GERD (gastroesophageal reflux disease) 08/10/2012   Rosacea 05/22/2012   History of dysplastic nevus 05/17/2012   Venous insufficiency of leg 04/24/2012   Obesity (BMI 30-39.9) 04/24/2012   Plantar fasciitis of left foot 12/14/2011   Rotator cuff syndrome of right shoulder 12/14/2011   Hypothyroid 10/21/2011   Asthma, mild intermittent, well-controlled 10/21/2011       Loralyn Freshwater PT, DPT Physical Therapist - [facility]  02/24/23, 4:23 PM   [facility] [address], [place], [address] Phone: [phone]   Fax:  [phone]  Name: Linda Ware MRN: [id number] Date of Birth: [birth year]

## 2023-02-25 ENCOUNTER — Encounter: Payer: Self-pay | Admitting: Nurse Practitioner

## 2023-02-25 ENCOUNTER — Telehealth (INDEPENDENT_AMBULATORY_CARE_PROVIDER_SITE_OTHER): Payer: Medicaid Other | Admitting: Nurse Practitioner

## 2023-02-25 DIAGNOSIS — K219 Gastro-esophageal reflux disease without esophagitis: Secondary | ICD-10-CM

## 2023-02-25 DIAGNOSIS — E038 Other specified hypothyroidism: Secondary | ICD-10-CM

## 2023-02-25 MED ORDER — PANTOPRAZOLE SODIUM 40 MG PO TBEC: 40.0000 mg | DELAYED_RELEASE_TABLET | Freq: Every day | ORAL | 3 refills | Status: DC

## 2023-02-25 NOTE — Telephone Encounter (Signed)
Message routed to PCP Eubanks, Jessica K, NP  

## 2023-02-25 NOTE — Progress Notes (Signed)
Careteam: Patient Care Team: Sharon Seller, NP as PCP - General (Geriatric Medicine) Jim Like, RN as Registered Nurse Scarlett Presto, RN (Inactive) as Registered Nurse Crist Fat, MD as Attending Physician (Urology)  Advanced Directive information    Allergies  Allergen Reactions   Clindamycin/Lincomycin Rash    Chief Complaint  Patient presents with   Acute Visit    Patient reports acid reflux flare up. She reports burping and gas mainly after eating.     HPI: Patient is a 52 y.o. female due to worsening GERD She was happy with Pepcid but symptoms are not controlled with medication currently.  Using famotidine 20 mg twice daily and tums as needed Really does not feel like she eats a lot of food that contributes to acid. Reports burning in throat  throughout the day and when sleeping.  She has a lot of bloating and gases after meal.   Trying to stay away from wheat/gluten.   Review of Systems:  Review of Systems  Gastrointestinal:  Positive for heartburn. Negative for blood in stool, constipation and diarrhea.    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 Atypical intraepidermal melanocytic neoplasm with mild to moderate atypia. Excised 03/18/2010, margins free.   B12 deficiency    Chicken pox    Constipation    Crohn's disease (HCC)    Cystocele    Dysplastic nevus 03/26/2008   Left side/waistline. Slight to moderate atypia and halo nevus effect.   Dysplastic nevus 02/03/2010   Left mid side, minimal atypia.   Dysplastic nevus 03/25/2010   Right volar wrist. Mild atypia, edges free.   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 Lurline Del   HERNIA REPAIR     kidney stone removal     x multiple occasions   PUBOVAGINAL SLING N/A 04/23/2022   Procedure: MID URETHRAL SLING, CYSTOSCOPY;  Surgeon: Crist Fat, MD;  Location: [facility];  Service: Urology;  Laterality: N/A;  270 MINUTES NEEDED FOR CASE   ROBOTIC ASSISTED LAPAROSCOPIC HYSTERECTOMY AND SALPINGECTOMY N/A 04/23/2022   Procedure: XI ROBOTIC ASSISTED LAPAROSCOPIC SUPRACERVICAL HYSTERECTOMY AND BILATERAL SALPINGECTOMY;  Surgeon: Crist Fat, MD;  Location: [facility];  Service: Urology;  Laterality: N/A;   ROBOTIC ASSISTED LAPAROSCOPIC SACROCOLPOPEXY N/A 04/23/2022   Procedure: XI ROBOTIC ASSISTED LAPAROSCOPIC SACROCOLPOPEXY;  Surgeon: Crist Fat,  MD;  Location: [facility];  Service: Urology;  Laterality: N/A;   THYROIDECTOMY  2007   tonsillectomy     Social History:   reports that she quit smoking about 29 years ago. Her smoking use included cigarettes. She has never used smokeless tobacco. She reports that she does not currently use alcohol. She reports that she does not use drugs.  Family History  Problem Relation Age of Onset   Asthma Mother    Hyperlipidemia Mother    Depression Mother    Hypothyroidism Mother    Obesity Mother    Hyperthyroidism Father    Depression Father    Early death Father    Hypothyroidism  Father    Bipolar disorder Father    Depression Sister    Celiac disease Sister        questionable   Stroke Maternal Uncle    Diabetes type II Paternal Aunt    Hypertension Paternal Aunt    Heart attack Paternal Uncle    Diabetes type II Paternal Uncle    Hypertension Paternal Uncle    Asthma Maternal Grandmother    Dementia Maternal Grandmother    Hypertension Maternal Grandmother    Multiple myeloma Maternal Grandmother    Heart disease Maternal Grandmother    Dementia Maternal Grandfather    Melanoma Maternal Grandfather    Heart attack Maternal Grandfather    Diabetes type II Maternal Grandfather    Diabetes Maternal Grandfather    Heart disease Maternal Grandfather    Breast cancer Paternal Grandmother    Hyperlipidemia Paternal Grandmother    Hypothyroidism Paternal Grandmother    Inflammatory bowel disease Paternal Grandmother    Hyperlipidemia Paternal Grandfather    Hypertension Paternal Grandfather    Hypothyroidism Paternal Grandfather    Pancreatic cancer Paternal Grandfather    Cancer Paternal Grandfather    Colon cancer Cousin    Diabetes type II Cousin    Kidney disease Other    COPD Neg Hx     Medications: Patient's Medications  New Prescriptions   No medications on file  Previous Medications   ALBUTEROL (VENTOLIN HFA) 108 (90 BASE) MCG/ACT INHALER    Inhale 1-2 puffs into the lungs every 6 (six) hours as needed for wheezing or shortness of breath.   FAMOTIDINE (PEPCID) 20 MG TABLET    Take 20 mg by mouth at bedtime.   FLUTICASONE-SALMETEROL (ADVAIR) 250-50 MCG/ACT AEPB    Inhale 1 puff into the lungs 2 (two) times daily as needed (shortness of breath).   GABAPENTIN (NEURONTIN) 300 MG CAPSULE    Take 1 capsule (300 mg total) by mouth at bedtime.   IRON, FERROUS SULFATE, 325 (65 FE) MG TABS    Take 325 mg by mouth daily.   LEVOTHYROXINE (SYNTHROID) 150 MCG TABLET    Take 1 tablet (150 mcg total) by mouth daily.  Modified Medications   No medications on  file  Discontinued Medications   No medications on file    Physical Exam:  There were no vitals filed for this visit. There is no height or weight on file to calculate BMI. Wt Readings from Last 3 Encounters:  01/03/23 267 lb (121.1 kg)  04/23/22 247 lb 15.9 oz (112.5 kg)  04/06/22 248 lb (112.5 kg)    Physical Exam Constitutional:      Appearance: Normal appearance.  Pulmonary:     Effort: Pulmonary effort is normal.  Neurological:     Mental Status: She is alert. Mental status is at baseline.  Psychiatric:        Mood and Affect: Mood normal.     Labs reviewed: Basic Metabolic Panel: Recent Labs    04/06/22 0943 04/24/22 0650 02/02/23 0809  NA 140 141 142  K 4.0 4.0 4.3  CL 106 109 105  CO2 25 27 29   GLUCOSE 99 98 98  BUN 17 9 13   CREATININE 0.87 0.80 0.82  CALCIUM 9.4 8.6* 9.9  TSH  --   --  0.02*   Liver Function Tests: Recent Labs    02/02/23 0809  AST 15  ALT 14  BILITOT 0.6  PROT 6.9   No results for input(s): "LIPASE", "AMYLASE" in the last 8760 hours. No results for input(s): "AMMONIA" in the last 8760 hours. CBC: Recent Labs    04/06/22 0943 04/24/22 0650 02/02/23 0809  WBC 13.1*  --  9.8  NEUTROABS  --   --  6,938  HGB 11.5* 9.7* 11.8  HCT 38.3 32.2* 38.3  MCV 81.8  --  78.6*  PLT 346  --  304   Lipid Panel: No results for input(s): "CHOL", "HDL", "LDLCALC", "TRIG", "CHOLHDL", "LDLDIRECT" in the last 8760 hours. TSH: Recent Labs    02/02/23 0809  TSH 0.02*   A1C: Lab Results  Component Value Date   HGBA1C 6.1 (H) 02/02/2023     Assessment/Plan 1. Gastroesophageal reflux disease, unspecified whether esophagitis present -will start Protonix 40 mg daily  -dietary modifications  -to stop pepcid once starting protonix.  -will follow up in 1 month, if symptoms not improving will refer to GI for further investigation.   Janene Harvey. Biagio Borg  [facility] [phone]    Virtual Visit via  video  I connected with patient on 02/25/23 at  2:20 PM EDT by mychart and verified that I am speaking with the correct person using two identifiers.  Location: Patient: home  Provider: psc   I discussed the limitations, risks, security and privacy concerns of performing an evaluation and management service by telephone and the availability of in person appointments. I also discussed with the patient that there may be a patient responsible charge related to this service. The patient expressed understanding and agreed to proceed.   I discussed the assessment and treatment plan with the patient. The patient was provided an opportunity to ask questions and all were answered. The patient agreed with the plan and demonstrated an understanding of the instructions.   The patient was advised to call back or seek an in-person evaluation if the symptoms worsen or if the condition fails to improve as anticipated.  I provided 15 minutes of non-face-to-face time during this encounter.  Janene Harvey. Biagio Borg Avs printed and mailed

## 2023-02-28 NOTE — Telephone Encounter (Signed)
Message routed to PCP Eubanks, Jessica K, NP  

## 2023-03-03 ENCOUNTER — Ambulatory Visit: Payer: Medicaid Other

## 2023-03-03 DIAGNOSIS — G8929 Other chronic pain: Secondary | ICD-10-CM

## 2023-03-03 DIAGNOSIS — M25612 Stiffness of left shoulder, not elsewhere classified: Secondary | ICD-10-CM

## 2023-03-03 NOTE — Therapy (Signed)
[facility] [address] [place], [address] Phone: [phone]   Fax:  [phone]  Physical Therapy Treatment And Discharge Summary   Patient Details  Name: Linda Ware MRN: [id number] Date of Birth: [birth year] Referring Provider (PT): Ziglar, Eli Phillips, MD   Encounter Date: 03/03/2023   PT End of Session - 03/03/23 0732     Visit Number 14    Number of Visits 29    Date for PT Re-Evaluation 03/11/23    Authorization Type [place] Medicaid    Progress Note Due on Visit 20    PT Start Time 0732    PT Stop Time 0813    PT Time Calculation (min) 41 min    Activity Tolerance Patient tolerated treatment well;No increased pain    Behavior During Therapy [facility] for tasks assessed/performed                        Past Medical History:  Diagnosis Date   Anemia    Asthma, allergic    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 Atypical intraepidermal melanocytic neoplasm with mild to moderate atypia. Excised 03/18/2010, margins free.   B12 deficiency    Chicken pox    Constipation    Crohn's disease (HCC)    Cystocele    Dysplastic nevus 03/26/2008   Left side/waistline. Slight to moderate atypia and halo nevus effect.   Dysplastic nevus 02/03/2010   Left mid side, minimal atypia.   Dysplastic nevus 03/25/2010   Right volar wrist. Mild atypia, edges free.   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 Lurline Del   HERNIA REPAIR     kidney stone removal     x multiple occasions   PUBOVAGINAL SLING N/A 04/23/2022   Procedure: MID URETHRAL SLING, CYSTOSCOPY;  Surgeon: Crist Fat, MD;  Location: [facility];  Service: Urology;  Laterality: N/A;  270 MINUTES NEEDED FOR CASE   ROBOTIC ASSISTED LAPAROSCOPIC HYSTERECTOMY AND SALPINGECTOMY N/A 04/23/2022   Procedure: XI ROBOTIC ASSISTED LAPAROSCOPIC SUPRACERVICAL HYSTERECTOMY AND BILATERAL SALPINGECTOMY;  Surgeon: Crist Fat, MD;  Location: [facility];  Service: Urology;  Laterality: N/A;   ROBOTIC ASSISTED LAPAROSCOPIC SACROCOLPOPEXY N/A 04/23/2022   Procedure: XI ROBOTIC ASSISTED LAPAROSCOPIC SACROCOLPOPEXY;  Surgeon: Crist Fat, MD;  Location: [facility];  Service: Urology;  Laterality: N/A;   THYROIDECTOMY  2007   tonsillectomy  There were no vitals filed for this visit.   Objective measurements completed on examination: See above findings.    PCP: Alease Medina, MD   REFERRING PROVIDER: Alease Medina, MD   REFERRING DIAG: [phone] (ICD-10-CM) - Stiffness of left shoulder, not elsewhere classified   THERAPY DIAG:  Stiffness of left shoulder, not elsewhere classified  Chronic left shoulder pain  Rationale for Evaluation and Treatment: Rehabilitation  ONSET DATE:   11/25/22   Date PT referral signed      Rationale for Evaluation and Treatment Rehabilitation  PERTINENT HISTORY: L shoulder stiffness. L shoulder proximal humerus fx June 2023. Fracture has healed. Did PT for 6 months. Wants to try PT again to improve ROM prior to doing  surgery. R hand dominant. Stiffness has not changed for the past 6-7 months.   PRECAUTIONS: Pt states she is borderline osteopenia/osteoporosis.    SUBJECTIVE:   SUBJECTIVE STATEMENT: L shoulder is good. Has the list of the exercises at home. Feels like she can continue with her HEP. L shoulder has gotten farther than initially. No pain currently,     PAIN:  Are you having pain? See subjective   TODAY'S TREATMENT: DATE: 03/03/23  No latex allergies  No blood pressure problems per pt.   Pt states she is borderline osteopenia/osteoporosis.   Does wall slides for L shoulder flexion AAROM at home  B upper trap muscle tension; L teres major muscle tension     Manual therapy   Supine STM L teres major with L shoulder in end range flexion and abduction to decrease muscle tension and improve ROM    TherEx:   L shoulder flexion and abduction AROM 1x each way  L shoulder functional IR 1x  Reviewed progress/current status with PT towards goals.    Supine L shoulder in scapular plane:  ER AAROM with PT 10x3 with 5 second holds to improve ROM  R S/L L shoulder ER 10x5 seconds   Then with 1 lbs 10x2 with 5 second holds   Standing B scapular retraction green band 10x5 seconds   Then with B shoulder extension 10x3  Seated L shoulder self inferior glide 10x10 seconds for 2 sets  Standing L pectoralis doorway stretch 30 seconds x 3     Improved exercise technique, movement at target joints, use of target muscles after mod verbal, visual, tactile cues.        Response to treatment Pt tolerated session well without aggravation of symptoms.     Clinical impression   Pt demonstrates overall improved L shoulder AROM, and strength since initial evaluation. Pt has make some progress with PT towards goals. Skilled physical therapy services discharged with pt continuing progress with her exercises at home.          PATIENT EDUCATION: Education details:  there-ex, HEP Person educated: Patient Education method: Explanation, Demonstration, Tactile cues, and Verbal cues, handout Education comprehension: verbalized understanding and returned demonstration  HOME EXERCISE PROGRAM: Access Code: [email or web address] URL: [email or web address] Date: 12/06/2022 Prepared by: Loralyn Freshwater  Exercises - Seated Shoulder Inferior Glide  - 3 x daily - 7 x weekly - 3 sets - 10 reps - 5 seconds hold  - Isometric Tricep Extension   - 1 x daily - 7 x weekly - 3 sets - 10 reps - 5 seconds hold  - Supine Shoulder Flexion Extension Full Range AROM  - 1 x daily - 7 x weekly - 3 sets - 10 reps - 5 seconds hold   -  Seated Shoulder Flexion AAROM with Pulley Behind  - 3 x daily - 7 x weekly - 3 sets - 10 reps - 5 seconds hold - Seated Shoulder Abduction AAROM with Pulley Behind  - 3 x daily - 7 x weekly - 3 sets - 10 reps - 5 seconds hold   - Seated Shoulder External Rotation AAROM with Dowel  - 3 x daily - 7 x weekly - 3 sets - 10 reps - 5 seconds hold  - First Rib Mobilization with Strap  - 2 x daily - 7 x weekly - 1 sets - 5 reps - 30 seconds hold  - Sidelying Shoulder External Rotation AROM  - 1 x daily - 7 x weekly - 3 sets - 10 reps - 5 seconds hold  - Shoulder Adduction with Anchored Resistance  - 1 x daily - 7 x weekly - 3 sets - 10 reps - 5 seconds hold  Red band  - Supine Shoulder External Rotation with Dowel  - 1 x daily - 7 x weekly - 3 sets - 10 reps - 5 seconds hold      PT Short Term Goals - 12/30/22 1524       PT SHORT TERM GOAL #1   Title Pt will be independent with her initial HEP to improve L shoulder AROM, strength, and function.    Baseline Pt has not yet started her HEP (12/02/2022); doing her exercises, not as often as she should, no questions (12/30/2022)    Time 3    Period Weeks    Status Achieved    Target Date 12/24/22               PT Long Term Goals - 03/03/23 0735       PT LONG TERM GOAL #1   Title Pt will  improve L shoulder AROM for flexion and abduction to 145 degrees and 130 degrees respectively to promote ability to reach up and to the side with less difficulty.    Baseline L shoulder AROM for flexion 85 degrees, abduction 72 degrees (12/02/2022);  flexion 101 degrees,  abduction 85 degrees (01/24/2023); L shoulder flexion 107 degrees, abduction 84 degrees (03/03/2023)    Time 6    Period Weeks    Status Partially Met    Target Date 03/11/23      PT LONG TERM GOAL #2   Title Pt will improve L shoulder functional IR to L thumb to T10 spinous process to promote ability to reach behind her back as well as don and doff clothes with less difficulty.    Baseline L shoulder functional IR: L thumb to L3 spinous process (12/02/2022); L thumb to L 1 spoinous process (01/24/2023), (03/03/2023)    Time 6    Period Weeks    Status Partially Met    Target Date 03/11/23      PT LONG TERM GOAL #3   Title Pt will improve L shoulder flexion, abduction, ER, IR strength by at least 1/2 MMT at available range to promote ability to raise her arm up with less difficulty.    Baseline At available range: flexion 4/5, abduction 4/5, ER 4-/5, IR 4/5 (12/02/2022); flexion 4+/5, abduction 4+/5, ER 4/5, IR 4+/5 (01/24/2023)    Time 8    Period Weeks    Status Achieved    Target Date 01/28/23      PT LONG TERM GOAL #4   Title Pt will improve her L shoulder FOTO score by at  least 10 points as a demonstration of improved function.    Baseline L shoulder FOTO 54 (12/02/2022); 61 (01/24/2023); 57 (03/03/2023)    Time 6    Period Weeks    Status Partially Met    Target Date 03/11/23                    Plan - 03/03/23 0731     Personal Factors and Comorbidities Comorbidity 3+;Fitness;Past/Current Experience;Time since onset of injury/illness/exacerbation    Comorbidities Depression, joint pain, thyroid CA, sleep apnea    Examination-Activity Limitations Reach Overhead;Dressing;Lift    Stability/Clinical  Decision Making Stable/Uncomplicated    Rehab Potential Fair    PT Frequency 2x / week    PT Duration 6 weeks    PT Treatment/Interventions Therapeutic activities;Therapeutic exercise;Neuromuscular re-education;Manual techniques;Dry needling;Electrical Stimulation    PT Next Visit Plan scapular, ER, IR strengthening, AAROM, manual techniques, modalities PRN    PT Home Exercise Plan Medbridge Access Code: [email or web address]    Consulted and Agree with Plan of Care Patient                 Patient will benefit from skilled therapeutic intervention in order to improve the following deficits and impairments:  Pain, Postural dysfunction, Improper body mechanics, Impaired UE functional use, Decreased strength, Decreased range of motion  Visit Diagnosis: Stiffness of left shoulder, not elsewhere classified  Chronic left shoulder pain     Problem List Patient Active Problem List   Diagnosis Date Noted   Pelvic prolapse 04/23/2022   At risk for activity intolerance 05/20/2020   Crohn's disease with complication (HCC) 04/29/2020   Other hemorrhoids 04/10/2020   At risk for complication associated with hypotension 04/10/2020   Depression 03/20/2020   At risk for osteoporosis 03/20/2020   Prediabetes 03/06/2020   Other specified hypothyroidism 03/06/2020   Absolute anemia 03/06/2020   At risk for diabetes mellitus 03/06/2020   Mood disorder (HCC) 02/21/2020   Vitamin B12 deficiency 02/21/2020   Vitamin D deficiency 02/21/2020   Anemia 02/21/2020   OSA (obstructive sleep apnea) 02/21/2020   Diarrhea 05/12/2018   Other dysphagia 05/12/2018   Rectal bleeding 05/12/2018   Abnormal uterine bleeding 08/04/2017   Stress incontinence 08/20/2015   Vaginal pessary in situ 08/20/2015   Cystocele with small rectocele and uterine descent 08/20/2015   Cystocele or rectocele with incomplete uterine prolapse 01/29/2015   Hypersomnia with sleep apnea 10/02/2014   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/2014    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   Loralyn Freshwater PT, DPT Physical Therapist - [facility]  03/03/23, 8:29 AM   [facility] [address] [place], [address] Phone: [phone]   Fax:  [phone]  Name: Linda Ware MRN: [id number] Date of Birth: 1970/10/12

## 2023-03-30 ENCOUNTER — Other Ambulatory Visit: Payer: Medicaid Other

## 2023-03-30 DIAGNOSIS — E038 Other specified hypothyroidism: Secondary | ICD-10-CM

## 2023-03-30 DIAGNOSIS — D509 Iron deficiency anemia, unspecified: Secondary | ICD-10-CM

## 2023-03-30 NOTE — Telephone Encounter (Signed)
Can you help with this? Looks like her referral needs to be sent else where due to insurance issues.

## 2023-03-31 NOTE — Telephone Encounter (Signed)
Thank you :)

## 2023-03-31 NOTE — Telephone Encounter (Signed)
Pt is aware that the office has to review the referral first once accepted it will be sent to the provider's secretary for scheduling. I spoke to an [name] at the office she did take my name and # down and stating that she would send a message to the person that handles the referrals to be on the lookout for this pt.    Linda Ware

## 2023-04-04 ENCOUNTER — Ambulatory Visit: Payer: Medicaid Other | Admitting: Nurse Practitioner

## 2023-04-05 ENCOUNTER — Other Ambulatory Visit: Payer: Self-pay | Admitting: Nurse Practitioner

## 2023-04-05 MED ORDER — ALBUTEROL SULFATE HFA 108 (90 BASE) MCG/ACT IN AERS: 1.0000 | INHALATION_SPRAY | Freq: Four times a day (QID) | RESPIRATORY_TRACT | 1 refills | Status: DC | PRN

## 2023-04-05 NOTE — Telephone Encounter (Signed)
Patient is calling requesting a refill on the pended medication. Patient was last given this medication in 2021 by a historical provider. Patient states she just realized her medication was expired and has asthma. Is this refill appropriate?

## 2023-05-09 ENCOUNTER — Other Ambulatory Visit: Payer: Self-pay | Admitting: Student

## 2023-05-09 DIAGNOSIS — R1011 Right upper quadrant pain: Secondary | ICD-10-CM

## 2023-05-16 ENCOUNTER — Other Ambulatory Visit: Payer: Medicaid Other

## 2023-05-20 ENCOUNTER — Other Ambulatory Visit: Payer: Self-pay | Admitting: Nurse Practitioner

## 2023-05-21 ENCOUNTER — Emergency Department
Admission: EM | Admit: 2023-05-21 | Discharge: 2023-05-21 | Discharge status: Against Medical Advice | Payer: Medicaid Other | Attending: Emergency Medicine | Admitting: Emergency Medicine

## 2023-05-21 DIAGNOSIS — R1033 Periumbilical pain: Secondary | ICD-10-CM | POA: Insufficient documentation

## 2023-05-21 DIAGNOSIS — Z5321 Procedure and treatment not carried out due to patient leaving prior to being seen by health care provider: Secondary | ICD-10-CM | POA: Diagnosis not present

## 2023-05-21 LAB — CBC
HCT: 38.6 % (ref 36.0–46.0)
Hemoglobin: 12.2 g/dL (ref 12.0–15.0)
MCH: 24.6 pg — ABNORMAL LOW (ref 26.0–34.0)
MCHC: 31.6 g/dL (ref 30.0–36.0)
MCV: 77.8 fL — ABNORMAL LOW (ref 80.0–100.0)
Platelets: 372 10*3/uL (ref 150–400)
RBC: 4.96 MIL/uL (ref 3.87–5.11)
RDW: 14.4 % (ref 11.5–15.5)
WBC: 13.4 10*3/uL — ABNORMAL HIGH (ref 4.0–10.5)
nRBC: 0 % (ref 0.0–0.2)

## 2023-05-21 LAB — COMPREHENSIVE METABOLIC PANEL
ALT: 15 U/L (ref 0–44)
AST: 16 U/L (ref 15–41)
Albumin: 4 g/dL (ref 3.5–5.0)
Alkaline Phosphatase: 92 U/L (ref 38–126)
Anion gap: 8 (ref 5–15)
BUN: 15 mg/dL (ref 6–20)
CO2: 23 mmol/L (ref 22–32)
Calcium: 8.7 mg/dL — ABNORMAL LOW (ref 8.9–10.3)
Chloride: 101 mmol/L (ref 98–111)
Creatinine, Ser: 0.8 mg/dL (ref 0.44–1.00)
GFR, Estimated: >60 mL/min (ref >60)
Glucose, Bld: 99 mg/dL (ref 70–99)
Potassium: 4 mmol/L (ref 3.5–5.1)
Sodium: 132 mmol/L — ABNORMAL LOW (ref 135–145)
Total Bilirubin: 0.6 mg/dL (ref 0.0–1.2)
Total Protein: 7.5 g/dL (ref 6.5–8.1)

## 2023-05-21 LAB — URINALYSIS, ROUTINE W REFLEX MICROSCOPIC
Bilirubin Urine: NEGATIVE
Glucose, UA: NEGATIVE mg/dL
Hgb urine dipstick: NEGATIVE
Ketones, ur: NEGATIVE mg/dL
Leukocytes,Ua: NEGATIVE
Nitrite: NEGATIVE
Protein, ur: NEGATIVE mg/dL
Specific Gravity, Urine: 1.021 (ref 1.005–1.030)
pH: 5 (ref 5.0–8.0)

## 2023-05-21 LAB — POC URINE PREG, ED: Preg Test, Ur: NEGATIVE

## 2023-05-21 LAB — LIPASE, BLOOD: Lipase: 43 U/L (ref 11–51)

## 2023-05-21 NOTE — ED Provider Triage Note (Signed)
 Emergency Medicine Provider Triage Evaluation Note  Linda Ware , a 53 y.o. female  was evaluated in triage.  Pt complains of pain and bulge above umbilicus. History of umbilical hernia with mesh. Area is tender to touch. No vomiting. Normal bowel movements. Normal appetite. No fever.  Physical Exam  There were no vitals taken for this visit. Gen:   Awake, no distress   Resp:  Normal effort  MSK:   Moves extremities without difficulty  Other:    Medical Decision Making  Medically screening exam initiated at 1:55 PM.  Appropriate orders placed.  Linda Ware was informed that the remainder of the evaluation will be completed by another provider, this initial triage assessment does not replace that evaluation, and the importance of remaining in the ED until their evaluation is complete.  Abdominal pain protocol started.   Linda Kirk NOVAK, FNP 05/21/23 1356

## 2023-05-21 NOTE — ED Triage Notes (Signed)
 Pt reports history of umbilical hernia and had it repaired 20 years ago. Pt reports intermittent "bulge" above the umbilicus. Pt denies N/V/D.

## 2023-05-21 NOTE — ED Notes (Signed)
 Pt reported to this RN that she wants to leave.  Pt encouraged to stay and reminded she can return if symptoms get worse.

## 2023-05-24 ENCOUNTER — Telehealth: Payer: Self-pay

## 2023-05-24 ENCOUNTER — Ambulatory Visit
Admission: RE | Admit: 2023-05-24 | Discharge: 2023-05-24 | Disposition: A | Discharge status: Home / Self Care | Payer: Medicaid Other | Source: Ambulatory Visit | Attending: Student

## 2023-05-24 DIAGNOSIS — R1011 Right upper quadrant pain: Secondary | ICD-10-CM

## 2023-05-24 NOTE — Transitions of Care (Post Inpatient/ED Visit) (Signed)
   05/24/2023  Name: Linda Ware MRN: [id number] DOB: February 03, 1971  Today's TOC FU Call Status: Today's TOC FU Call Status:: Successful TOC FU Call Completed TOC FU Call Complete Date: 05/24/23 Patient's Name and Date of Birth confirmed.  Transition Care Management Follow-up Telephone Call Date of Discharge: 05/21/23 Discharge Facility: [facility]) Type of Discharge: Emergency Department Reason for ED Visit: Other: (Hernia)  Items Reviewed:    Medications Reviewed Today: Medications Reviewed Today   Medications were not reviewed in this encounter     Home Care and Equipment/Supplies:    Functional Questionnaire:    Follow up appointments reviewed:   PATIENT HAS SPECIALIST TAKING OVER HER CARE. PATIENT DOESN'T WANT FOLLOW UP WITH PCP BECAUSE ANOTHER PROVIDER IS TAKING OVER THIS ISSUE   SIGNATURE: Teyanna Thielman.D/RMA

## 2023-06-01 ENCOUNTER — Other Ambulatory Visit: Payer: Self-pay | Admitting: General Surgery

## 2023-06-01 DIAGNOSIS — K439 Ventral hernia without obstruction or gangrene: Secondary | ICD-10-CM

## 2023-06-06 ENCOUNTER — Ambulatory Visit
Admission: RE | Admit: 2023-06-06 | Discharge: 2023-06-06 | Disposition: A | Discharge status: Home / Self Care | Payer: Medicaid Other | Source: Ambulatory Visit | Attending: General Surgery | Admitting: General Surgery

## 2023-06-06 DIAGNOSIS — K439 Ventral hernia without obstruction or gangrene: Secondary | ICD-10-CM

## 2023-06-06 MED ORDER — IOPAMIDOL (ISOVUE-300) INJECTION 61%
100.0000 mL | Freq: Once | INTRAVENOUS | Status: AC | PRN
  Administered 2023-06-06: 100 mL via INTRAVENOUS

## 2023-06-20 ENCOUNTER — Encounter: Payer: Self-pay | Admitting: Nurse Practitioner

## 2023-06-21 ENCOUNTER — Encounter: Payer: Self-pay | Admitting: Family Medicine

## 2023-06-21 ENCOUNTER — Ambulatory Visit (INDEPENDENT_AMBULATORY_CARE_PROVIDER_SITE_OTHER): Payer: Medicaid Other | Admitting: Family Medicine

## 2023-06-21 VITALS — BP 125/85 | HR 78 | Temp 97.5°F | Resp 18 | Ht 64.0 in | Wt 262.8 lb

## 2023-06-21 DIAGNOSIS — Z1211 Encounter for screening for malignant neoplasm of colon: Secondary | ICD-10-CM | POA: Diagnosis not present

## 2023-06-21 DIAGNOSIS — R7303 Prediabetes: Secondary | ICD-10-CM | POA: Diagnosis not present

## 2023-06-21 DIAGNOSIS — Z1231 Encounter for screening mammogram for malignant neoplasm of breast: Secondary | ICD-10-CM

## 2023-06-21 NOTE — Telephone Encounter (Unsigned)
Copied from CRM [id number]. Topic: Appointments - Transfer of Care >> Jun 21, 2023 10:16 AM Gildardo Pounds wrote: Pt is requesting to transfer FROM: Sharon Seller, NP Pt is requesting to transfer TO: Ziglar,Kimberly S Reason for requested transfer: location It is the responsibility of the team the patient would like to transfer to (Dr. Dian Situ) to reach out to the patient if for any reason this transfer is not acceptable.

## 2023-06-21 NOTE — Progress Notes (Unsigned)
   Established Patient Office Visit  Subjective   Patient ID: Linda Ware, female    DOB: Mar 11, 1971  Age: 53 y.o. MRN: [id number]  Chief Complaint  Patient presents with  . Establish Care    HPI 53 yo pleasant female with hiatal hernia, ventral hernia, morbid obesity, GERD (EGD 03/2023, stomach ulcer) hypothyroid second to thyroidectomy, (followed by Endo), asthma, kidney stones (lithotripsy and stent placement), Crohn's disease (last colonoscopy 2020 and given a 10-year follow-up) osteopenia not on bisphosphonate calcium or vitamin D.  She is here for bariatric surgery evaluation.  BMI is 45.11 today.  She has comorbidities of reflux and hiatal hernia.  She does not have hypertension she does not have osteoarthritis of the knees hip feet or ankles and she does not have diabetes.   {History (Optional):23778}  ROS    Objective:     BP 125/85 (BP Location: Left Arm, Patient Position: Sitting, Cuff Size: Large)   Pulse 78   Temp (!) 97.5 F (36.4 C) (Oral)   Resp 18   Ht 5\' 4"  (1.626 m) Comment: per patient  Wt 262 lb 12.8 oz (119.2 kg)   SpO2 97%   BMI 45.11 kg/m  {Vitals History (Optional):23777}  Physical Exam ***  {Perform Simple Foot Exam  Perform Detailed exam:1} {Insert foot Exam (Optional):30965}   No results found for any visits on 06/21/23.  {Labs (Optional):23779}  The ASCVD Risk score (Arnett DK, et al., 2019) failed to calculate for the following reasons:   Cannot find a previous HDL lab   Cannot find a previous total cholesterol lab    Assessment & Plan:  Encounter for screening mammogram for malignant neoplasm of breast -     3D Screening Mammogram, Left and Right; Future  Screening for colon cancer -     Ambulatory referral to Gastroenterology     No follow-ups on file.    Alease Medina, MD

## 2023-06-23 NOTE — Assessment & Plan Note (Signed)
A1c range from 5.7% to 6.1%

## 2023-06-23 NOTE — Assessment & Plan Note (Addendum)
She is interested in gastric bypass surgery.  Completed forms and gave her a letter of support. Forms scanned to her chart.

## 2023-07-11 ENCOUNTER — Other Ambulatory Visit (HOSPITAL_COMMUNITY): Payer: Self-pay | Admitting: General Surgery

## 2023-07-11 DIAGNOSIS — K439 Ventral hernia without obstruction or gangrene: Secondary | ICD-10-CM

## 2023-07-11 DIAGNOSIS — K5090 Crohn's disease, unspecified, without complications: Secondary | ICD-10-CM

## 2023-07-11 DIAGNOSIS — K219 Gastro-esophageal reflux disease without esophagitis: Secondary | ICD-10-CM

## 2023-07-14 ENCOUNTER — Ambulatory Visit
Admission: RE | Admit: 2023-07-14 | Discharge: 2023-07-14 | Disposition: A | Discharge status: Home / Self Care | Source: Ambulatory Visit | Attending: General Surgery | Admitting: General Surgery

## 2023-07-14 DIAGNOSIS — K5090 Crohn's disease, unspecified, without complications: Secondary | ICD-10-CM

## 2023-07-14 DIAGNOSIS — K219 Gastro-esophageal reflux disease without esophagitis: Secondary | ICD-10-CM | POA: Insufficient documentation

## 2023-07-14 DIAGNOSIS — K439 Ventral hernia without obstruction or gangrene: Secondary | ICD-10-CM

## 2023-07-14 DIAGNOSIS — K449 Diaphragmatic hernia without obstruction or gangrene: Secondary | ICD-10-CM | POA: Diagnosis present

## 2023-07-15 ENCOUNTER — Encounter: Payer: Self-pay | Admitting: Dietician

## 2023-07-15 ENCOUNTER — Encounter: Attending: Nurse Practitioner | Admitting: Dietician

## 2023-07-15 VITALS — Ht 64.0 in | Wt 266.8 lb

## 2023-07-15 DIAGNOSIS — E669 Obesity, unspecified: Secondary | ICD-10-CM | POA: Diagnosis present

## 2023-07-15 NOTE — Progress Notes (Signed)
 Nutrition Assessment for Bariatric Surgery: Pre-Surgery Behavioral and Nutrition Intervention Program   Medical Nutrition Therapy  Appt Start Time: 1124    End Time: 1218  Patient was seen on 07/15/2023 for Pre-Operative Nutrition Assessment. Purpose of todays visit  enhance perioperative outcomes along with a healthy weight maintenance   Referral stated Supervised Weight Loss (SWL) visits needed: 3 Months  Planned surgery: RYGB Pt expectation of surgery: to be in the 150s; be more mobile; cross legs  NUTRITION ASSESSMENT   Anthropometrics  Start weight at [facility]: 266.8 lbs (date: 07/15/2023)  Height: 64 in BMI: 45.80 kg/m2     Clinical   Pharmacotherapy: History of weight loss medication used: GLP1, appetite suppressants, stimulant (didn't feel good)  Medical hx: asthma, cancer, crohn's/colitis, obesity Medications: levothyroxine, liothyronine, albuterol as needed, famotidine Labs: chol 205; triglycerides 179; TSH 0.336; iron saturation 14; A1c 5.9; Vit D 22.7 Notable signs/symptoms: none noted Any previous deficiencies? No  Evaluation of Nutritional Deficiencies: Micronutrient Nutrition Focused Physical Exam: Hair: No issues observed Eyes: No issues observed Mouth: No issues observed Neck: No issues observed Nails: No issues observed Skin: No issues observed  Lifestyle & Dietary Hx  Pt states she has been declined for government assistance with food. Pt states she runs out of money for food. 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  Pt states she has a history of kidney stones.  Current Physical Activity Recommendations state 150 minutes per week of moderate to vigorous movement including Cardio and 1-2 days of resistance activities as well as flexibility/balance activities:  Pts current physical activity: 30 pushups/ 30 wall pushups a couple of days per week, with 10-20% recommendation reached   Sleep Hygiene: duration and quality: good  Current  Patient Perceived Stress Level as stated by pt on a scale of 1-10:  5-6       Stress Management Techniques: pray, journal, talk to friends and mentors, Copywriter, advertising on phone  According to the Dietary Guidelines for Americans Recommendation: equivalent 1.5-2 cups fruits per day, equivalent 2-3 cups vegetables per day and at least half all grains whole  Fruit servings per day (on average): 1, meeting 50% recommendation  Non-starchy vegetable servings per day (on average): 1-2, meeting 33-66% recommendation  Whole Grains per day (on average): 1  Number of meals missed/skipped per week out of 21: 0  24-Hr Dietary Recall First Meal: coffee, eggs, yogurt with granola Snack: candy Second Meal: fast food or sandwiches (catered) Snack: candy Third Meal: grilled chicken, broccoli (with and without cheese), maybe a dessert Snack:  Beverages: water, coffee (1-2 cups)  Alcoholic beverages per week: 0 (1-2 drinks a year)   Estimated Energy Needs Calories: 1500   NUTRITION DIAGNOSIS  Overweight/obesity ([place]-3.3) related to past poor dietary habits and physical inactivity as evidenced by patient w/ planned RYGB surgery following dietary guidelines for continued weight loss.  NUTRITION INTERVENTION  Nutrition counseling (C-1) and education (E-2) to facilitate bariatric surgery goals.  Educated pt on micronutrient deficiencies post-surgery and behavioral/dietary strategies to start in order to mitigate that risk   Behavioral and Dietary Interventions Pre-Op Goals Reviewed with the Patient Nutrition: Healthy Eating Behaviors Switch to non-caloric, non-carbonated and non-caffeinated beverages such as  water, unsweetened tea, Crystal Light and zero calorie beverages (aim for 64 oz. per day) Cut out grazing between meals or at night  Find a protein shake you like Eat every 3-5 hours        Eliminate distractions while eating (TV,  computer, reading, driving, texting) Take 40-98 minutes to eat a meal   Decrease high sugar foods/decrease high fat/fried foods Eliminate alcoholic beverages Increase protein intake (eggs, fish, chicken, yogurt) before surgery; track protein; aim for 60 grams per day. Eat non starchy vegetables 2 times a day 7 days a week Eat complex carbohydrates such as whole grains and fruits Start taking vitamin D   Behavioral Modification: Physical Activity Increase my usual daily activity (use stairs, park farther, etc.) Engage in 30/30 activity  ___10____ minutes ___4___ times per week  Other:       Problem Solving I will think about my usual eating patterns and how to tweak them How can my friends and family support me Barriers to starting my changes Learn and understand appetite verses hunger   Healthy Coping Allow for ___________ activities per week to help me manage stress Reframe negative thoughts I will keep a picture of someone or something that is my inspiration & look at it daily   Monitoring  Weigh myself once a week  Measure my progress by monitoring how my clothes fit Keep a food record of what I eat and drink for the next ________ (time period) Take pictures of what I eat and drink for the next ________ (time period) Use an app to count steps/day for the next_______ (time period) Measure my progress such as increased energy and more restful sleep Monitor your acid reflux and bowel habits, are they getting better?   *Goals that are bolded indicate the pt would like to start working towards these  Handouts Provided Include  Bariatric Surgery handouts (Nutrition Visits, Pre Surgery Behavioral Change Goals, Protein Shakes Brands to Choose From, Vitamins & Mineral Supplementation)  Learning Style & Readiness for Change Teaching method utilized: Visual, Auditory, and hands on  Demonstrated degree of understanding via: Teach Back  Readiness Level: preparation Barriers to learning/adherence to lifestyle change: nothing identified   RD's Notes for  Next Visit Patient Progress toward chosen goals    MONITORING & EVALUATION Dietary intake, weekly physical activity, body weight, and preoperative behavioral change goals   Next Steps  Patient is to follow up at [facility] in 2 weeks for first SWL visit.

## 2023-08-01 ENCOUNTER — Encounter: Attending: General Surgery | Admitting: Dietician

## 2023-08-01 ENCOUNTER — Encounter: Payer: Self-pay | Admitting: Dietician

## 2023-08-01 VITALS — Ht 64.0 in | Wt 267.8 lb

## 2023-08-01 DIAGNOSIS — E669 Obesity, unspecified: Secondary | ICD-10-CM | POA: Insufficient documentation

## 2023-08-01 NOTE — Progress Notes (Signed)
 Supervised Weight Loss Visit Bariatric Nutrition Education Appt Start Time: 1543    End Time: 1605  Planned surgery: RYGB Pt expectation of surgery: to be in the 150s; be more mobile; cross legs  Referral stated Supervised Weight Loss (SWL) visits needed: 3 Months  1 out of 3 SWL Appointments   NUTRITION ASSESSMENT   Anthropometrics  Start weight at [facility]: 266.8 lbs (date: 07/15/2023)  Height: 64 in Weight today: 267.8 lb BMI: 45.97 kg/m2     Clinical  Medical hx: asthma, cancer, crohn's/colitis, obesity Medications: levothyroxine, liothyronine, albuterol as needed, famotidine Labs: chol 205; triglycerides 179; TSH 0.336; iron saturation 14; A1c 5.9; Vit D 22.7 Notable signs/symptoms: none noted Any previous deficiencies? No  Lifestyle & Dietary Hx  Pt states she was more active, walking, stating she did not do well with the 30/30 activity. Pt states she can do better with fluid intake. Pt states she has been intentional with getting protein with every meal. Pt states she has cooked for years for her family, stating she is burned out and doesn't like to think about food. Pt agreeable to increase physical activity and track protein.  Estimated daily fluid intake: 40-60 oz Supplements: vit D Current average weekly physical activity: walking more, stating no intentional activity  24-Hr Dietary Recall First Meal: coffee, eggs or yogurt with granola, maybe toast Snack: chocolate covered almonds Second Meal:  Snack: candy Third Meal: grilled chicken, broccoli (with and without cheese), maybe a dessert Snack:  Beverages: water, coffee (1-2 cups)  Alcoholic beverages per week: 0 (1-2 drinks a year)   Estimated Energy Needs Calories: 1500  NUTRITION DIAGNOSIS  Overweight/obesity ([place]-3.3) related to past poor dietary habits and physical inactivity as evidenced by patient w/ planned RYGB surgery following dietary guidelines for continued weight loss.  NUTRITION INTERVENTION   Nutrition counseling (C-1) and education (E-2) to facilitate bariatric surgery goals.  Pre-Op Goals Reviewed with the Patient  Tracking protein intake is crucial before bariatric surgery to ensure adequate muscle preservation and promote healthy weight loss, as well as to help with the recovery process after surgery. Practice now will create habits now, to increase success after surgery.  Meal planning and prepping means you eat more regularly. Meal planning and prepping can also help you to keep to a more regular meal schedule. Regular small meals are particularly important when you don't feel hungry as often.  Regular physical activity offers numerous health benefits, including improved weight loss and maintenance, enhanced cardiovascular health, and reduced risk of complications and comorbidities. It also helps preserve lean muscle mass, improves bone density, and boosts overall well-being.  Encouraged pt to increase physical activity.  Pre-Op Goals Progress & New Goals Re-Engage in 30/30 activity (30 wall push-ups and 30 squats) 10 minutes 4 times per week Re-engage: track protein; aim for 60 grams per day; aim for a protein with every meal and snack Continue: take vit D New: consider planning and meal prepping to have meals and snacks on-hand to avoid extreme hunger (cheese sticks, nuts, celery sticks, peanut butter)  Handouts Provided Include  Meal Ideas Handout  Learning Style & Readiness for Change Teaching method utilized: Visual & Auditory  Demonstrated degree of understanding via: Teach Back  Readiness Level: preparation 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  MONITORING & EVALUATION Dietary intake, weekly physical activity, body weight, and pre-op goals in 1 month.  Next Steps  Patient is to return to [facility] in 1 month for next SWL visit.

## 2023-08-09 ENCOUNTER — Ambulatory Visit (INDEPENDENT_AMBULATORY_CARE_PROVIDER_SITE_OTHER): Payer: Self-pay | Admitting: Licensed Clinical Social Worker

## 2023-08-09 DIAGNOSIS — F4320 Adjustment disorder, unspecified: Secondary | ICD-10-CM

## 2023-08-09 NOTE — Progress Notes (Signed)
 Virtual Visit via Video Note  I connected with Linda Ware on 08/09/23 at  4:00 PM EDT by a video enabled telemedicine application and verified that I am speaking with the correct person using two identifiers.  Location: Patient: [facility] [place] Provider: [facility] [place]   I discussed the limitations of evaluation and management by telemedicine and the availability of in person appointments. The patient expressed understanding and agreed to proceed.   Comprehensive Clinical Assessment (CCA) Note  08/09/2023 Linda Ware [id number]  Chief Complaint:  Chief Complaint  Patient presents with   BARIATRIC SCREENING   Visit Diagnosis:  Encounter Diagnosis  Name Primary?   Adjustment disorder, unspecified type Yes      Disposition:  Clinician sees no significant psychological factors that would hinder the success of bariatric surgery at time of assessment. Clinician supports patient candidacy for Bariatric Surgery.   Patient reports realistic expectations post surgery, is aware of the pre and post surgical process, client reports that behavioral health diagnosis(es) are stable at time of assessment, client reports positive pre and post surgical support system, and client reports motivation to make positive change.    CCA Biopsychosocial Intake/Chief Complaint:  Screening for bariatric weight loss surgery eligibility  Current Symptoms/Problems: Linda Ware is a 53 year old female reporting for bariatric surgery eligibility assessment.  Linda Ware denies any history or treatment of anxiety, depression, bipolar disorder, or PTSD.  Patient reports that she has had counseling on and off throughout the years, but only in difficult times.  Linda Ware denies any suicidal ideation, homicidal ideation, or any perceptual disturbances.  Patient reports family history of bipolar disorder--patient states that her father committed suicide in 67.  Patient states that she did do some counseling to help her get through that  difficult time of life.  Patient reports that she has a history of Crohn's disease, which is currently managed with medication.  Patient reports a history of thyroid cancer and kidney stones.  Patient denies any current substance use.   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  Feels she has a good personality and is compassionate towards others .  Preferences: Patient reports that she enjoys spending time with her family, friends, and church  Abilities: Sales/social activities   Type of Services Patient Feels are Needed: Pt reports that she would like to move forward with weight loss surgery   Initial Clinical Notes/Concerns: Patient denies any history of depression, anxiety, bipolar disorder, or PTSD   Mental Health Symptoms Depression:  Change in energy/activity; Fatigue   Duration of Depressive symptoms: Greater than two weeks   Mania:  None   Anxiety:   Fatigue   Psychosis:  None   Duration of Psychotic symptoms: No data recorded  Trauma:  None   Obsessions:  None   Compulsions:  None   Inattention:  Fails to pay attention/makes careless mistakes   Hyperactivity/Impulsivity:  None   Oppositional/Defiant Behaviors:  None   Emotional Irregularity:  None   Other Mood/Personality Symptoms:  No data recorded   Mental Status Exam Appearance and self-care  Stature:  Average   Weight:  Obese   Clothing:  Neat/clean   Grooming:  Normal   Cosmetic use:  Age appropriate   Posture/gait:  Normal   Motor activity:  Not Remarkable   Sensorium  Attention:  Normal   Concentration:  Normal   Orientation:  X5   Recall/memory:  Normal  Affect and Mood  Affect:  Appropriate   Mood:  Other (Comment) (Within normal limits)   Relating  Eye contact:  Normal   Facial expression:  Responsive   Attitude toward examiner:  Cooperative    Thought and Language  Speech flow: Clear and Coherent   Thought content:  Appropriate to Mood and Circumstances   Preoccupation:  None   Hallucinations:  None   Organization:  No data recorded  [employer] of Knowledge:  Good   Intelligence:  Above Average   Abstraction:  Normal   Judgement:  Good   Reality Testing:  Realistic   Insight:  Good   Decision Making:  Normal   Social Functioning  Social Maturity:  Responsible   Social Judgement:  Normal   Stress  Stressors:  No data recorded  Coping Ability:  Normal   Skill Deficits:  None   Supports:  Family; Church; Friends/Service system     Religion: Religion/Spirituality Are You A Religious Person?: Yes How Might This Affect Treatment?: Patient reports no religious barriers to treatment  Leisure/Recreation: Leisure / Recreation Do You Have Hobbies?: Yes Leisure and Hobbies: Patient reports spending quality time with family members and working  Exercise/Diet: Exercise/Diet Do You Exercise?: Yes What Type of Exercise Do You Do?: Other (Comment), Run/Walk (squats and wall pushes) How Many Times a Week Do You Exercise?: 6-7 times a week Have You Gained or Lost A Significant Amount of Weight in the Past Six Months?: No Do You Follow a Special Diet?: No (Patient reports that she does not eat a lot of food at mealtime, she prefers to graze or snack throughout the day) Do You Have Any Trouble Sleeping?: No   CCA Employment/Education Employment/Work Situation: Employment / Work Situation Employment Situation: Employed 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 Are You Satisfied With Your Job?: Yes Do You Work More Than One Job?: No Patient's Job has Been Impacted by Current Illness: No What is the Longest Time Patient has Held a Job?: Patient has worked her entire adult life Where was the Patient  Employed at that Time?: Patient did not disclose specific jobs, but states that she has worked many jobs in her adult life but enjoys and loves real estate the best Has Patient ever Been in Equities trader?: No  Education: Education Is Patient Currently Attending School?: No Last Grade Completed: 12 Name of High School: [employer] Did Garment/textile technologist From [employer]?: Yes Did Theme park manager?: Yes What Type of College Degree Do you Have?: [employer] What Was Your Major?: [employer] Did You Have Any Scientist, research (life sciences) In School?: Real estate Did You Have An Individualized Education Program (IIEP): No Did You Have Any Difficulty At [employer]?: No Patient's Education Has Been Impacted by Current Illness: No   CCA Family/Childhood History Family and Relationship History: Family history Marital status: Divorced Divorced, when?: divorced x 2.  divorced  last year. What types of issues is patient dealing with in the relationship?: Patient reports that she still has good relationships with her ex-husbands Additional relationship information: Patient reports that she is not currently in a relationship because she wants to prioritize herself and her overall wellbeing Are you sexually active?: No What is your sexual orientation?: Heterosexual Does patient have children?: Yes How many children?: 5 How is patient's relationship with their children?: Patient reports that she has a good  relationship with all of her children with the exception of 1 daughter that is estranged from the rest of her family members.  Patient reports her 64 year old son is her primary support person both pre and post surgery  Childhood History:  Childhood History By whom was/is the patient raised?: Both parents Additional childhood history information: mom, dad, sister Patient's description of current relationship with people who raised him/her: mom and sister are  in [place]. How were you disciplined when you got in trouble as a child/adolescent?: fair discipline Does patient have siblings?: Yes Number of Siblings: 1 Did patient suffer any verbal/emotional/physical/sexual abuse as a child?: No Did patient suffer from severe childhood neglect?: No Has patient ever been sexually abused/assaulted/raped as an adolescent or adult?: No Was the patient ever a victim of a crime or a disaster?: No Witnessed domestic violence?: No Has patient been affected by domestic violence as an adult?: No  Child/Adolescent Assessment:   CCA Substance Use Alcohol/Drug Use: Alcohol / Drug Use Pain Medications: SEE MAR Prescriptions: SEE MAR Over the Counter: SEE MAR History of alcohol / drug use?: No history of alcohol / drug abuse Longest period of sobriety (when/how long): ONGOING SOBRIETY Negative Consequences of Use:  (NONE) Withdrawal Symptoms: None      ASAM's:  Six Dimensions of Multidimensional Assessment  Dimension 1:  Acute Intoxication and/or Withdrawal Potential:   Dimension 1:  Description of individual's past and current experiences of substance use and withdrawal: HISTORY OF SOCIAL DRINKING--NO MORE THAN 1 OR 2 DRINKS AT ONE TIME RARELY  Dimension 2:  Biomedical Conditions and Complications:   Dimension 2:  Description of patient's biomedical conditions and  complications: NONE  Dimension 3:  Emotional, Behavioral, or Cognitive Conditions and Complications:  Dimension 3:  Description of emotional, behavioral, or cognitive conditions and complications: NONE  Dimension 4:  Readiness to Change:  Dimension 4:  Description of Readiness to Change criteria: PT READY TO MAKE POSITIVE CHANGES  Dimension 5:  Relapse, Continued use, or Continued Problem Potential:  Dimension 5:  Relapse, continued use, or continued problem potential critiera description: GOOD SUPPORT SYSTEM  Dimension 6:  Recovery/Living Environment:  Dimension 6:  Recovery/Iiving environment  criteria description: GOOD SUPPORT SYSTEM  ASAM Severity Score: ASAM's Severity Rating Score: 0  ASAM Recommended Level of Treatment: ASAM Recommended Level of Treatment: Level I Outpatient Treatment   Substance use Disorder (SUD) Substance Use Disorder (SUD)  Checklist Symptoms of Substance Use: Continued use despite having a persistent/recurrent physical/psychological problem caused/exacerbated by use  Recommendations for Services/Supports/Treatments: Recommendations for Services/Supports/Treatments Recommendations For Services/Supports/Treatments: Individual Therapy (Recommending outpatient psychotherapy as needed)  DSM5 Diagnoses: Patient Active Problem List   Diagnosis Date Noted   Morbid obesity (HCC) 06/23/2023   Crohn's disease of large bowel (HCC) 06/03/2021   Crohn's disease with complication (HCC) 04/29/2020   At risk for complication associated with hypotension 04/10/2020   Depression 03/20/2020   Prediabetes 03/06/2020   At risk for diabetes mellitus 03/06/2020   Mood disorder (HCC) 02/21/2020   Vitamin B12 deficiency 02/21/2020   Vitamin D deficiency 02/21/2020   Anemia 02/21/2020   OSA (obstructive sleep apnea) 02/21/2020   Stress incontinence 08/20/2015   Vaginal pessary in situ 08/20/2015   Cystocele with small rectocele and uterine descent 08/20/2015   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/2014   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 Date of Interview:  08/09/23 Gender: F   Date of Report : 08/09/23 Purpose:   Bariatric/Weight-loss Surgery (pre-operative evaluation)    Assessment Instruments:  DSM-5-TR Self-Rated Level 1 Cross-Cutting Symptom Measure--Adult Severity Measure for Generalized Anxiety Disorder--Adult EAT-26  Chief Complaint: Obesity  Client Background: Patient is a 53 year old female seeking weight loss surgery. Patient has college education and is currently working as a Customer service manager.  Patients marital status is divorced with 5 children.   The patient is 5 feet 4 inches tall and 06/16/2004 lbs., reflecting a BMI of 46.3 classifying patient in the obese range and at further risk of co-morbid diseases.  Weight History: Patient reports extreme fluctuations in weight throughout her adult life, including a 100 pound weight loss in the past with hCG.  Eating Patterns: Patient reports that she tends to eat small meals throughout the day versus large meals.  Patient admits that she may snack too much throughout the day.  Related Medical Issues: Crohn's disease, hernia, GI issues, history of thyroid cancer, allergies, history of kidney stones  Family History of Obesity: Patient reports family history of obesity on mother's side.  Tobacco Use: Patient denies tobacco use.   PATIENT BEHAVIORAL ASSESSMENT SCORES  Personal History of Mental Illness: Patient denies treatment for depression and anxiety.   Mental Status Examination: Patient was oriented x5 (person, place, situation, time, and object). Patient was appropriately groomed, and neatly dressed. Patient was alert, engaged, pleasant, and cooperative. Patient denies suicidal and homicidal ideations or any perceptual disturbances. Patient denies self-injury.   DSM-5-TR Self-Rated Level 1 Cross-Cutting Symptom Measure--Adult: 0--patient reported no symptoms at time of assessment.  Severity Measure for Generalized Anxiety  Disorder--Adult: Patient completed a 10-question scale. Total scores can range from 0 to 40. A raw score is calculated by summing the answer to each question, and an average total score is achieved by dividing the raw score by the number of items (e.g., 10). Patient had a total raw score of 0 out of 40 which was divided by the total number of questions answered (10) to get an average score of 0 which indicates no significant anxiety.   EAT-26: The EAT-26 is a twenty-six-question screening tool to identify symptoms of eating disorders and disordered eating. The patient scored 2 out of 26. Scores below a 20 are considered not meeting criteria for disordered eating. Patient denies inducing vomiting, or intentional meal skipping. Patient denies binge eating behaviors. Patient denies laxative abuse. Patient does not meet criteria for a DSM-V eating disorder.  Conclusion & Recommendations:   Health history and current assessment reflect that patient is suitable to be a candidate for bariatric surgery. Patient understands the procedure, the risks associated with it, and the importance of post-operative holistic care (Physical, Spiritual/Values, Relationships, and Mental/Emotional health) with access to resources for support as needed. The patient has made an informed decision to proceed with procedure. The patient is motivated and expressed understanding of the post-surgical requirements. Patient's psychological assessment will be valid from today's date for 6 months (02/08/2024). After that date, a follow-up appointment will be needed to re-evaluate the patient's  psychological status.   Clinician sees no significant psychological factors that would hinder the success of bariatric surgery at time of assessment. Clinician supports patient candidacy for Bariatric Surgery.   Linda Ware, MSW, LCSW Licensed Clinical Social [facility]     Referrals to Alternative  Service(s): Referred to Alternative Service(s):   Place:   Date:   Time:    Referred to Alternative Service(s):   Place:   Date:   Time:    Referred to Alternative Service(s):   Place:   Date:   Time:    Referred to Alternative Service(s):   Place:   Date:   Time:      Collaboration of Care: Other patient to continue with recommendations of bariatric team  Patient/Guardian was advised Release of Information must be obtained prior to any record release in order to collaborate their care with an outside provider. Patient/Guardian was advised if they have not already done so to contact the registration department to sign all necessary forms in order for [place] to release information regarding their care.   Consent: Patient/Guardian gives verbal consent for treatment and assignment of benefits for services provided during this visit. Patient/Guardian expressed understanding and agreed to proceed.   Linda Stapleton R Guerline Happ, LCSW

## 2023-08-09 NOTE — Progress Notes (Signed)
 1

## 2023-08-29 ENCOUNTER — Encounter: Payer: Self-pay | Admitting: Dietician

## 2023-08-29 ENCOUNTER — Encounter: Attending: Nurse Practitioner | Admitting: Dietician

## 2023-08-29 VITALS — Ht 64.0 in | Wt 270.1 lb

## 2023-08-29 DIAGNOSIS — E669 Obesity, unspecified: Secondary | ICD-10-CM | POA: Insufficient documentation

## 2023-08-29 NOTE — Progress Notes (Signed)
 Supervised Weight Loss Visit Bariatric Nutrition Education Appt Start Time: 1603    End Time: 1622  Planned surgery: RYGB Pt expectation of surgery: to be in the 150s; be more mobile; cross legs  Referral stated Supervised Weight Loss (SWL) visits needed: 3 Months  2 out of 3 SWL Appointments   NUTRITION ASSESSMENT   Anthropometrics  Start weight at [facility]: 266.8 lbs (date: 07/15/2023)  Height: 64 in Weight today: 270.1 lb BMI: 46.36 kg/m2     Clinical  Medical hx: asthma, cancer, crohn's/colitis, obesity Medications: levothyroxine , liothyronine, albuterol  as needed, famotidine  Labs: chol 205; triglycerides 179; TSH 0.336; iron  saturation 14; A1c 5.9; Vit D 22.7 Notable signs/symptoms: none noted Any previous deficiencies? No  Lifestyle & Dietary Hx  Pt states she is getting close to 60 grams per day, stating some days much more. Pt states she is using a vibration plate with light weights, and does her 30/30 physical activity 2-3 days per week. Pt states she will work while eating, stating she hears that is not good.  Estimated daily fluid intake: 50-60 oz Supplements: vit D Current average weekly physical activity: walking more, stating no intentional activity  24-Hr Dietary Recall First Meal: coffee, eggs or yogurt with granola, maybe toast Snack: chocolate covered almonds Second Meal:  Snack: candy Third Meal: grilled chicken, broccoli (with and without cheese), maybe a dessert Snack:  Beverages: water , coffee (1-2 cups)  Alcoholic beverages per week: 0 (1-2 drinks a year)   Estimated Energy Needs Calories: 1500  NUTRITION DIAGNOSIS  Overweight/obesity ([place]-3.3) related to past poor dietary habits and physical inactivity as evidenced by patient w/ planned RYGB surgery following dietary guidelines for continued weight loss.  NUTRITION INTERVENTION  Nutrition counseling (C-1) and education (E-2) to facilitate bariatric surgery goals.  Pre-Op Goals Reviewed with  the Patient  Tracking protein intake is crucial before bariatric surgery to ensure adequate muscle preservation and promote healthy weight loss, as well as to help with the recovery process after surgery. Practice now will create habits now, to increase success after surgery.  Meal planning and prepping means you eat more regularly. Meal planning and prepping can also help you to keep to a more regular meal schedule. Regular small meals are particularly important when you don't feel hungry as often.  Regular physical activity offers numerous health benefits, including improved weight loss and maintenance, enhanced cardiovascular health, and reduced risk of complications and comorbidities. It also helps preserve lean muscle mass, improves bone density, and boosts overall well-being.  Encouraged pt to increase physical activity.  Encouraged patient to honor their body's internal hunger and fullness cues.  Throughout the day, check in mentally and rate hunger. Stop eating when satisfied not full regardless of how much food is left on the plate.  Get more if still hungry 20-30 minutes later.  The key is to honor satisfaction so throughout the meal, rate fullness factor and stop when comfortably satisfied not physically full. The key is to honor hunger and fullness without any feelings of guilt or shame.  Pay attention to what the internal cues are, rather than any external factors. This will enhance the confidence you have in listening to your own body and following those internal cues enabling you to increase how often you eat when you are hungry not out of appetite and stop when you are satisfied not full.   Pre-Op Goals Progress & New Goals Re-Engage: increase physical activity; adding walking 3 days per week for 30 minutes Continue: track  protein; aim for 60 grams per day; aim for a protein with every meal and snack Continue: take vit D Continue: consider planning and meal prepping to have meals and  snacks on-hand to avoid extreme hunger (cheese sticks, nuts, celery sticks, peanut butter) New: get rid of distractions; listen to your body; slow down and take 20-30 minutes to eat.; aim for satisfaction before fullness  Handouts Provided Include    Learning Style & Readiness for Change Teaching method utilized: Visual & Auditory  Demonstrated degree of understanding via: Teach Back  Readiness Level: preparation 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  MONITORING & EVALUATION Dietary intake, weekly physical activity, body weight, and pre-op goals in 1 month.   Next Steps  Patient is to return to [facility] in 1 month for next SWL visit.

## 2023-09-13 ENCOUNTER — Ambulatory Visit (INDEPENDENT_AMBULATORY_CARE_PROVIDER_SITE_OTHER): Admitting: Dermatology

## 2023-09-13 ENCOUNTER — Encounter: Payer: Self-pay | Admitting: Dermatology

## 2023-09-13 DIAGNOSIS — L820 Inflamed seborrheic keratosis: Secondary | ICD-10-CM | POA: Diagnosis not present

## 2023-09-13 DIAGNOSIS — L821 Other seborrheic keratosis: Secondary | ICD-10-CM | POA: Diagnosis not present

## 2023-09-13 DIAGNOSIS — Z79899 Other long term (current) drug therapy: Secondary | ICD-10-CM

## 2023-09-13 DIAGNOSIS — L649 Androgenic alopecia, unspecified: Secondary | ICD-10-CM

## 2023-09-13 DIAGNOSIS — Z7189 Other specified counseling: Secondary | ICD-10-CM

## 2023-09-13 MED ORDER — MINOXIDIL 2.5 MG PO TABS: ORAL_TABLET | ORAL | 2 refills | Status: DC

## 2023-09-13 NOTE — Patient Instructions (Signed)

## 2023-09-13 NOTE — Progress Notes (Signed)
 Follow-Up Visit   Subjective  Linda Ware is a 53 y.o. female who presents for the following: The patient has a spot on the left forehead to be evaluated, some may be new or changing and the patient may have concern these could be cancer. Patient c/o hair loss, long history of Alopecia on her scalp, patient wears a hair topper.  Patient denies heart problems and ankle swelling.   The following portions of the chart were reviewed this encounter and updated as appropriate: medications, allergies, medical history  Review of Systems:  No other skin or systemic complaints except as noted in HPI or Assessment and Plan.  Objective  Well appearing patient in no apparent distress; mood and affect are within normal limits.  A focused examination was performed of the following areas: face  Relevant exam findings are noted in the Assessment and Plan.  left forehead at hairline x 1 Stuck-on, waxy, tan-brown papules and plaques -- Discussed benign etiology and prognosis.          Assessment & Plan   ANDROGENETIC ALOPECIA (FEMALE PATTERN HAIR LOSS) Exam: Diffuse thinning of the crown and widening of the midline part with retention of the frontal hairline   Female Androgenic Alopecia is a chronic condition related to genetics and/or hormonal changes.  In women androgenetic alopecia is commonly associated with menopause but may occur any time after puberty.  It causes hair thinning primarily on the crown with widening of the part and temporal hairline recession.  Can use OTC Rogaine (minoxidil) 5% solution/foam as directed.  Oral treatments in female patients who have no contraindication may include : - Low dose oral minoxidil 1.25 - 5mg  daily - Spironolactone 50 - 100mg  bid - Finasteride 2.5 - 5 mg daily Adjunctive therapies include: - Low Level Laser Light Therapy (LLLT) - Platelet-rich plasma injections (PRP) - Hair Transplants or scalp reduction    Treatment Plan: Start  Minoxidil 2.5 mg take 1/2 tablet daily  Doses of oral minoxidil for hair loss are considered 'low dose'. This is because the doses used for hair loss are much lower than the doses which are used for conditions such as high blood pressure (hypertension). The doses used for hypertension are 10-40mg  per day.  Side effects are uncommon at the low doses (up to 2.5 mg/day) used to treat hair loss. Potential side effects, more commonly seen at higher doses, include: Increase in hair growth (hypertrichosis) elsewhere on face and body Temporary hair shedding upon starting medication which may last up to 4 weeks Ankle swelling, fluid retention, rapid weight gain more than 5 pounds Low blood pressure and feeling lightheaded or dizzy when standing up quickly Fast or irregular heartbeat Headaches    INFLAMED SEBORRHEIC KERATOSIS left forehead at hairline x 1 Symptomatic, irritating, patient would like treated.  Destruction of lesion - left forehead at hairline x 1 Complexity: simple   Destruction method: cryotherapy   Informed consent: discussed and consent obtained   Timeout:  patient name, date of birth, surgical site, and procedure verified Lesion destroyed using liquid nitrogen: Yes   Region frozen until ice ball extended beyond lesion: Yes   Outcome: patient tolerated procedure well with no complications   Post-procedure details: wound care instructions given    SEBORRHEIC KERATOSIS - Stuck-on, waxy, tan-brown papules and/or plaques  - Benign-appearing - Discussed benign etiology and prognosis. - Observe - Call for any changes  Return in about 6 months (around 03/15/2024) for Alopecia .  I, Clara Crisp,  CMA, am acting as scribe for Celine Collard, MD .   Documentation: I have reviewed the above documentation for accuracy and completeness, and I agree with the above.  Celine Collard, MD

## 2023-09-26 ENCOUNTER — Encounter: Payer: Self-pay | Admitting: Dietician

## 2023-09-26 ENCOUNTER — Encounter: Attending: General Surgery | Admitting: Dietician

## 2023-09-26 VITALS — Ht 64.0 in | Wt 270.1 lb

## 2023-09-26 DIAGNOSIS — E669 Obesity, unspecified: Secondary | ICD-10-CM | POA: Insufficient documentation

## 2023-09-26 NOTE — Progress Notes (Signed)
 Supervised Weight Loss Visit Bariatric Nutrition Education Appt Start Time: 1556    End Time: 1617  Planned surgery: RYGB Pt expectation of surgery: to be in the 150s; be more mobile; cross legs  Referral stated Supervised Weight Loss (SWL) visits needed: 3 Months  3 out of 3 SWL Appointments   Pt completed visits.   Pt has cleared nutrition requirements.   NUTRITION ASSESSMENT   Anthropometrics  Start weight at [facility]: 266.8 lbs (date: 07/15/2023)  Height: 64 in Weight today: 270.1 lb BMI: 46.36 kg/m2     Clinical  Medical hx: asthma, cancer, crohn's/colitis, obesity Medications: levothyroxine , liothyronine, albuterol  as needed, famotidine  Labs: chol 205; triglycerides 179; TSH 0.336; iron  saturation 14; A1c 5.9; Vit D 22.7 Notable signs/symptoms: none noted Any previous deficiencies? No  Lifestyle & Dietary Hx  Pt states she has been practicing not drinking with her meals, and states she is getting a protein with every meal. Pt states ice with her water  helps her drink it more. Pt states this is a focus area for her prior to surgery, stating she needs to get more fluids. Pt states she is moving more since the first visit, stating she has increased her physical activity.  Estimated daily fluid intake: 40 oz Supplements: vit D Current average weekly physical activity: walking more, stating no intentional activity  24-Hr Dietary Recall First Meal: coffee, eggs or yogurt with granola, maybe toast Snack:  Second Meal: chicken sandwich from [employer] or roll-ups with meat, cheese, salsa, sour cream Snack:  Third Meal: grilled chicken, broccoli (with and without cheese), maybe a dessert Snack:  Beverages: water , coffee (1-2 cups)  Alcoholic beverages per week: 0 (1-2 drinks a year)   Estimated Energy Needs Calories: 1500  NUTRITION DIAGNOSIS  Overweight/obesity ([place]-3.3) related to past poor dietary habits and physical inactivity as evidenced by patient w/ planned RYGB  surgery following dietary guidelines for continued weight loss.  NUTRITION INTERVENTION  Nutrition counseling (C-1) and education (E-2) to facilitate bariatric surgery goals.  Pre-Op Goals Reviewed with the Patient  Tracking protein intake is crucial before bariatric surgery to ensure adequate muscle preservation and promote healthy weight loss, as well as to help with the recovery process after surgery. Practice now will create habits now, to increase success after surgery.  Meal planning and prepping means you eat more regularly. Meal planning and prepping can also help you to keep to a more regular meal schedule. Regular small meals are particularly important when you don't feel hungry as often.  Regular physical activity offers numerous health benefits, including improved weight loss and maintenance, enhanced cardiovascular health, and reduced risk of complications and comorbidities. It also helps preserve lean muscle mass, improves bone density, and boosts overall well-being.  Encouraged pt to increase physical activity.  Encouraged patient to honor their body's internal hunger and fullness cues.  Throughout the day, check in mentally and rate hunger. Stop eating when satisfied not full regardless of how much food is left on the plate.  Get more if still hungry 20-30 minutes later.  The key is to honor satisfaction so throughout the meal, rate fullness factor and stop when comfortably satisfied not physically full. The key is to honor hunger and fullness without any feelings of guilt or shame.  Pay attention to what the internal cues are, rather than any external factors. This will enhance the confidence you have in listening to your own body and following those internal cues enabling you to increase how often you eat when  you are hungry not out of appetite and stop when you are satisfied not full.   Pre-Op Goals Progress & New Goals Continue: increase physical activity; adding walking 3 days  per week for 30 minutes Continue: track protein; aim for 60 grams per day; aim for a protein with every meal and snack Continue: take vit D Continue: consider planning and meal prepping to have meals and snacks on-hand to avoid extreme hunger (cheese sticks, nuts, celery sticks, peanut butter) Continue: get rid of distractions; listen to your body; slow down and take 20-30 minutes to eat.; aim for satisfaction before fullness  Handouts Provided Include    Learning Style & Readiness for Change Teaching method utilized: Visual & Auditory  Demonstrated degree of understanding via: Teach Back  Readiness Level: preparation 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  MONITORING & EVALUATION Dietary intake, weekly physical activity, body weight.  Next Steps  Pt has completed visits. No further supervised visits required/recommended. Patient is to return to [facility] for pre-op class >2 weeks prior to scheduled surgery.

## 2023-10-17 ENCOUNTER — Ambulatory Visit: Payer: Self-pay | Admitting: General Surgery

## 2023-10-21 NOTE — Discharge Instructions (Signed)

## 2023-10-24 ENCOUNTER — Encounter: Attending: General Surgery | Admitting: Dietician

## 2023-10-24 ENCOUNTER — Encounter: Payer: Self-pay | Admitting: Dietician

## 2023-10-24 VITALS — Ht 64.0 in | Wt 269.3 lb

## 2023-10-24 DIAGNOSIS — E669 Obesity, unspecified: Secondary | ICD-10-CM | POA: Insufficient documentation

## 2023-10-24 NOTE — Progress Notes (Signed)
 Pre-Operative Nutrition Class:    Patient was seen on 10/24/2023 for Pre-Operative Bariatric Surgery Education at [facility]    Surgery date: 11/14/2023 Surgery type: RYGB  Anthropometrics  Start weight at [facility]: 266.8 lbs (date: 07/15/2023)  Height: 64 in Weight today: 269.3 lb BMI: 46.23 kg/m2     Clinical  Medical hx: asthma, cancer, crohn's/colitis, obesity Medications: levothyroxine , liothyronine, albuterol  as needed, famotidine  Labs: chol 205; triglycerides 179; TSH 0.336; iron  saturation 14; A1c 5.9; Vit D 22.7 Notable signs/symptoms: none noted Any previous deficiencies? No  The following the learning objectives were met by the patient during this course: Identify Pre-Op Dietary Goals and will begin 2 weeks pre-operatively Identify appropriate sources of fluids and proteins  State protein recommendations and appropriate sources pre and post-operatively Identify Post-Operative Dietary Goals and will follow for 2 weeks post-operatively Identify appropriate multivitamin and calcium sources Describe the need for physical activity post-operatively and will follow MD recommendations State when to call healthcare provider regarding medication questions or post-operative complications When having a diagnosis of diabetes understanding hypoglycemia symptoms and the inclusion of 1 complex carbohydrate per meal  Handouts given during class include: Pre-Op Bariatric Surgery Diet Handout Protein Shake Handout Post-Op Bariatric Surgery Nutrition Handout [facility] Program Information Flyer Support Group Information Flyer [facility] Bariatric Supplements Price List  Follow-Up Plan: Patient will follow-up at [facility] 2 weeks post operatively for diet advancement per MD.

## 2023-10-24 NOTE — Patient Instructions (Signed)
 SURGICAL WAITING ROOM VISITATION  Patients having surgery or a procedure may have no more than 2 support people in the waiting area - these visitors may rotate.    Children under the age of 78 must have an adult with them who is not the patient.  Visitors with respiratory illnesses are discouraged from visiting and should remain at home.  If the patient needs to stay at the hospital during part of their recovery, the visitor guidelines for inpatient rooms apply. Pre-op nurse will coordinate an appropriate time for 1 support person to accompany patient in pre-op.  This support person may not rotate.    Please refer to the [facility] website for the visitor guidelines for Inpatients (after your surgery is over and you are in a regular room).       Your procedure is scheduled on: 11/14/23   Report to [facility] Main Entrance    Report to admitting at 11:15 AM   Call this number if you have problems the morning of surgery [phone]   Do not eat food :After 6PM  11/13/23   After Midnight you may have the following liquids until  10:30 AM DAY OF SURGERY  Water  Non-Citrus Juices (without pulp, NO RED-Apple, White grape, White cranberry) Black Coffee (NO MILK/CREAM OR CREAMERS, sugar ok)  Clear Tea (NO MILK/CREAM OR CREAMERS, sugar ok) regular and decaf                             Plain Jell-O (NO RED)                                           Fruit ices (not with fruit pulp, NO RED)                                     Popsicles (NO RED)                                                               Sports drinks like Gatorade (NO RED)                  The day of surgery:  Drink ONE (1) Pre-Surgery  G2 at 10:30 AM the morning of surgery. Drink in one sitting. Do not sip.  This drink was given to you during your hospital  pre-op appointment visit. Nothing else to drink after completing the  Pre-Surgery G2.          If you have questions, please contact your surgeon's  office.   FOLLOW BOWEL PREP AND ANY ADDITIONAL PRE OP INSTRUCTIONS YOU RECEIVED FROM YOUR SURGEON'S OFFICE!!!     Oral Hygiene is also important to reduce your risk of infection.                                    Remember - BRUSH YOUR TEETH THE MORNING OF SURGERY WITH YOUR REGULAR TOOTHPASTE   Stop all vitamins and herbal supplements 7  days before surgery.   Take these medicines the morning of surgery with A SIP OF WATER : famotidine (pepcid ), gabapentin , synthroid , loniten (monoxidil), Montelukast(singulair), Pantoprazole (protonix ), Inhalers.  DO NOT TAKE ANY ORAL DIABETIC MEDICATIONS DAY OF YOUR SURGERY  Bring CPAP mask and tubing day of surgery.                              You may not have any metal on your body including hair pins, jewelry, and body piercing             Do not wear make-up, lotions, powders, perfumes/cologne, or deodorant  Do not wear nail polish including gel and S&S, artificial/acrylic nails, or any other type of covering on natural nails including finger and toenails. If you have artificial nails, gel coating, etc. that needs to be removed by a nail salon please have this removed prior to surgery or surgery may need to be canceled/ delayed if the surgeon/ anesthesia feels like they are unable to be safely monitored.   Do not shave  48 hours prior to surgery.    Do not bring valuables to the hospital. [facility] IS NOT             RESPONSIBLE   FOR VALUABLES.   Contacts, glasses, dentures or bridgework may not be worn into surgery.   Bring small overnight bag day of surgery.   DO NOT BRING YOUR HOME MEDICATIONS TO THE HOSPITAL. PHARMACY WILL DISPENSE MEDICATIONS LISTED ON YOUR MEDICATION LIST TO YOU DURING YOUR ADMISSION IN THE HOSPITAL!    Patients discharged on the day of surgery will not be allowed to drive home.  Someone NEEDS to stay with you for the first 24 hours after anesthesia.   Special Instructions: Bring a copy of your healthcare power of  attorney and living will documents the day of surgery if you haven't scanned them before.              Please read over the following fact sheets you were given: IF YOU HAVE QUESTIONS ABOUT YOUR PRE-OP INSTRUCTIONS PLEASE CALL [phone] Linda Ware   If you received a COVID test during your pre-op visit  it is requested that you wear a mask when out in public, stay away from anyone that may not be feeling well and notify your surgeon if you develop symptoms. If you test positive for Covid or have been in contact with anyone that has tested positive in the last 10 days please notify you surgeon.    [place] - Preparing for Surgery Before surgery, you can play an important role.  Because skin is not sterile, your skin needs to be as free of germs as possible.  You can reduce the number of germs on your skin by washing with CHG (chlorahexidine gluconate) soap before surgery.  CHG is an antiseptic cleaner which kills germs and bonds with the skin to continue killing germs even after washing. Please DO NOT use if you have an allergy to CHG or antibacterial soaps.  If your skin becomes reddened/irritated stop using the CHG and inform your nurse when you arrive at Short Stay. Do not shave (including legs and underarms) for at least 48 hours prior to the first CHG shower.  You may shave your face/neck.  Please follow these instructions carefully:  1.  Shower with CHG Soap the night before surgery and the  morning of surgery.  2.  If you choose to wash  your hair, wash your hair first as usual with your normal  shampoo.  3.  After you shampoo, rinse your hair and body thoroughly to remove the shampoo.                             4.  Use CHG as you would any other liquid soap.  You can apply chg directly to the skin and wash.  Gently with a scrungie or clean washcloth.  5.  Apply the CHG Soap to your body ONLY FROM THE NECK DOWN.   Do   not use on face/ open                           Wound or open sores.  Avoid contact with eyes, ears mouth and   genitals (private parts).                       Wash face,  Genitals (private parts) with your normal soap.             6.  Wash thoroughly, paying special attention to the area where your    surgery  will be performed.  7.  Thoroughly rinse your body with warm water  from the neck down.  8.  DO NOT shower/wash with your normal soap after using and rinsing off the CHG Soap.                9.  Pat yourself dry with a clean towel.            10.  Wear clean pajamas.            11.  Place clean sheets on your bed the night of your first shower and do not  sleep with pets. Day of Surgery : Do not apply any lotions/deodorants the morning of surgery.  Please wear clean clothes to the hospital/surgery center.  FAILURE TO FOLLOW THESE INSTRUCTIONS MAY RESULT IN THE CANCELLATION OF YOUR SURGERY  ________________________________________________________________________ WHAT IS A BLOOD TRANSFUSION? Blood Transfusion Information  A transfusion is the replacement of blood or some of its parts. Blood is made up of multiple cells which provide different functions. Red blood cells carry oxygen and are used for blood loss replacement. White blood cells fight against infection. Platelets control bleeding. Plasma helps clot blood. Other blood products are available for specialized needs, such as hemophilia or other clotting disorders. BEFORE THE TRANSFUSION  Who gives blood for transfusions?  Healthy volunteers who are fully evaluated to make sure their blood is safe. This is blood bank blood. Transfusion therapy is the safest it has ever been in the practice of medicine. Before blood is taken from a donor, a complete history is taken to make sure that person has no history of diseases nor engages in risky social behavior (examples are intravenous drug use or sexual activity with multiple partners). The donor's travel history is screened to minimize risk of transmitting  infections, such as malaria. The donated blood is tested for signs of infectious diseases, such as HIV and hepatitis. The blood is then tested to be sure it is compatible with you in order to minimize the chance of a transfusion reaction. If you or a relative donates blood, this is often done in anticipation of surgery and is not appropriate for emergency situations. It takes many days to process the donated blood. RISKS  AND COMPLICATIONS Although transfusion therapy is very safe and saves many lives, the main dangers of transfusion include:  Getting an infectious disease. Developing a transfusion reaction. This is an allergic reaction to something in the blood you were given. Every precaution is taken to prevent this. The decision to have a blood transfusion has been considered carefully by your caregiver before blood is given. Blood is not given unless the benefits outweigh the risks. AFTER THE TRANSFUSION Right after receiving a blood transfusion, you will usually feel much better and more energetic. This is especially true if your red blood cells have gotten low (anemic). The transfusion raises the level of the red blood cells which carry oxygen, and this usually causes an energy increase. The nurse administering the transfusion will monitor you carefully for complications. HOME CARE INSTRUCTIONS  No special instructions are needed after a transfusion. You may find your energy is better. Speak with your caregiver about any limitations on activity for underlying diseases you may have. SEEK MEDICAL CARE IF:  Your condition is not improving after your transfusion. You develop redness or irritation at the intravenous (IV) site. SEEK IMMEDIATE MEDICAL CARE IF:  Any of the following symptoms occur over the next 12 hours: Shaking chills. You have a temperature by mouth above 102 F (38.9 C), not controlled by medicine. Chest, back, or muscle pain. People around you feel you are not acting correctly  or are confused. Shortness of breath or difficulty breathing. Dizziness and fainting. You get a rash or develop hives. You have a decrease in urine output. Your urine turns a dark color or changes to pink, red, or brown. Any of the following symptoms occur over the next 10 days: You have a temperature by mouth above 102 F (38.9 C), not controlled by medicine. Shortness of breath. Weakness after normal activity. The white part of the eye turns yellow (jaundice). You have a decrease in the amount of urine or are urinating less often. Your urine turns a dark color or changes to pink, red, or brown.   Aaron AasMORNING OF SURGERY DRINK:   DRINK 1 G2 drink BEFORE YOU LEAVE HOME, DRINK ALL OF THE  G2 DRINK AT ONE TIME.   NO SOLID FOOD AFTER 600 PM THE NIGHT BEFORE YOUR SURGERY. YOU MAY DRINK CLEAR FLUIDS. THE G2 DRINK YOU DRINK BEFORE YOU LEAVE HOME WILL BE THE LAST FLUIDS YOU DRINK BEFORE SURGERY.  PAIN IS EXPECTED AFTER SURGERY AND WILL NOT BE COMPLETELY ELIMINATED. AMBULATION AND TYLENOL  WILL HELP REDUCE INCISIONAL AND GAS PAIN. MOVEMENT IS KEY!  YOU ARE EXPECTED TO BE OUT OF BED WITHIN 4 HOURS OF ADMISSION TO YOUR PATIENT ROOM.  SITTING IN THE RECLINER THROUGHOUT THE DAY IS IMPORTANT FOR DRINKING FLUIDS AND MOVING GAS THROUGHOUT THE GI TRACT.  COMPRESSION STOCKINGS SHOULD BE WORN [facility] STAY UNLESS YOU ARE WALKING.   INCENTIVE SPIROMETER SHOULD BE USED EVERY HOUR WHILE AWAKE TO DECREASE POST-OPERATIVE COMPLICATIONS SUCH AS PNEUMONIA.  WHEN DISCHARGED HOME, IT IS IMPORTANT TO CONTINUE TO WALK EVERY HOUR AND USE THE INCENTIVE SPIROMETER EVERY HOUR.

## 2023-10-24 NOTE — Progress Notes (Addendum)
 COVID Vaccine received:  [x]  No []  Yes Date of any COVID positive Test in last 90 days: no PCP - Devere Mock MD Cardiologist - n/a  Chest x-ray - 07/14/23 Epic EKG -  07/14/23 Epic Stress Test -  ECHO -  Cardiac Cath -   Bowel Prep - [x]  No  []   Yes ______  Pacemaker / ICD device [x]  No []  Yes   Spinal Cord Stimulator:[x]  No []  Yes       History of Sleep Apnea? [x]  No []  Yes   CPAP used?- [x]  No []  Yes    Does the patient monitor blood sugar?          [x]  No []  Yes  []  N/A  Patient has: []  NO Hx DM   [x]  Pre-DM                 []  DM1  []   DM2 Does patient have a [employer] or Dexacom? [x]  No []  Yes   Fasting Blood Sugar Ranges-  Checks Blood Sugar __0___ times a day  GLP1 agonist / usual dose -no  GLP1 instructions:  SGLT-2 inhibitors / usual dose - no SGLT-2 instructions:   Blood Thinner / Instructions:no Aspirin Instructions:no  Comments:   Activity level: Patient is able to climb a flight of stairs without difficulty; [x]  No CP  [x]  No SOB,  Patient can  perform ADLs without assistance.   Anesthesia review: Murmur when pt. Was young. No problems since.   Patient denies shortness of breath, fever, cough and chest pain at PAT appointment.  Patient verbalized understanding and agreement to the Pre-Surgical Instructions that were given to them at this PAT appointment. Patient was also educated of the need to review these PAT instructions again prior to his/her surgery.I reviewed the appropriate phone numbers to call if they have any and questions or concerns.

## 2023-10-27 ENCOUNTER — Encounter (HOSPITAL_COMMUNITY)
Admission: RE | Admit: 2023-10-27 | Discharge: 2023-10-27 | Disposition: A | Discharge status: Home / Self Care | Source: Ambulatory Visit | Attending: General Surgery | Admitting: General Surgery

## 2023-10-27 ENCOUNTER — Other Ambulatory Visit: Payer: Self-pay

## 2023-10-27 ENCOUNTER — Encounter (HOSPITAL_COMMUNITY): Payer: Self-pay | Admitting: *Deleted

## 2023-10-27 VITALS — BP 153/75 | HR 71 | Temp 98.2°F | Resp 18 | Ht 64.0 in | Wt 266.0 lb

## 2023-10-27 DIAGNOSIS — Z01812 Encounter for preprocedural laboratory examination: Secondary | ICD-10-CM | POA: Diagnosis present

## 2023-10-27 DIAGNOSIS — Z01818 Encounter for other preprocedural examination: Secondary | ICD-10-CM

## 2023-10-27 HISTORY — DX: Personal history of other diseases of the digestive system: Z87.19

## 2023-10-27 LAB — CBC WITH DIFFERENTIAL/PLATELET
Abs Immature Granulocytes: 0.03 10*3/uL (ref 0.00–0.07)
Basophils Absolute: 0.1 10*3/uL (ref 0.0–0.1)
Basophils Relative: 1 %
Eosinophils Absolute: 0.4 10*3/uL (ref 0.0–0.5)
Eosinophils Relative: 4 %
HCT: 39.9 % (ref 36.0–46.0)
Hemoglobin: 11.4 g/dL — ABNORMAL LOW (ref 12.0–15.0)
Immature Granulocytes: 0 %
Lymphocytes Relative: 21 %
Lymphs Abs: 2.2 10*3/uL (ref 0.7–4.0)
MCH: 24.3 pg — ABNORMAL LOW (ref 26.0–34.0)
MCHC: 28.6 g/dL — ABNORMAL LOW (ref 30.0–36.0)
MCV: 85.1 fL (ref 80.0–100.0)
Monocytes Absolute: 0.5 10*3/uL (ref 0.1–1.0)
Monocytes Relative: 5 %
Neutro Abs: 7.2 10*3/uL (ref 1.7–7.7)
Neutrophils Relative %: 69 %
Platelets: 278 10*3/uL (ref 150–400)
RBC: 4.69 MIL/uL (ref 3.87–5.11)
RDW: 15.3 % (ref 11.5–15.5)
WBC: 10.3 10*3/uL (ref 4.0–10.5)
nRBC: 0 % (ref 0.0–0.2)

## 2023-10-27 LAB — TYPE AND SCREEN
ABO/RH(D): A POS
Antibody Screen: NEGATIVE

## 2023-11-14 ENCOUNTER — Encounter (HOSPITAL_COMMUNITY)
Admission: RE | Disposition: A | Discharge status: Home / Self Care | Payer: Self-pay | Source: Ambulatory Visit | Attending: General Surgery

## 2023-11-14 ENCOUNTER — Inpatient Hospital Stay (HOSPITAL_COMMUNITY)
Admission: RE | Admit: 2023-11-14 | Discharge: 2023-11-17 | DRG: 620 | Disposition: A | Discharge status: Home / Self Care | Source: Ambulatory Visit | Attending: General Surgery | Admitting: General Surgery

## 2023-11-14 ENCOUNTER — Other Ambulatory Visit: Payer: Self-pay

## 2023-11-14 ENCOUNTER — Inpatient Hospital Stay (HOSPITAL_COMMUNITY): Admitting: Anesthesiology

## 2023-11-14 ENCOUNTER — Encounter (HOSPITAL_COMMUNITY): Payer: Self-pay | Admitting: General Surgery

## 2023-11-14 DIAGNOSIS — I10 Essential (primary) hypertension: Secondary | ICD-10-CM | POA: Diagnosis present

## 2023-11-14 DIAGNOSIS — E66813 Obesity, class 3: Principal | ICD-10-CM | POA: Diagnosis present

## 2023-11-14 DIAGNOSIS — Z01818 Encounter for other preprocedural examination: Secondary | ICD-10-CM

## 2023-11-14 DIAGNOSIS — Z79890 Hormone replacement therapy (postmenopausal): Secondary | ICD-10-CM

## 2023-11-14 DIAGNOSIS — R066 Hiccough: Secondary | ICD-10-CM | POA: Diagnosis not present

## 2023-11-14 DIAGNOSIS — N939 Abnormal uterine and vaginal bleeding, unspecified: Secondary | ICD-10-CM | POA: Diagnosis present

## 2023-11-14 DIAGNOSIS — G4733 Obstructive sleep apnea (adult) (pediatric): Secondary | ICD-10-CM | POA: Diagnosis present

## 2023-11-14 DIAGNOSIS — K219 Gastro-esophageal reflux disease without esophagitis: Secondary | ICD-10-CM | POA: Diagnosis present

## 2023-11-14 DIAGNOSIS — K760 Fatty (change of) liver, not elsewhere classified: Secondary | ICD-10-CM | POA: Diagnosis present

## 2023-11-14 DIAGNOSIS — Z885 Allergy status to narcotic agent status: Secondary | ICD-10-CM

## 2023-11-14 DIAGNOSIS — K436 Other and unspecified ventral hernia with obstruction, without gangrene: Secondary | ICD-10-CM | POA: Diagnosis present

## 2023-11-14 DIAGNOSIS — N812 Incomplete uterovaginal prolapse: Secondary | ICD-10-CM | POA: Diagnosis present

## 2023-11-14 DIAGNOSIS — G4710 Hypersomnia, unspecified: Secondary | ICD-10-CM | POA: Diagnosis present

## 2023-11-14 DIAGNOSIS — E039 Hypothyroidism, unspecified: Secondary | ICD-10-CM | POA: Diagnosis not present

## 2023-11-14 DIAGNOSIS — R7303 Prediabetes: Secondary | ICD-10-CM | POA: Diagnosis present

## 2023-11-14 DIAGNOSIS — Z85850 Personal history of malignant neoplasm of thyroid: Secondary | ICD-10-CM | POA: Diagnosis not present

## 2023-11-14 DIAGNOSIS — K449 Diaphragmatic hernia without obstruction or gangrene: Secondary | ICD-10-CM | POA: Diagnosis present

## 2023-11-14 DIAGNOSIS — J4520 Mild intermittent asthma, uncomplicated: Secondary | ICD-10-CM | POA: Diagnosis present

## 2023-11-14 DIAGNOSIS — Z881 Allergy status to other antibiotic agents status: Secondary | ICD-10-CM

## 2023-11-14 DIAGNOSIS — K5710 Diverticulosis of small intestine without perforation or abscess without bleeding: Secondary | ICD-10-CM | POA: Diagnosis present

## 2023-11-14 DIAGNOSIS — K2100 Gastro-esophageal reflux disease with esophagitis, without bleeding: Secondary | ICD-10-CM

## 2023-11-14 DIAGNOSIS — Z6841 Body Mass Index (BMI) 40.0 and over, adult: Secondary | ICD-10-CM

## 2023-11-14 DIAGNOSIS — Z79899 Other long term (current) drug therapy: Secondary | ICD-10-CM

## 2023-11-14 DIAGNOSIS — F32A Depression, unspecified: Secondary | ICD-10-CM | POA: Diagnosis present

## 2023-11-14 DIAGNOSIS — R1310 Dysphagia, unspecified: Secondary | ICD-10-CM | POA: Diagnosis present

## 2023-11-14 DIAGNOSIS — K430 Incisional hernia with obstruction, without gangrene: Secondary | ICD-10-CM | POA: Diagnosis present

## 2023-11-14 DIAGNOSIS — Z7951 Long term (current) use of inhaled steroids: Secondary | ICD-10-CM | POA: Diagnosis not present

## 2023-11-14 HISTORY — PX: LAPAROSCOPIC SMALL BOWEL RESECTION: SHX5929

## 2023-11-14 HISTORY — PX: LAPAROSCOPIC ROUX-EN-Y GASTRIC BYPASS WITH HIATAL HERNIA REPAIR: SHX6513

## 2023-11-14 HISTORY — PX: HIATAL HERNIA REPAIR: SHX195

## 2023-11-14 HISTORY — PX: UPPER GI ENDOSCOPY: SHX6162

## 2023-11-14 LAB — COMPREHENSIVE METABOLIC PANEL WITH GFR
ALT: 15 U/L (ref 0–44)
AST: 18 U/L (ref 15–41)
Albumin: 4 g/dL (ref 3.5–5.0)
Alkaline Phosphatase: 71 U/L (ref 38–126)
Anion gap: 10 (ref 5–15)
BUN: 15 mg/dL (ref 6–20)
CO2: 22 mmol/L (ref 22–32)
Calcium: 9.2 mg/dL (ref 8.9–10.3)
Chloride: 107 mmol/L (ref 98–111)
Creatinine, Ser: 0.78 mg/dL (ref 0.44–1.00)
GFR, Estimated: >60 mL/min (ref >60)
Glucose, Bld: 92 mg/dL (ref 70–99)
Potassium: 3.9 mmol/L (ref 3.5–5.1)
Sodium: 139 mmol/L (ref 135–145)
Total Bilirubin: 0.6 mg/dL (ref 0.0–1.2)
Total Protein: 7.6 g/dL (ref 6.5–8.1)

## 2023-11-14 LAB — CBC
HCT: 38.8 % (ref 36.0–46.0)
Hemoglobin: 11.8 g/dL — ABNORMAL LOW (ref 12.0–15.0)
MCH: 24.2 pg — ABNORMAL LOW (ref 26.0–34.0)
MCHC: 30.4 g/dL (ref 30.0–36.0)
MCV: 79.5 fL — ABNORMAL LOW (ref 80.0–100.0)
Platelets: 295 K/uL (ref 150–400)
RBC: 4.88 MIL/uL (ref 3.87–5.11)
RDW: 15.3 % (ref 11.5–15.5)
WBC: 17.7 K/uL — ABNORMAL HIGH (ref 4.0–10.5)
nRBC: 0 % (ref 0.0–0.2)

## 2023-11-14 LAB — TYPE AND SCREEN
ABO/RH(D): A POS
Antibody Screen: NEGATIVE

## 2023-11-14 SURGERY — CREATION, GASTRIC BYPASS, LAPAROSCOPIC, USING ROUX-EN-Y GASTROENTEROSTOMY, WITH HIATAL HERNIA REPAIR
Anesthesia: General

## 2023-11-14 MED ORDER — ROCURONIUM BROMIDE 100 MG/10ML IV SOLN
INTRAVENOUS | Status: DC | PRN
  Administered 2023-11-14: 70 mg via INTRAVENOUS
  Administered 2023-11-14 (×2): 20 mg via INTRAVENOUS

## 2023-11-14 MED ORDER — EPHEDRINE 5 MG/ML INJ
INTRAVENOUS | Status: AC
  Filled 2023-11-14: qty 5

## 2023-11-14 MED ORDER — SCOPOLAMINE 1 MG/3DAYS TD PT72
1.0000 | MEDICATED_PATCH | TRANSDERMAL | Status: DC
  Administered 2023-11-14: 1.5 mg via TRANSDERMAL
  Filled 2023-11-14: qty 1

## 2023-11-14 MED ORDER — HYDROMORPHONE HCL 2 MG/ML IJ SOLN
INTRAMUSCULAR | Status: AC
Start: 2023-11-14 — End: 2023-11-14
  Filled 2023-11-14: qty 1

## 2023-11-14 MED ORDER — ORAL CARE MOUTH RINSE: 15.0000 mL | Freq: Once | OROMUCOSAL | Status: DC

## 2023-11-14 MED ORDER — ACETAMINOPHEN 500 MG PO TABS
1000.0000 mg | ORAL_TABLET | ORAL | Status: AC
  Administered 2023-11-14: 1000 mg via ORAL
  Filled 2023-11-14: qty 2

## 2023-11-14 MED ORDER — ALBUTEROL SULFATE (2.5 MG/3ML) 0.083% IN NEBU: 2.5000 mg | INHALATION_SOLUTION | Freq: Four times a day (QID) | RESPIRATORY_TRACT | Status: DC | PRN

## 2023-11-14 MED ORDER — HYDROMORPHONE HCL 1 MG/ML IJ SOLN
0.5000 mg | INTRAMUSCULAR | Status: DC | PRN
  Administered 2023-11-16: 0.5 mg via INTRAVENOUS
  Filled 2023-11-14: qty 0.5

## 2023-11-14 MED ORDER — SODIUM CHLORIDE 0.9 % IV SOLN
2.0000 g | INTRAVENOUS | Status: AC
  Administered 2023-11-14: 2 g via INTRAVENOUS
  Filled 2023-11-14: qty 2

## 2023-11-14 MED ORDER — 0.9 % SODIUM CHLORIDE (POUR BTL) OPTIME
TOPICAL | Status: DC | PRN
Start: 2023-11-14 — End: 2023-11-14
  Administered 2023-11-14: 1000 mL

## 2023-11-14 MED ORDER — KETAMINE HCL 50 MG/5ML IJ SOSY
PREFILLED_SYRINGE | INTRAMUSCULAR | Status: AC
  Filled 2023-11-14: qty 5

## 2023-11-14 MED ORDER — LACTATED RINGERS IV SOLN: INTRAVENOUS | Status: DC | PRN

## 2023-11-14 MED ORDER — HEPARIN SODIUM (PORCINE) 5000 UNIT/ML IJ SOLN
5000.0000 [IU] | INTRAMUSCULAR | Status: AC
  Administered 2023-11-14: 5000 [IU] via SUBCUTANEOUS
  Filled 2023-11-14: qty 1

## 2023-11-14 MED ORDER — CHLORHEXIDINE GLUCONATE 0.12 % MT SOLN: 15.0000 mL | Freq: Once | OROMUCOSAL | Status: DC

## 2023-11-14 MED ORDER — SUGAMMADEX SODIUM 200 MG/2ML IV SOLN
INTRAVENOUS | Status: DC | PRN
Start: 2023-11-14 — End: 2023-11-14
  Administered 2023-11-14: 200 mg via INTRAVENOUS

## 2023-11-14 MED ORDER — GLYCOPYRROLATE 0.2 MG/ML IJ SOLN
INTRAMUSCULAR | Status: DC | PRN
  Administered 2023-11-14: .2 mg via INTRAVENOUS

## 2023-11-14 MED ORDER — PHENYLEPHRINE 80 MCG/ML (10ML) SYRINGE FOR IV PUSH (FOR BLOOD PRESSURE SUPPORT)
PREFILLED_SYRINGE | INTRAVENOUS | Status: DC | PRN
  Administered 2023-11-14: 160 ug via INTRAVENOUS
  Administered 2023-11-14: 80 ug via INTRAVENOUS
  Administered 2023-11-14: 160 ug via INTRAVENOUS

## 2023-11-14 MED ORDER — HEPARIN SODIUM (PORCINE) 5000 UNIT/ML IJ SOLN
5000.0000 [IU] | Freq: Three times a day (TID) | INTRAMUSCULAR | Status: DC
  Administered 2023-11-14 – 2023-11-17 (×7): 5000 [IU] via SUBCUTANEOUS
  Filled 2023-11-14 (×7): qty 1

## 2023-11-14 MED ORDER — SIMETHICONE 80 MG PO CHEW
80.0000 mg | CHEWABLE_TABLET | Freq: Four times a day (QID) | ORAL | Status: DC | PRN
  Administered 2023-11-14 – 2023-11-15 (×2): 80 mg via ORAL
  Filled 2023-11-14 (×2): qty 1

## 2023-11-14 MED ORDER — BUPIVACAINE LIPOSOME 1.3 % IJ SUSP: 20.0000 mL | Freq: Once | INTRAMUSCULAR | Status: DC

## 2023-11-14 MED ORDER — DEXTROSE-SODIUM CHLORIDE 5-0.45 % IV SOLN: INTRAVENOUS | Status: AC

## 2023-11-14 MED ORDER — BUPIVACAINE HCL 0.25 % IJ SOLN
INTRAMUSCULAR | Status: DC | PRN
  Administered 2023-11-14: 60 mL

## 2023-11-14 MED ORDER — LEVOTHYROXINE SODIUM 75 MCG PO TABS
150.0000 ug | ORAL_TABLET | Freq: Every day | ORAL | Status: DC
  Administered 2023-11-15 – 2023-11-17 (×3): 150 ug via ORAL
  Filled 2023-11-14 (×3): qty 2

## 2023-11-14 MED ORDER — PROPOFOL 500 MG/50ML IV EMUL
INTRAVENOUS | Status: DC | PRN
  Administered 2023-11-14: 150 ug/kg/min via INTRAVENOUS

## 2023-11-14 MED ORDER — MIDAZOLAM HCL 5 MG/5ML IJ SOLN
INTRAMUSCULAR | Status: DC | PRN
  Administered 2023-11-14: 2 mg via INTRAVENOUS

## 2023-11-14 MED ORDER — FLUTICASONE FUROATE-VILANTEROL 200-25 MCG/ACT IN AEPB
1.0000 | INHALATION_SPRAY | Freq: Every day | RESPIRATORY_TRACT | Status: DC
  Administered 2023-11-15 – 2023-11-17 (×3): 1 via RESPIRATORY_TRACT
  Filled 2023-11-14: qty 28

## 2023-11-14 MED ORDER — LIDOCAINE HCL (PF) 2 % IJ SOLN
INTRAMUSCULAR | Status: AC
Start: 2023-11-14 — End: 2023-11-14
  Filled 2023-11-14: qty 5

## 2023-11-14 MED ORDER — EPHEDRINE SULFATE-NACL 50-0.9 MG/10ML-% IV SOSY
PREFILLED_SYRINGE | INTRAVENOUS | Status: DC | PRN
Start: 2023-11-14 — End: 2023-11-14
  Administered 2023-11-14: 10 mg via INTRAVENOUS
  Administered 2023-11-14: 5 mg via INTRAVENOUS

## 2023-11-14 MED ORDER — BUPIVACAINE HCL (PF) 0.25 % IJ SOLN
INTRAMUSCULAR | Status: AC
  Filled 2023-11-14: qty 60

## 2023-11-14 MED ORDER — DEXAMETHASONE SODIUM PHOSPHATE 10 MG/ML IJ SOLN
INTRAMUSCULAR | Status: DC | PRN
Start: 2023-11-14 — End: 2023-11-14
  Administered 2023-11-14: 10 mg via INTRAVENOUS

## 2023-11-14 MED ORDER — ONDANSETRON HCL 4 MG/2ML IJ SOLN
INTRAMUSCULAR | Status: DC | PRN
  Administered 2023-11-14: 4 mg via INTRAVENOUS

## 2023-11-14 MED ORDER — MIDAZOLAM HCL 2 MG/2ML IJ SOLN
INTRAMUSCULAR | Status: AC
  Filled 2023-11-14: qty 2

## 2023-11-14 MED ORDER — STERILE WATER FOR IRRIGATION IR SOLN
Status: DC | PRN
  Administered 2023-11-14: 1000 mL

## 2023-11-14 MED ORDER — HYDROMORPHONE HCL 1 MG/ML IJ SOLN
INTRAMUSCULAR | Status: DC | PRN
  Administered 2023-11-14 (×2): .5 mg via INTRAVENOUS

## 2023-11-14 MED ORDER — APREPITANT 40 MG PO CAPS
40.0000 mg | ORAL_CAPSULE | ORAL | Status: AC
  Administered 2023-11-14: 40 mg via ORAL
  Filled 2023-11-14: qty 1

## 2023-11-14 MED ORDER — ROCURONIUM BROMIDE 10 MG/ML (PF) SYRINGE
PREFILLED_SYRINGE | INTRAVENOUS | Status: AC
  Filled 2023-11-14: qty 10

## 2023-11-14 MED ORDER — LIDOCAINE HCL (CARDIAC) PF 100 MG/5ML IV SOSY
PREFILLED_SYRINGE | INTRAVENOUS | Status: DC | PRN
  Administered 2023-11-14: 60 mg via INTRAVENOUS

## 2023-11-14 MED ORDER — KETAMINE HCL 50 MG/5ML IJ SOSY
PREFILLED_SYRINGE | INTRAMUSCULAR | Status: DC | PRN
Start: 2023-11-14 — End: 2023-11-14
  Administered 2023-11-14: 30 mg via INTRAVENOUS

## 2023-11-14 MED ORDER — ACETAMINOPHEN 160 MG/5ML PO SOLN: 1000.0000 mg | Freq: Three times a day (TID) | ORAL | Status: DC

## 2023-11-14 MED ORDER — PROPOFOL 500 MG/50ML IV EMUL
INTRAVENOUS | Status: AC
  Filled 2023-11-14: qty 50

## 2023-11-14 MED ORDER — PHENYLEPHRINE 80 MCG/ML (10ML) SYRINGE FOR IV PUSH (FOR BLOOD PRESSURE SUPPORT)
PREFILLED_SYRINGE | INTRAVENOUS | Status: AC
  Filled 2023-11-14: qty 10

## 2023-11-14 MED ORDER — FENTANYL CITRATE (PF) 100 MCG/2ML IJ SOLN
INTRAMUSCULAR | Status: AC
  Filled 2023-11-14: qty 2

## 2023-11-14 MED ORDER — ALBUTEROL SULFATE HFA 108 (90 BASE) MCG/ACT IN AERS: 1.0000 | INHALATION_SPRAY | Freq: Four times a day (QID) | RESPIRATORY_TRACT | Status: DC | PRN

## 2023-11-14 MED ORDER — DEXAMETHASONE SODIUM PHOSPHATE 10 MG/ML IJ SOLN: 4.0000 mg | INTRAMUSCULAR | Status: DC

## 2023-11-14 MED ORDER — PROPOFOL 1000 MG/100ML IV EMUL
INTRAVENOUS | Status: AC
Start: 2023-11-14 — End: 2023-11-14
  Filled 2023-11-14: qty 100

## 2023-11-14 MED ORDER — FENTANYL CITRATE PF 50 MCG/ML IJ SOSY
PREFILLED_SYRINGE | INTRAMUSCULAR | Status: AC
  Filled 2023-11-14: qty 1

## 2023-11-14 MED ORDER — HYDROMORPHONE HCL 2 MG/ML IJ SOLN
INTRAMUSCULAR | Status: AC
  Filled 2023-11-14: qty 1

## 2023-11-14 MED ORDER — PROPOFOL 10 MG/ML IV BOLUS
INTRAVENOUS | Status: AC
  Filled 2023-11-14: qty 20

## 2023-11-14 MED ORDER — PROPOFOL 1000 MG/100ML IV EMUL
INTRAVENOUS | Status: AC
  Filled 2023-11-14: qty 100

## 2023-11-14 MED ORDER — ONDANSETRON HCL 4 MG/2ML IJ SOLN
4.0000 mg | INTRAMUSCULAR | Status: DC | PRN
  Administered 2023-11-15: 4 mg via INTRAVENOUS
  Filled 2023-11-14: qty 2

## 2023-11-14 MED ORDER — FAMOTIDINE IN NACL 20-0.9 MG/50ML-% IV SOLN
20.0000 mg | Freq: Two times a day (BID) | INTRAVENOUS | Status: DC
  Administered 2023-11-14 – 2023-11-17 (×6): 20 mg via INTRAVENOUS
  Filled 2023-11-14 (×6): qty 50

## 2023-11-14 MED ORDER — ACETAMINOPHEN 500 MG PO TABS
1000.0000 mg | ORAL_TABLET | Freq: Three times a day (TID) | ORAL | Status: DC
  Administered 2023-11-14 – 2023-11-17 (×7): 1000 mg via ORAL
  Filled 2023-11-14 (×7): qty 2

## 2023-11-14 MED ORDER — ENSURE MAX PROTEIN PO LIQD
2.0000 [oz_av] | ORAL | Status: DC
  Administered 2023-11-15 – 2023-11-17 (×3): 2 [oz_av] via ORAL

## 2023-11-14 MED ORDER — CHLORHEXIDINE GLUCONATE CLOTH 2 % EX PADS: 6.0000 | MEDICATED_PAD | Freq: Once | CUTANEOUS | Status: DC

## 2023-11-14 MED ORDER — GABAPENTIN 100 MG PO CAPS
200.0000 mg | ORAL_CAPSULE | Freq: Two times a day (BID) | ORAL | Status: DC
  Administered 2023-11-14 – 2023-11-17 (×5): 200 mg via ORAL
  Filled 2023-11-14 (×5): qty 2

## 2023-11-14 MED ORDER — DEXAMETHASONE SODIUM PHOSPHATE 10 MG/ML IJ SOLN
INTRAMUSCULAR | Status: AC
  Filled 2023-11-14: qty 1

## 2023-11-14 MED ORDER — HYDRALAZINE HCL 20 MG/ML IJ SOLN: 10.0000 mg | INTRAMUSCULAR | Status: DC | PRN

## 2023-11-14 MED ORDER — AMISULPRIDE (ANTIEMETIC) 5 MG/2ML IV SOLN: 10.0000 mg | Freq: Once | INTRAVENOUS | Status: DC | PRN

## 2023-11-14 MED ORDER — TRAMADOL HCL 50 MG PO TABS
50.0000 mg | ORAL_TABLET | Freq: Four times a day (QID) | ORAL | Status: DC | PRN
  Administered 2023-11-14: 50 mg via ORAL
  Filled 2023-11-14: qty 2

## 2023-11-14 MED ORDER — FENTANYL CITRATE (PF) 100 MCG/2ML IJ SOLN
INTRAMUSCULAR | Status: DC | PRN
  Administered 2023-11-14 (×2): 50 ug via INTRAVENOUS

## 2023-11-14 MED ORDER — LACTATED RINGERS IV SOLN: INTRAVENOUS | Status: DC

## 2023-11-14 MED ORDER — FENTANYL CITRATE PF 50 MCG/ML IJ SOSY
25.0000 ug | PREFILLED_SYRINGE | INTRAMUSCULAR | Status: DC | PRN
  Administered 2023-11-14: 50 ug via INTRAVENOUS

## 2023-11-14 MED ORDER — PROPOFOL 10 MG/ML IV BOLUS
INTRAVENOUS | Status: DC | PRN
  Administered 2023-11-14: 200 mg via INTRAVENOUS

## 2023-11-14 MED ORDER — ONDANSETRON HCL 4 MG/2ML IJ SOLN
INTRAMUSCULAR | Status: AC
  Filled 2023-11-14: qty 2

## 2023-11-14 SURGICAL SUPPLY — 63 items
BAG COUNTER SPONGE SURGICOUNT (BAG) ×2 IMPLANT
BENZOIN TINCTURE PRP APPL 2/3 (GAUZE/BANDAGES/DRESSINGS) IMPLANT
BLADE SURG SZ11 CARB STEEL (BLADE) ×2 IMPLANT
BNDG ADH 1X3 SHEER STRL LF (GAUZE/BANDAGES/DRESSINGS) IMPLANT
CABLE HIGH FREQUENCY MONO STRZ (ELECTRODE) IMPLANT
CHLORAPREP W/TINT 26 (MISCELLANEOUS) ×2 IMPLANT
CLIP APPLIE 5 13 M/L LIGAMAX5 (MISCELLANEOUS) IMPLANT
CLIP APPLIE ROT 10 11.4 M/L (STAPLE) IMPLANT
CLIP APPLIE ROT 13.4 12 LRG (CLIP) IMPLANT
COVER SURGICAL LIGHT HANDLE (MISCELLANEOUS) ×2 IMPLANT
DEVICE SUTURE ENDOST 10MM (ENDOMECHANICALS) ×2 IMPLANT
DRAIN CHANNEL 19F RND (DRAIN) IMPLANT
DRAIN PENROSE 0.25X18 (DRAIN) ×2 IMPLANT
ELECTRODE L-HOOK LAP 45CM DISP (ELECTROSURGICAL) ×2 IMPLANT
EVACUATOR SILICONE 100CC (DRAIN) IMPLANT
GAUZE 4X4 16PLY ~~LOC~~+RFID DBL (SPONGE) ×2 IMPLANT
GAUZE SPONGE 4X4 12PLY STRL (GAUZE/BANDAGES/DRESSINGS) IMPLANT
GLOVE BIOGEL PI IND STRL 7.0 (GLOVE) ×2 IMPLANT
GLOVE SURG SS PI 7.0 STRL IVOR (GLOVE) ×2 IMPLANT
GOWN STRL REUS W/ TWL LRG LVL3 (GOWN DISPOSABLE) ×2 IMPLANT
GOWN STRL REUS W/ TWL XL LVL3 (GOWN DISPOSABLE) IMPLANT
GRASPER SUT TROCAR 14GX15 (MISCELLANEOUS) IMPLANT
IRRIGATION SUCT STRKRFLW 2 WTP (MISCELLANEOUS) ×2 IMPLANT
KIT BASIN OR (CUSTOM PROCEDURE TRAY) ×2 IMPLANT
KIT GASTRIC LAVAGE 34FR ADT (SET/KITS/TRAYS/PACK) IMPLANT
KIT TURNOVER KIT A (KITS) ×2 IMPLANT
MARKER SKIN DUAL TIP RULER LAB (MISCELLANEOUS) ×2 IMPLANT
MAT PREVALON FULL STRYKER (MISCELLANEOUS) ×2 IMPLANT
NDL SPNL 22GX3.5 QUINCKE BK (NEEDLE) ×2 IMPLANT
NEEDLE SPNL 22GX3.5 QUINCKE BK (NEEDLE) ×2 IMPLANT
PACK CARDIOVASCULAR III (CUSTOM PROCEDURE TRAY) ×2 IMPLANT
PENCIL SMOKE EVACUATOR (MISCELLANEOUS) IMPLANT
RELOAD STAPLE 60 2.6 WHT THN (STAPLE) ×6 IMPLANT
RELOAD STAPLE 60 3.6 BLU REG (STAPLE) ×8 IMPLANT
RELOAD STAPLE 60 3.8 GOLD REG (STAPLE) IMPLANT
RELOAD STAPLER BLUE 60MM (STAPLE) ×12 IMPLANT
RELOAD STAPLER GOLD 60MM (STAPLE) IMPLANT
RELOAD STAPLER WHITE 60MM (STAPLE) ×8 IMPLANT
RELOAD SUT SNGL STCH ABSRB 2-0 (ENDOMECHANICALS) ×10 IMPLANT
RELOAD SUT SNGL STCH BLK 2-0 (ENDOMECHANICALS) ×12 IMPLANT
SCISSORS LAP 5X45 EPIX DISP (ENDOMECHANICALS) ×2 IMPLANT
SET TUBE SMOKE EVAC HIGH FLOW (TUBING) ×2 IMPLANT
SHEARS HARMONIC 45 ACE (MISCELLANEOUS) ×2 IMPLANT
SLEEVE Z-THREAD 12X100MM (TROCAR) IMPLANT
SLEEVE Z-THREAD 5X100MM (TROCAR) ×6 IMPLANT
SOLUTION ANTFG W/FOAM PAD STRL (MISCELLANEOUS) ×2 IMPLANT
STAPLER ECHELON LONG 3000 60 (ENDOMECHANICALS) ×2 IMPLANT
STRIP CLOSURE SKIN 1/2X4 (GAUZE/BANDAGES/DRESSINGS) IMPLANT
SUT ETHIBOND 0 36 GRN (SUTURE) IMPLANT
SUT ETHIBOND 2 0 SH 36X2 (SUTURE) IMPLANT
SUT ETHILON 2 0 PS N (SUTURE) IMPLANT
SUT MNCRL AB 4-0 PS2 18 (SUTURE) ×2 IMPLANT
SUT SILK 0 SH 30 (SUTURE) IMPLANT
SUT VICRYL 0 TIES 12 18 (SUTURE) IMPLANT
SUTURE RELOAD END STTCH 2 48X1 (ENDOMECHANICALS) ×10 IMPLANT
SUTURE RELOAD ENDO STITCH 2.0 (ENDOMECHANICALS) ×14 IMPLANT
SYR 20ML LL LF (SYRINGE) ×2 IMPLANT
SYR 50ML LL SCALE MARK (SYRINGE) ×2 IMPLANT
SYSTEM BAG RETRIEVAL 10MM (BASKET) IMPLANT
TOWEL OR 17X26 10 PK STRL BLUE (TOWEL DISPOSABLE) ×2 IMPLANT
TROCAR Z THREAD OPTICAL 12X100 (TROCAR) ×2 IMPLANT
TROCAR Z-THREAD OPTICAL 5X100M (TROCAR) ×2 IMPLANT
TUBING CONNECTING 10 (TUBING) ×2 IMPLANT

## 2023-11-14 NOTE — Progress Notes (Signed)
    Name: Linda Ware                Patient MRN: [id number]  DOA: 11/14/2023   Patient seen in room SS08  Patient sitting upright on stretcher, alert and oriented. Patient is here to receive gastrointestinal bypass and hiatal hernia repair with Dr. Stevie on 11/14/23. Reminded patient of bariatric goals for discharge and provided educational handout to support appropriate progression.    Vital signs in last 24 hours:     11/14/2023   11:46 AM 11/14/2023   11:29 AM 10/27/2023    9:04 AM  Vitals with BMI  Height 5' 4  5' 4  Weight 256 lbs 10 oz  266 lbs  BMI 44.02  45.64  Systolic  152 153  Diastolic  78 75  Pulse  64 71     Prior Lab Results:   Latest Reference Range & Units 10/27/23 08:57  WBC 4.0 - 10.5 K/uL 10.3  RBC 3.87 - 5.11 MIL/uL 4.69  Hemoglobin 12.0 - 15.0 g/dL 88.5 (L)  HCT 63.9 - 53.9 % 39.9  MCV 80.0 - 100.0 fL 85.1  MCH 26.0 - 34.0 pg 24.3 (L)  MCHC 30.0 - 36.0 g/dL 71.3 (L)  RDW 88.4 - 84.4 % 15.3  Platelets 150 - 400 K/uL 278  nRBC 0.0 - 0.2 % 0.0  (L): Data is abnormally low   Assessment / Plan Recommendations:  Reminded patient of QI Goals for Discharge document with patient including ambulation in halls, Incentive Spirometry use every hour, and oral care.  Also discussed pain and nausea control.  Encouraged head of bed 30 degree alarm upon arrival to bariatric unit after surgery. BSTOP education provided including BSTOP information guide, Guide for Pain Management after your Bariatric Procedure.  Diet progression education provided including Bariatric Surgery Post-Op Food Plan Phase 1: Liquids.  Questions answered.  No further questions at this time.   Thank you,  Roseann Medley, RN, MSN Bariatric Nurse Coordinator [phone] (office)

## 2023-11-14 NOTE — Anesthesia Postprocedure Evaluation (Signed)
 Anesthesia Post Note  Patient: Linda Ware  Procedure(s) Performed: CREATION, GASTRIC BYPASS, LAPAROSCOPIC, USING ROUX-EN-Y GASTROENTEROSTOMY, WITH HIATAL HERNIA REPAIR ENDOSCOPY, UPPER GI TRACT REPAIR, HERNIA, HIATAL EXCISION, SMALL INTESTINE, LAPAROSCOPIC     Patient location during evaluation: PACU Anesthesia Type: General Level of consciousness: awake and alert Pain management: pain level controlled Vital Signs Assessment: post-procedure vital signs reviewed and stable Respiratory status: spontaneous breathing, nonlabored ventilation, respiratory function stable and patient connected to nasal cannula oxygen Cardiovascular status: blood pressure returned to baseline and stable Postop Assessment: no apparent nausea or vomiting Anesthetic complications: no   No notable events documented.  Last Vitals:  Vitals:   11/14/23 1937 11/14/23 2030  BP: 133/63 137/80  Pulse: 70 72  Resp: 16 15  Temp: 36.5 C (!) 36.4 C  SpO2: 94% 98%    Last Pain:  Vitals:   11/14/23 2030  TempSrc: Oral  PainSc:                  Wilford Merryfield E

## 2023-11-14 NOTE — Anesthesia Preprocedure Evaluation (Signed)
 Anesthesia Evaluation  Patient identified by MRN, date of birth, ID band Patient awake    Reviewed: Allergy & Precautions, NPO status , Patient's Chart, lab work & pertinent test results  Airway Mallampati: II  TM Distance: >3 FB Neck ROM: Full    Dental  (+) Dental Advisory Given   Pulmonary asthma , sleep apnea , former smoker   breath sounds clear to auscultation       Cardiovascular negative cardio ROS  Rhythm:Regular Rate:Normal     Neuro/Psych negative neurological ROS     GI/Hepatic Neg liver ROS, hiatal hernia, PUD,GERD  Medicated and Controlled,,  Endo/Other  Hypothyroidism  Class 3 obesity  Renal/GU negative Renal ROS     Musculoskeletal   Abdominal   Peds  Hematology  (+) Blood dyscrasia, anemia   Anesthesia Other Findings   Reproductive/Obstetrics                              Anesthesia Physical Anesthesia Plan  ASA: 3  Anesthesia Plan: General   Post-op Pain Management: Tylenol  PO (pre-op)* and Ketamine  IV*   Induction: Intravenous  PONV Risk Score and Plan: 4 or greater and Dexamethasone , Ondansetron , Midazolam , Scopolamine  patch - Pre-op, TIVA, Propofol  infusion and Treatment may vary due to age or medical condition  Airway Management Planned: Oral ETT  Additional Equipment: None  Intra-op Plan:   Post-operative Plan: Extubation in OR  Informed Consent: I have reviewed the patients History and Physical, chart, labs and discussed the procedure including the risks, benefits and alternatives for the proposed anesthesia with the patient or authorized representative who has indicated his/her understanding and acceptance.     Dental advisory given  Plan Discussed with: CRNA  Anesthesia Plan Comments:         Anesthesia Quick Evaluation

## 2023-11-14 NOTE — Op Note (Signed)
 Preop Diagnosis: Obesity Class III  Postop Diagnosis: same  Procedure performed: laparoscopic Roux en Y gastric bypass  Assitant: Deward Foy  Indications:  The patient is a 53 y.o. year-old morbidly obese female who has been followed in the Bariatric Clinic as an outpatient. This patient was diagnosed with morbid obesity with a BMI of Body mass index is 44.05 kg/m. and significant co-morbidities including hypertension, GERD, and sleep apnea.  The patient was counseled extensively in the Bariatric Outpatient Clinic and after a thorough explanation of the risks and benefits of surgery (including death from complications, bowel leak, infection such as peritonitis and/or sepsis, internal hernia, bleeding, need for blood transfusion, bowel obstruction, organ failure, pulmonary embolus, deep venous thrombosis, wound infection, incisional hernia, skin breakdown, and others entailed on the consent form) and after a compliant diet and exercise program, the patient was scheduled for an elective laparoscopic gastric bypass.  Description of Operation:  Following informed consent, the patient was taken to the operating room and placed on the operating table in the supine position.  She had previously received prophylactic antibiotics and subcutaneous heparin  for DVT prophylaxis in the pre-op holding area.  After induction of general endotracheal anesthesia by the anesthesiologist, the patient underwent placement of sequential compression devices, Foley catheter and an oro-gastric tube.  A timeout was confirmed by the surgery and anesthesia teams.  The patient was adequately padded at all pressure points and placed on a footboard to prevent slippage from the OR table during extremes of position during surgery.  She underwent a routine sterile prep and drape of her entire abdomen.    Next, A transverse incision was made under the left subcostal area and a 5mm optical viewing trocar was introduced into the  peritoneal cavity. Pneumoperitoneum was applied with a high flow and low pressure. A laparoscope was inserted to confirm placement. A extraperitoneal block was then placed at the lateral abdominal wall using exparel  diluted with marcaine  . 5 additional trocars were placed: 1 5mm trocar to the left of the midline. 1 additional 5mm trocar in the left lateral area, 1 12mm trocar in the right mid abdomen, and 1 5mm trocar in the right subcostal area.  There was a small ventral hernia seen and evidence of previous umbilical hernia repair.  The greater omentum was flipped over the transverse colon and under the left lobe of the liver. The ligament of trietz was identified. 40cm of jejunum was measured starting from the ligament of Trietz.   At 40-50 cm from ligament of Trietz there were 2 large small bowel diverticula. Decision was made to remove this segment and use the ends as the normal division. The small intestine was divided with 60 mm echelon white loads and the mesentery was taken with harmonic scalpel.   100cm of jejunum was measured starting at the division. 2-0 silk was used to appose the biliary limb to the 100cm mark of jejunum in 2 places. Enterotomies were made in the biliary and common channels and a 60mm white load echelon stapler was used to create the J-J anastomosis. A 2-0 silk was used to appose the enterotomy edges and a 60mm white load echelon stapler was used to close the enterotomy. An anti-obstruction 2-0 silk suture was placed. Next, the mesenteric defect was closed with a 2-0 silk in running fashion.The J-J appeared patent and in neutral position.  Next, the omentum was divided using the Harmonic scalpel. The patient was placed in steep Reverse Trendelenberg position. A Nathanson retracted was  placed through a subxiphoid incision and used to retract the liver. The UGI showed a small hiatal hernia. Therefore, the pars flaccida was incised with harmonic scalpel. The stomach was reduced  but on dissection of the posterior crus there was a visible hernia with small sac. The sac was dissected free and 2 0 ethibond sutures placed in interrupted fashion. A calibration tube was passed to ensure appropriate size of the hiatus.  The fat pad over the fundus was incised to free the fundus.   Next, a position along the lesser curve 6cm from GE junction was identified. The pars flaccida was entered and the fat over the lesser curve divided to enter the lesser sac. Multiple 60mm blue load echelon stapler firings were peformed to create a 6cm pouch. The Roux limb was identified using the placed penrose and brought up to the stomach in antecolic fashion. The limb was inspected to ensure a neutral position.   Next cautery was used to create an enterotomy along the medial aspect of this suture line and Harmonic scalpel used to create gastotomy. A 60 mm blue load echelon stapler was then used to create a 25-30mm anastomosis. 2 2-0 vicryl sutures were used in running fashion to close the gastrotomy. Finally, a 2-0 vicryl suture was used to close an anterior layer of stomach and jejunum over the anastomosis in running fashion. The penrose was removed from the Roux limb. A 2-0 silk was used to appose the transverse mesocolon to the mesentery of the Roux limb.   The assistant then went and performed an upper endoscopy and leak test. No bubbles were seen and the pouch and limb distended appropriately. The limb and pouch were deflated, the endoscope was removed. Hemostasis was ensured. Pneumoperitoneum was evacuated, all ports were removed and all incisions closed with 4-0 monocryl suture in subcuticular fashion. Steristrips and bandaids were put in place for dressing. The patient awoke from anesthesia and was brought to pacu in stable condition. All counts were correct.  Specimens:  Small intestine  Estimated Blood Loss: 30 ml  Local Anesthesia: 60 ml Marcaine   Post-Op Plan:       Pain Management: PO,  prn      Antibiotics: Prophylactic      Anticoagulation: Prophylactic, Starting now      Post Op Studies/Consults: Not applicable      Intended Discharge: within 48h      Intended Outpatient Follow-Up: Two Week      Intended Outpatient Studies: Not Applicable      Other: Not Applicable   Herlene Righter Alysandra Lobue

## 2023-11-14 NOTE — Progress Notes (Signed)
 PHARMACY CONSULT FOR:  Risk Assessment for Post-Discharge VTE Following Bariatric Surgery  Procedure* laparoscopic Roux en Y gastric bypass   Sex F  Black race N  Age (years) 32  BMI (kg/m2) 44  Operation duration (minutes) 110 min  History of VTE requiring treatment* N  Hypercoagulable condition* N  Liver disorder* N  Pre-op venous stasis Y  Pre-op functional health status Independent  Previous foregut or bariatric surg N  Post-op surgical site infection N  Transfusion intra- or post-op* N  Unplanned readmission N  Unplanned reoperation N  GI perforation/leak/obstruction* N  *specific risk factors for portomesenteric venous thrombosis   Predicted probability of 30-day post-discharge VTE:    0.25 % (mild) to high risk depending on severity of venous stasis condition  - estimated using the [facility] Calculator    Recommendation for Discharge: Patient's risk is borderline depending on the severity of her leg venous insufficiency condition.  Informed Dr. Stevie that if her condition is deemed significant then she will need to be on Lovenox  40 mg SQ q12h x 2 weeks at discharge. Will defer decision to Dr. Stevie.     Linda Ware is a 53 y.o. female who underwent a laparoscopic Roux en Y gastric bypass on 11/14/23.   Case start: 1328 Case end: 1518   Allergies  Allergen Reactions   Codeine Nausea And Vomiting   Hydrocodone Nausea And Vomiting   Clindamycin Hives   Clindamycin Hcl Hives   Clindamycin/Lincomycin Rash    Patient Measurements: Height: 5' 4 (162.6 cm) Weight: 116.4 kg (256 lb 9.6 oz) IBW/kg (Calculated) : 54.7 Body mass index is 44.05 kg/m.  Recent Labs    11/14/23 1132  CREATININE 0.78  ALBUMIN 4.0  PROT 7.6  AST 18  ALT 15  ALKPHOS 71  BILITOT 0.6   Estimated Creatinine Clearance: 104.3 mL/min (by C-G formula based on SCr of 0.78 mg/dL).    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 Atypical intraepidermal melanocytic neoplasm with mild to moderate atypia. Excised 03/18/2010, margins free.   B12 deficiency    Chicken pox    Constipation    Crohn's disease (HCC)    Cystocele    Dysplastic nevus 03/26/2008   Left side/waistline. Slight to moderate atypia and halo nevus effect.   Dysplastic nevus 02/03/2010   Left mid side, minimal atypia.   Dysplastic nevus 03/25/2010   Right volar wrist. Mild atypia, edges free.   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   11/13/2023   fluticasone -salmeterol (ADVAIR) 250-50 MCG/ACT AEPB Inhale 1 puff into the lungs 2 (two) times daily as needed (shortness of breath). 14 each 5 11/14/2023 at  6:00 AM   levothyroxine  (SYNTHROID ) 150 MCG tablet Take 150 mcg by mouth daily before breakfast.   11/14/2023 at  6:00 AM   VENTOLIN  HFA 108 (90 Base) MCG/ACT inhaler INHALE 1-2 PUFFS BY MOUTH EVERY 6 HOURS AS NEEDED FOR WHEEZE OR SHORTNESS OF BREATH 18 each 1 Past  Week   minoxidil  (LONITEN ) 2.5 MG tablet Take 1/2 tablet once a day (Patient not taking: Reported on 10/24/2023) 30 tablet 2 Not Taking   pantoprazole  (PROTONIX ) 40 MG tablet Take 1 tablet (40 mg total) by mouth daily. (Patient not taking: Reported on 10/24/2023) 30 tablet 3 Not Taking    Lacinda Moats, PharmD Clinical Pharmacist  7/7/20253:28 PM

## 2023-11-14 NOTE — Transfer of Care (Signed)
 Immediate Anesthesia Transfer of Care Note  Patient: Linda Ware  Procedure(s) Performed: CREATION, GASTRIC BYPASS, LAPAROSCOPIC, USING ROUX-EN-Y GASTROENTEROSTOMY, WITH HIATAL HERNIA REPAIR ENDOSCOPY, UPPER GI TRACT REPAIR, HERNIA, HIATAL EXCISION, SMALL INTESTINE, LAPAROSCOPIC  Patient Location: PACU  Anesthesia Type:General  Level of Consciousness: awake and alert   Airway & Oxygen Therapy: Patient Spontanous Breathing and Patient connected to face mask oxygen  Post-op Assessment: Report given to RN and Post -op Vital signs reviewed and stable  Post vital signs: Reviewed and stable  Last Vitals:  Vitals Value Taken Time  BP 144/67 11/14/23 15:33  Temp    Pulse 82 11/14/23 15:36  Resp 18 11/14/23 15:36  SpO2 100 % 11/14/23 15:36  Vitals shown include unfiled device data.  Last Pain:  Vitals:   11/14/23 1129  TempSrc: Oral         Complications: No notable events documented.

## 2023-11-14 NOTE — H&P (Signed)
 Chief Complaint  Patient presents with  RETURN WEIGHT LOSS   Subjective   Linda Ware is a 53 y.o. female established patient in today for: History of Present Illness Linda Ware is a 53 year old female who presents for a gastrointestinal bypass and hiatal hernia repair.  She has completed the necessary preoperative preparations, including dietary restrictions and logistics for the day of surgery. The procedure will be performed laparoscopically, involving the creation of a small pouch at the top of the stomach and rerouting of the intestine. An endoscopy will be performed at the end of the surgery to ensure the connections are intact. Post-surgery, she will be on a liquid diet for two weeks and should avoid dehydration. She has multiple hernias that may need to be addressed during surgery if necessary.  Patient Active Problem List  Diagnosis  Abnormal uterine bleeding  Asthma, mild intermittent, well-controlled (HHS-HCC)  Cystocele or rectocele with incomplete uterine prolapse  GERD (gastroesophageal reflux disease)  History of dysplastic nevus  History of papillary adenocarcinoma of thyroid   Hypersomnia with sleep apnea  Hypothyroid  Obesity (BMI 30-39.9), unspecified  Plantar fasciitis of left foot  Rosacea  Stress incontinence  Venous insufficiency of leg  Rectal bleeding  Diarrhea  Dysphagia  Anemia, unspecified  At risk for diabetes mellitus  Crohn's disease of large bowel (CMS/HHS-HCC)  Depression  OSA (obstructive sleep apnea)  Prediabetes  Vitamin B12 deficiency  Vitamin D  deficiency   Outpatient Medications Prior to Visit  Medication Sig Dispense Refill  albuterol  90 mcg/actuation inhaler Inhale into the lungs.  famotidine  (PEPCID ) 20 MG tablet Take 20 mg by mouth at bedtime  fluticasone  propion-salmeteroL (ADVAIR DISKUS) 100-50 mcg/dose diskus inhaler Inhale 1 inhalation into the lungs every 12 (twelve) hours  levothyroxine  (SYNTHROID ) 150 MCG tablet Take 137  mcg by mouth once daily Take on an empty stomach with a glass of water  at least 30-60 minutes before breakfast.  mesalamine (LIALDA) 1.2 gram EC tablet 2 tablets  ascorbic acid, vitamin C, (VITAMIN C) 500 MG tablet Take 500 mg by mouth once daily (Patient not taking: Reported on 06/03/2021)  iron  18 mg Tab Take 1 tablet by mouth once daily (Patient not taking: Reported on 06/03/2021)  liothyronine (CYTOMEL) 5 MCG tablet 1 TABLET ORALLY ONCE A DAY AT LUNCH  magnesium gluconate (MAG-G) 27 mg (500 mg) tablet Take by mouth. (Patient not taking: Reported on 07/06/2023)  multivitamin with minerals tablet Take 1 tablet by mouth once daily (Patient not taking: Reported on 07/06/2023)  norethindrone-ethinyl estradiol  (JUNEL FE 1/20) 1 mg-20 mcg (21)/75 mg (7) tablet Take 1 tablet by mouth once daily (Patient not taking: Reported on 06/03/2021)  pyridoxine, vitamin B6, (B-6) 100 MG tablet Take 100 mg by mouth once daily (Patient not taking: Reported on 07/06/2023)  ranitidine (ZANTAC) 75 MG tablet Take 75 mg by mouth once daily (Patient not taking: Reported on 07/06/2023)  sucralfate (CARAFATE) 1 gram tablet TAKE 1 TABLET BY MOUTH TWICE A DAY ON EMPTY STOMACH FOR 30 DAYS  SYNTHROID  200 mcg tablet   No facility-administered medications prior to visit.    Objective   Vitals:  11/01/23 1538 11/01/23 1539  BP: 130/72  Pulse: 87  Temp: 36.7 C (98.1 F)  SpO2: 98%  Weight: (!) 120.7 kg (266 lb 3.2 oz)  Height: 162.6 cm (5' 4)  PainSc: 0-No pain   Body mass index is 45.69 kg/m. Physical Exam Constitutional:  Appearance: Normal appearance.  HENT:  Head: Normocephalic and atraumatic.  Pulmonary:  Effort: Pulmonary effort is normal.   Musculoskeletal:  General: Normal range of motion.  Cervical back: Normal range of motion.   Neurological:  General: No focal deficit present.  Mental Status: She is alert and oriented to person, place, and time. Mental status is at baseline.   Psychiatric:  Mood  and Affect: Mood normal.  Behavior: Behavior normal.  Thought Content: Thought content normal.     I reviewed UGI results showing small hiatal hernia  Assessment/Plan:   Assessment & Plan Hiatal hernia with gastroesophageal reflux Small hiatal hernia to be repaired during laparoscopic gastric bypass. Explained surgical risks: thromboembolism, anastomotic breakdown, bleeding, infection, hernia. Discussed postoperative care: fluid intake, mobility, complication monitoring. - Perform laparoscopic gastric bypass with hiatal hernia repair. - Administer perioperative anticoagulants. - Encourage postoperative mobility. - Monitor for dehydration, hemorrhage, infection, hernia. - Advise liquid diet for two weeks post-surgery. - Educate on dehydration risks and symptoms. - Discuss long-term risks: ulcer formation, internal hernia. - Advise against smoking and NSAID use. - Enroll in post-bariatric exercise program after initial postoperative visit. Diagnoses and all orders for this visit:  Morbid (severe) obesity due to excess calories (CMS/HHS-HCC)  Hiatal hernia with gastroesophageal reflux  The patient meets weight loss surgery criteria. Due to the above reasons, I think minimally invasive RNY gastric bypass is the best option for the patient.   We discussed RNY gastric bypass. We discussed the preoperative, operative and postoperative process. I explained the surgery in detail including the performance of an EGD near the end of the surgery to test for leak. We discussed the typical hospital course including a 1-2 day stay baring any complications. The patient was given Agricultural engineer. We did discuss the possibility of weight regain several years after the procedure.  The risks of infection, bleeding, pain, scarring, weight regain, too little or too much weight loss, vitamin deficiencies and need for lifelong vitamin supplementation, hair loss, need for protein supplementation, leaks,  stricture, reflux, food intolerance, gallstone formation, hernia, need for reoperation, need for open surgery, injury to spleen or surrounding structures, DVT's, PE, and death again discussed with the patient and the patient expressed understanding and desires to proceed with laparoscopic Roux en Y gastric bypass, possible open, intraoperative endoscopy.  We discussed that before and after surgery that there would be an alteration in their diet. I explained that we may put them on a diet 2 weeks before surgery. I also explained that they would be on a liquid diet for 2 weeks after surgery. We discussed that they would have to avoid certain foods after surgery. We discussed the importance of physical activity as well as compliance with our dietary and supplement recommendations and routine follow-up.

## 2023-11-14 NOTE — Progress Notes (Incomplete)
 PHARMACY CONSULT FOR:  Risk Assessment for Post-Discharge VTE Following Bariatric Surgery  Procedure* laparoscopic Roux en Y gastric bypass   Sex F  Black race N  Age (years) 53  BMI (kg/m2) 44  Operation duration (minutes) ***  History of VTE requiring treatment* N  Hypercoagulable condition* N  Liver disorder* N  Pre-op venous stasis Y  Pre-op functional health status Independent  Previous foregut or bariatric surg N  Post-op surgical site infection N  Transfusion intra- or post-op*   Unplanned readmission N  Unplanned reoperation N  GI perforation/leak/obstruction*   *specific risk factors for portomesenteric venous thrombosis   Predicted probability of 30-day post-discharge VTE:    *** % estimated using the [facility] Calculator  Other patient-specific factors to consider:   Recommendation for Discharge: No pharmacologic prophylaxis post-discharge Enoxaparin  40 mg [place] q12h x 2 weeks post-discharge Enoxaparin  40 mg [place] q12h x 4 weeks post-discharge Enoxaparin  40 mg [place] q12h x 30 days post-discharge Enoxaparin  60 mg [place] q12h x 2 weeks post-discharge Enoxaparin  60 mg  q12h x 4 weeks post-discharge Enoxaparin  60 mg  q12h x 30 days post-discharge    Linda Ware is a 53 y.o. female who underwent a laparoscopic Roux en Y gastric bypass on 11/14/23   Case start: 1328 Case end: ***   Allergies  Allergen Reactions   Codeine Nausea And Vomiting   Hydrocodone Nausea And Vomiting   Clindamycin Hives   Clindamycin Hcl Hives   Clindamycin/Lincomycin Rash    Patient Measurements: Height: 5' 4 (162.6 cm) Weight: 116.4 kg (256 lb 9.6 oz) IBW/kg (Calculated) : 54.7 Body mass index is 44.05 kg/m.  Recent Labs    11/14/23 1132  CREATININE 0.78  ALBUMIN 4.0  PROT 7.6  AST 18  ALT 15  ALKPHOS 71  BILITOT 0.6   Estimated Creatinine Clearance: 104.3 mL/min (by C-G formula based on SCr of 0.78 mg/dL).    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 Atypical intraepidermal melanocytic neoplasm with mild to moderate atypia. Excised 03/18/2010, margins free.   B12 deficiency    Chicken pox    Constipation    Crohn's disease (HCC)    Cystocele    Dysplastic nevus 03/26/2008   Left side/waistline. Slight to moderate atypia and halo nevus effect.   Dysplastic nevus 02/03/2010   Left mid side, minimal atypia.   Dysplastic nevus 03/25/2010   Right volar wrist. Mild atypia, edges free.   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   11/13/2023   fluticasone -salmeterol (ADVAIR) 250-50 MCG/ACT AEPB Inhale 1 puff into the lungs 2 (two) times daily as needed (shortness of breath). 14 each 5 11/14/2023 at  6:00 AM   levothyroxine  (SYNTHROID ) 150 MCG tablet Take 150 mcg by mouth daily before breakfast.   11/14/2023 at  6:00 AM   VENTOLIN  HFA 108 (90 Base) MCG/ACT inhaler INHALE 1-2 PUFFS BY MOUTH EVERY 6 HOURS AS NEEDED FOR WHEEZE  OR SHORTNESS OF BREATH 18 each 1 Past Week   minoxidil  (LONITEN ) 2.5 MG tablet Take 1/2 tablet once a day (Patient not taking: Reported on 10/24/2023) 30 tablet 2 Not Taking   pantoprazole  (PROTONIX ) 40 MG tablet Take 1 tablet (40 mg total) by mouth daily. (Patient not taking: Reported on 10/24/2023) 30 tablet 3 Not Taking    Lacinda Moats, PharmD Clinical Pharmacist  7/7/20252:10 PM

## 2023-11-14 NOTE — Anesthesia Procedure Notes (Signed)
 Procedure Name: Intubation Date/Time: 11/14/2023 1:08 PM  Performed by: Deeann Eva BROCKS, CRNAPre-anesthesia Checklist: Patient identified, Emergency Drugs available, Suction available and Patient being monitored Patient Re-evaluated:Patient Re-evaluated prior to induction Oxygen Delivery Method: Circle System Utilized Preoxygenation: Pre-oxygenation with 100% oxygen Induction Type: IV induction Ventilation: Mask ventilation without difficulty Laryngoscope Size: Mac and 3 Grade View: Grade II Tube type: Oral Tube size: 7.0 mm Number of attempts: 1 Airway Equipment and Method: Stylet and Oral airway Placement Confirmation: ETT inserted through vocal cords under direct vision, positive ETCO2 and breath sounds checked- equal and bilateral Secured at: 21 cm Tube secured with: Tape Dental Injury: Teeth and Oropharynx as per pre-operative assessment

## 2023-11-14 NOTE — Op Note (Addendum)
   Patient: Linda Ware (08-31-1970, [id number])  Date of Surgery: 11/14/2023  Preoperative Diagnosis: Morbid obesity   Postoperative Diagnosis: Morbid obesity   Surgical Procedure: Upper Endoscopy   Surgeon: Deward Foy, MD  Anesthesiologist: Epifanio Charleston, MD CRNA: Kathern Rollene LABOR, CRNA; Lyttle, Justin C, CRNA   Anesthesia: General   Fluids:  Total I/O In: 100 [IV Piggyback:100] Out: -   Complications: None  Drains:  None  Specimen: None   Indications for Procedure: Linda Ware is a 53 y.o. female undergoing gastric bypass and an EGD was requested to evaluate foregut anatomy intraoperatively.  Description of Procedure: During the procedure, I scrubbed out and obtained the Olympus endoscope. I gently placed endoscope in the patient's oropharynx and gently glided it down the esophagus without any difficulty under direct visualization.  The scope was advanced as far as the roux limb and then slowly withdrawn to inspect the foregut anatomy.  Dr. Stevie had placed saline in the upper abdomen and all staple lines were submerged to ensure no air leak. There was no evidence of bubbles. There was no evidence of intraluminal bleeding and the mucosa appeared healthy.  The lumen was widely patent without evidence of stricture.  The intraluminal insufflation was decompressed. The scope was withdrawn. The patient tolerated this portion of the procedure well. Please see Dr Roselynn operative note for details regarding the remainder of the procedure.    Deward Foy, MD General, Bariatric, & Minimally Invasive Surgery [facility], [place]

## 2023-11-15 ENCOUNTER — Encounter (HOSPITAL_COMMUNITY): Payer: Self-pay | Admitting: General Surgery

## 2023-11-15 LAB — CBC WITH DIFFERENTIAL/PLATELET
Abs Immature Granulocytes: 0.09 K/uL — ABNORMAL HIGH (ref 0.00–0.07)
Basophils Absolute: 0 K/uL (ref 0.0–0.1)
Basophils Relative: 0 %
Eosinophils Absolute: 0 K/uL (ref 0.0–0.5)
Eosinophils Relative: 0 %
HCT: 38.7 % (ref 36.0–46.0)
Hemoglobin: 11.7 g/dL — ABNORMAL LOW (ref 12.0–15.0)
Immature Granulocytes: 1 %
Lymphocytes Relative: 7 %
Lymphs Abs: 1 K/uL (ref 0.7–4.0)
MCH: 24.1 pg — ABNORMAL LOW (ref 26.0–34.0)
MCHC: 30.2 g/dL (ref 30.0–36.0)
MCV: 79.6 fL — ABNORMAL LOW (ref 80.0–100.0)
Monocytes Absolute: 0.5 K/uL (ref 0.1–1.0)
Monocytes Relative: 3 %
Neutro Abs: 13.7 K/uL — ABNORMAL HIGH (ref 1.7–7.7)
Neutrophils Relative %: 89 %
Platelets: 318 K/uL (ref 150–400)
RBC: 4.86 MIL/uL (ref 3.87–5.11)
RDW: 15.2 % (ref 11.5–15.5)
WBC: 15.3 K/uL — ABNORMAL HIGH (ref 4.0–10.5)
nRBC: 0 % (ref 0.0–0.2)

## 2023-11-15 MED ORDER — ACETAMINOPHEN 500 MG PO TABS: 1000.0000 mg | ORAL_TABLET | Freq: Three times a day (TID) | ORAL | Status: AC

## 2023-11-15 MED ORDER — PROCHLORPERAZINE EDISYLATE 10 MG/2ML IJ SOLN
10.0000 mg | Freq: Four times a day (QID) | INTRAMUSCULAR | Status: DC | PRN
  Administered 2023-11-15 – 2023-11-16 (×2): 10 mg via INTRAVENOUS
  Filled 2023-11-15 (×2): qty 2

## 2023-11-15 MED ORDER — GABAPENTIN 100 MG PO CAPS: 100.0000 mg | ORAL_CAPSULE | Freq: Two times a day (BID) | ORAL | 0 refills | Status: AC

## 2023-11-15 MED ORDER — SODIUM CHLORIDE 0.9 % IV SOLN
25.0000 mg | Freq: Three times a day (TID) | INTRAVENOUS | Status: DC | PRN
  Administered 2023-11-15: 25 mg via INTRAVENOUS
  Filled 2023-11-15: qty 1

## 2023-11-15 MED ORDER — CHLORPROMAZINE HCL 25 MG/ML IJ SOLN: 25.0000 mg | Freq: Three times a day (TID) | INTRAMUSCULAR | Status: DC | PRN

## 2023-11-15 MED ORDER — ONDANSETRON 4 MG PO TBDP: 4.0000 mg | ORAL_TABLET | Freq: Four times a day (QID) | ORAL | 0 refills | Status: AC | PRN

## 2023-11-15 NOTE — Progress Notes (Signed)
 PHARMACY CONSULT FOR:  Risk Assessment for Post-Discharge VTE Following Bariatric Surgery - ABBREVIATED NOTE/FOLLOW-UP  Procedure* laparoscopic Roux en Y gastric bypass   Sex F  Black race N  Age (years) 73  BMI (kg/m2) 44  Operation duration (minutes) 110  History of VTE requiring treatment* N  Hypercoagulable condition* N  Liver disorder* N  Pre-op venous stasis Y (not considered significant, see below)  Pre-op functional health status Independent  Previous foregut or bariatric surg N  Post-op surgical site infection N  Transfusion intra- or post-op* N  Unplanned readmission N  Unplanned reoperation N  GI perforation/leak/obstruction* N  *specific risk factors for portomesenteric venous thrombosis   11/14/23: Patient's predicted probability of 30-day post-discharge VTE is borderline depending on the severity of her leg venous insufficiency condition. Dr. Stevie informed that if her condition is deemed significant then she will need to be on Lovenox  40 mg SQ q12h x 2 weeks at discharge. Will defer decision to Dr. Stevie.   11/15/23: Per Dr. Stevie, he does not believe the patient has significant venous insufficiency. Patient's predicted probability of 30-day post-discharge VTE remains 0.25% (mild) as a result (estimated using the [facility] Calculator). No discharge VTE prophylaxis recommended at this time.    Thank you for allowing pharmacy to be part of this patient's care,   Lacinda Moats, PharmD Clinical Pharmacist  7/8/20258:01 AM

## 2023-11-15 NOTE — Progress Notes (Signed)
Local resources added to AVS

## 2023-11-15 NOTE — Progress Notes (Addendum)
   11/15/23 1315  TOC Brief Assessment  Insurance and Status Reviewed  Patient has primary care physician Yes  Home environment has been reviewed home with son  Prior level of function: independent  Prior/Current Home Services No current home services  Social Drivers of Health Review SDOH reviewed interventions complete  Readmission risk has been reviewed Yes  Transition of care needs no transition of care needs at this time   [employer] information for food resources placed on AVS.

## 2023-11-15 NOTE — Plan of Care (Signed)
  Problem: Clinical Measurements: Goal: Will remain free from infection 11/15/2023 0522 by Roann Field D, RN Outcome: Progressing 11/15/2023 0522 by Roann Field D, RN Outcome: Progressing Goal: Diagnostic test results will improve 11/15/2023 0522 by Roann Field D, RN Outcome: Progressing 11/15/2023 0522 by Roann Field BIRCH, RN Outcome: Progressing

## 2023-11-15 NOTE — Progress Notes (Signed)
.  [name] alert and oriented, pain is controlled. Patient was tolerating fluids better until being advanced to protein shake. Patient has been having nausea and vomiting. Surgeons has been reached out to with update of patient status, awaiting follow-up orders. Gastric sleeve/bypass post-op education instructions provided to patient and patient is able to articulate understanding. Provided information on BELT program, Support Group, BSTOP-D, and [facility] outpatient pharmacy. Communicated general update of patient status to surgeon. All questions answered. 24hr fluid recall is per hydration protocol, bariatric nurse coordinator to make follow-up phone call within one week.    Thank you,  Roseann Medley, RN, MSN Bariatric Nurse Coordinator [phone] (office)

## 2023-11-16 ENCOUNTER — Inpatient Hospital Stay (HOSPITAL_COMMUNITY): Admitting: Anesthesiology

## 2023-11-16 ENCOUNTER — Encounter (HOSPITAL_COMMUNITY)
Admission: RE | Disposition: A | Discharge status: Home / Self Care | Payer: Self-pay | Source: Ambulatory Visit | Attending: General Surgery

## 2023-11-16 ENCOUNTER — Encounter (HOSPITAL_COMMUNITY): Payer: Self-pay | Admitting: General Surgery

## 2023-11-16 ENCOUNTER — Inpatient Hospital Stay (HOSPITAL_COMMUNITY)

## 2023-11-16 DIAGNOSIS — E039 Hypothyroidism, unspecified: Secondary | ICD-10-CM | POA: Diagnosis not present

## 2023-11-16 DIAGNOSIS — K436 Other and unspecified ventral hernia with obstruction, without gangrene: Secondary | ICD-10-CM

## 2023-11-16 DIAGNOSIS — Z6841 Body Mass Index (BMI) 40.0 and over, adult: Secondary | ICD-10-CM

## 2023-11-16 HISTORY — PX: VENTRAL HERNIA REPAIR: SHX424

## 2023-11-16 LAB — CBC WITH DIFFERENTIAL/PLATELET
Abs Immature Granulocytes: 0.06 K/uL (ref 0.00–0.07)
Basophils Absolute: 0.1 K/uL (ref 0.0–0.1)
Basophils Relative: 0 %
Eosinophils Absolute: 0 K/uL (ref 0.0–0.5)
Eosinophils Relative: 0 %
HCT: 38.1 % (ref 36.0–46.0)
Hemoglobin: 11.8 g/dL — ABNORMAL LOW (ref 12.0–15.0)
Immature Granulocytes: 0 %
Lymphocytes Relative: 10 %
Lymphs Abs: 1.6 K/uL (ref 0.7–4.0)
MCH: 24.1 pg — ABNORMAL LOW (ref 26.0–34.0)
MCHC: 31 g/dL (ref 30.0–36.0)
MCV: 77.9 fL — ABNORMAL LOW (ref 80.0–100.0)
Monocytes Absolute: 0.9 K/uL (ref 0.1–1.0)
Monocytes Relative: 6 %
Neutro Abs: 13.2 K/uL — ABNORMAL HIGH (ref 1.7–7.7)
Neutrophils Relative %: 84 %
Platelets: 318 K/uL (ref 150–400)
RBC: 4.89 MIL/uL (ref 3.87–5.11)
RDW: 15.8 % — ABNORMAL HIGH (ref 11.5–15.5)
WBC: 15.8 K/uL — ABNORMAL HIGH (ref 4.0–10.5)
nRBC: 0 % (ref 0.0–0.2)

## 2023-11-16 LAB — SURGICAL PATHOLOGY

## 2023-11-16 SURGERY — REPAIR, HERNIA, VENTRAL, LAPAROSCOPIC
Anesthesia: General

## 2023-11-16 MED ORDER — ACETAMINOPHEN 10 MG/ML IV SOLN
1000.0000 mg | Freq: Once | INTRAVENOUS | Status: DC | PRN
Start: 2023-11-16 — End: 2023-11-16

## 2023-11-16 MED ORDER — 0.9 % SODIUM CHLORIDE (POUR BTL) OPTIME
TOPICAL | Status: DC | PRN
  Administered 2023-11-16: 1000 mL

## 2023-11-16 MED ORDER — SUCCINYLCHOLINE CHLORIDE 200 MG/10ML IV SOSY
PREFILLED_SYRINGE | INTRAVENOUS | Status: DC | PRN
  Administered 2023-11-16: 100 mg via INTRAVENOUS

## 2023-11-16 MED ORDER — FENTANYL CITRATE (PF) 250 MCG/5ML IJ SOLN
INTRAMUSCULAR | Status: DC | PRN
  Administered 2023-11-16: 50 ug via INTRAVENOUS
  Administered 2023-11-16: 100 ug via INTRAVENOUS

## 2023-11-16 MED ORDER — LACTATED RINGERS IV SOLN: INTRAVENOUS | Status: DC | PRN

## 2023-11-16 MED ORDER — PHENYLEPHRINE 80 MCG/ML (10ML) SYRINGE FOR IV PUSH (FOR BLOOD PRESSURE SUPPORT)
PREFILLED_SYRINGE | INTRAVENOUS | Status: DC | PRN
  Administered 2023-11-16: 80 ug via INTRAVENOUS

## 2023-11-16 MED ORDER — ROCURONIUM BROMIDE 10 MG/ML (PF) SYRINGE
PREFILLED_SYRINGE | INTRAVENOUS | Status: AC
  Filled 2023-11-16: qty 10

## 2023-11-16 MED ORDER — FENTANYL CITRATE (PF) 250 MCG/5ML IJ SOLN
INTRAMUSCULAR | Status: AC
  Filled 2023-11-16: qty 5

## 2023-11-16 MED ORDER — LACTATED RINGERS IV SOLN: INTRAVENOUS | Status: DC

## 2023-11-16 MED ORDER — CEFAZOLIN SODIUM-DEXTROSE 2-4 GM/100ML-% IV SOLN
2.0000 g | INTRAVENOUS | Status: AC
  Administered 2023-11-16: 2 g via INTRAVENOUS
  Filled 2023-11-16: qty 100

## 2023-11-16 MED ORDER — DEXAMETHASONE SODIUM PHOSPHATE 10 MG/ML IJ SOLN
INTRAMUSCULAR | Status: DC | PRN
  Administered 2023-11-16: 8 mg via INTRAVENOUS

## 2023-11-16 MED ORDER — BUPIVACAINE-EPINEPHRINE (PF) 0.25% -1:200000 IJ SOLN
INTRAMUSCULAR | Status: AC
  Filled 2023-11-16: qty 30

## 2023-11-16 MED ORDER — SODIUM CHLORIDE (PF) 0.9 % IJ SOLN
INTRAMUSCULAR | Status: AC
  Filled 2023-11-16: qty 50

## 2023-11-16 MED ORDER — DEXTROSE-SODIUM CHLORIDE 5-0.45 % IV SOLN: INTRAVENOUS | Status: DC

## 2023-11-16 MED ORDER — HYDROMORPHONE HCL 1 MG/ML IJ SOLN: 0.2500 mg | INTRAMUSCULAR | Status: DC | PRN

## 2023-11-16 MED ORDER — BUPIVACAINE-EPINEPHRINE 0.25% -1:200000 IJ SOLN
INTRAMUSCULAR | Status: DC | PRN
  Administered 2023-11-16: 30 mL

## 2023-11-16 MED ORDER — PROPOFOL 10 MG/ML IV BOLUS
INTRAVENOUS | Status: DC | PRN
  Administered 2023-11-16: 200 mg via INTRAVENOUS

## 2023-11-16 MED ORDER — ROCURONIUM BROMIDE 100 MG/10ML IV SOLN
INTRAVENOUS | Status: DC | PRN
  Administered 2023-11-16: 40 mg via INTRAVENOUS

## 2023-11-16 MED ORDER — DROPERIDOL 2.5 MG/ML IJ SOLN: 0.6250 mg | Freq: Once | INTRAMUSCULAR | Status: DC | PRN

## 2023-11-16 MED ORDER — IOHEXOL 9 MG/ML PO SOLN
ORAL | Status: AC
  Filled 2023-11-16: qty 500

## 2023-11-16 MED ORDER — ONDANSETRON HCL 4 MG/2ML IJ SOLN
INTRAMUSCULAR | Status: DC | PRN
  Administered 2023-11-16: 4 mg via INTRAVENOUS

## 2023-11-16 MED ORDER — IOHEXOL 9 MG/ML PO SOLN: 500.0000 mL | ORAL | Status: DC

## 2023-11-16 MED ORDER — CHLORHEXIDINE GLUCONATE 0.12 % MT SOLN
15.0000 mL | Freq: Once | OROMUCOSAL | Status: AC
  Administered 2023-11-16: 15 mL via OROMUCOSAL

## 2023-11-16 MED ORDER — LIDOCAINE HCL (CARDIAC) PF 100 MG/5ML IV SOSY
PREFILLED_SYRINGE | INTRAVENOUS | Status: DC | PRN
  Administered 2023-11-16: 60 mg via INTRATRACHEAL

## 2023-11-16 MED ORDER — IOHEXOL 300 MG/ML  SOLN
100.0000 mL | Freq: Once | INTRAMUSCULAR | Status: AC | PRN
  Administered 2023-11-16: 100 mL via INTRAVENOUS

## 2023-11-16 MED ORDER — SUGAMMADEX SODIUM 200 MG/2ML IV SOLN
INTRAVENOUS | Status: DC | PRN
  Administered 2023-11-16: 400 mg via INTRAVENOUS

## 2023-11-16 MED ORDER — SUCCINYLCHOLINE CHLORIDE 200 MG/10ML IV SOSY
PREFILLED_SYRINGE | INTRAVENOUS | Status: AC
  Filled 2023-11-16: qty 10

## 2023-11-16 MED ORDER — PROPOFOL 10 MG/ML IV BOLUS
INTRAVENOUS | Status: AC
  Filled 2023-11-16: qty 20

## 2023-11-16 MED ORDER — MIDAZOLAM HCL 2 MG/2ML IJ SOLN
INTRAMUSCULAR | Status: AC
  Filled 2023-11-16: qty 2

## 2023-11-16 MED ORDER — IOHEXOL 9 MG/ML PO SOLN
100.0000 mL | ORAL | Status: AC
  Administered 2023-11-16: 100 mL via ORAL

## 2023-11-16 MED ORDER — MIDAZOLAM HCL 2 MG/2ML IJ SOLN
INTRAMUSCULAR | Status: DC | PRN
  Administered 2023-11-16: 2 mg via INTRAVENOUS

## 2023-11-16 MED ORDER — STERILE WATER FOR IRRIGATION IR SOLN
Status: DC | PRN
  Administered 2023-11-16: 1000 mL

## 2023-11-16 SURGICAL SUPPLY — 39 items
BAG COUNTER SPONGE SURGICOUNT (BAG) IMPLANT
BENZOIN TINCTURE PRP APPL 2/3 (GAUZE/BANDAGES/DRESSINGS) ×1 IMPLANT
BINDER ABDOMINAL 12 ML 46-62 (SOFTGOODS) IMPLANT
BNDG ADH 1X3 SHEER STRL LF (GAUZE/BANDAGES/DRESSINGS) ×5 IMPLANT
CABLE HIGH FREQUENCY MONO STRZ (ELECTRODE) ×1 IMPLANT
CHLORAPREP W/TINT 26 (MISCELLANEOUS) ×1 IMPLANT
CLIP APPLIE 5 13 M/L LIGAMAX5 (MISCELLANEOUS) IMPLANT
COVER SURGICAL LIGHT HANDLE (MISCELLANEOUS) ×1 IMPLANT
DEVICE SECURE STRAP 25 ABSORB (INSTRUMENTS) IMPLANT
DRAIN CHANNEL 19F RND (DRAIN) IMPLANT
DRAPE WARM FLUID 44X44 (DRAPES) ×1 IMPLANT
ELECT REM PT RETURN 15FT ADLT (MISCELLANEOUS) ×1 IMPLANT
EVACUATOR SILICONE 100CC (DRAIN) IMPLANT
GLOVE BIOGEL PI IND STRL 7.0 (GLOVE) ×1 IMPLANT
GLOVE SURG SS PI 7.0 STRL IVOR (GLOVE) ×1 IMPLANT
GOWN STRL REUS W/ TWL LRG LVL3 (GOWN DISPOSABLE) ×1 IMPLANT
GOWN STRL REUS W/ TWL XL LVL3 (GOWN DISPOSABLE) IMPLANT
GRASPER SUT TROCAR 14GX15 (MISCELLANEOUS) ×1 IMPLANT
IRRIGATION SUCT STRKRFLW 2 WTP (MISCELLANEOUS) IMPLANT
KIT BASIN OR (CUSTOM PROCEDURE TRAY) ×1 IMPLANT
KIT TURNOVER KIT A (KITS) ×1 IMPLANT
PACK CARDIOVASCULAR III (CUSTOM PROCEDURE TRAY) ×1 IMPLANT
PENCIL SMOKE EVACUATOR (MISCELLANEOUS) IMPLANT
SCISSORS LAP 5X35 DISP (ENDOMECHANICALS) ×1 IMPLANT
SET TUBE SMOKE EVAC HIGH FLOW (TUBING) ×1 IMPLANT
SHEARS HARMONIC 36 ACE (MISCELLANEOUS) IMPLANT
SLEEVE Z-THREAD 5X100MM (TROCAR) ×1 IMPLANT
SPIKE FLUID TRANSFER (MISCELLANEOUS) ×1 IMPLANT
STRIP CLOSURE SKIN 1/2X4 (GAUZE/BANDAGES/DRESSINGS) ×1 IMPLANT
SUT ETHILON 2 0 PS N (SUTURE) IMPLANT
SUT MNCRL AB 4-0 PS2 18 (SUTURE) ×1 IMPLANT
SUT NOVA NAB GS-21 0 18 T12 DT (SUTURE) ×1 IMPLANT
SUT PDS AB 0 CT1 36 (SUTURE) IMPLANT
SUT VICRYL 0 TIES 12 18 (SUTURE) IMPLANT
SUT VICRYL 0 UR6 27IN ABS (SUTURE) IMPLANT
TOWEL OR 17X26 10 PK STRL BLUE (TOWEL DISPOSABLE) ×1 IMPLANT
TRAY LAPAROSCOPIC (CUSTOM PROCEDURE TRAY) ×1 IMPLANT
TROCAR ADV FIXATION 12X100MM (TROCAR) ×1 IMPLANT
TROCAR Z-THREAD OPTICAL 5X100M (TROCAR) ×1 IMPLANT

## 2023-11-16 NOTE — Plan of Care (Signed)
   Problem: Nutrition: Goal: Adequate nutrition will be maintained Outcome: Progressing

## 2023-11-16 NOTE — Progress Notes (Signed)
 Patient alert and oriented, Post op day 2.  Provided support and encouragement.  Encouraged small sips of liquids.  Patient still experiencing nausea, but it has been helped better with the compazine . Patient drinking contrast in preparation for CT scan. No further questions at this time. Will continue to monitor.    Thank you,  Roseann Medley, RN, MSN Bariatric Nurse Coordinator [phone] (office)

## 2023-11-16 NOTE — Anesthesia Procedure Notes (Signed)
 Procedure Name: Intubation Date/Time: 11/16/2023 4:13 PM  Performed by: Obadiah Reyes BROCKS, CRNAPre-anesthesia Checklist: Patient identified, Emergency Drugs available, Suction available and Patient being monitored Patient Re-evaluated:Patient Re-evaluated prior to induction Oxygen Delivery Method: Circle System Utilized Preoxygenation: Pre-oxygenation with 100% oxygen Induction Type: IV induction and Rapid sequence Ventilation: Mask ventilation without difficulty Laryngoscope Size: [place] and 2 Grade View: Grade I Tube type: Oral Tube size: 7.0 mm Number of attempts: 1 Airway Equipment and Method: Stylet and Oral airway Placement Confirmation: ETT inserted through vocal cords under direct vision, positive ETCO2 and breath sounds checked- equal and bilateral Secured at: 22 cm Tube secured with: Tape Dental Injury: Teeth and Oropharynx as per pre-operative assessment

## 2023-11-16 NOTE — Progress Notes (Signed)
 Progress Note: Metabolic and Bariatric Surgery Service   Chief Complaint/Subjective: Some nausea this morning, tolerated diet well  Objective: Vital signs in last 24 hours: Temp:  [97.6 F (36.4 C)-98.3 F (36.8 C)] 97.6 F (36.4 C) (07/09 0635) Pulse Rate:  [47-89] 75 (07/09 0635) Resp:  [14-18] 18 (07/09 0635) BP: (138-155)/(57-83) 142/71 (07/09 0635) SpO2:  [93 %-100 %] 97 % (07/09 0857) Last BM Date : 11/14/23  Intake/Output from previous day: 07/08 0701 - 07/09 0700 In: 1742.7 [P.O.:210; I.V.:1407.7; IV Piggyback:125] Out: 1200 [Urine:1200] Intake/Output this shift: No intake/output data recorded.  Lungs: nonlabored  Cardiovascular: RRR  Abd: soft, small ventral hernia, easily reducible  Extremities: no edema  Neuro: AOx4   PT/INR No results for input(s): LABPROT, INR in the last 72 hours. ABG No results for input(s): PHART, HCO3 in the last 72 hours.  Invalid input(s): PCO2, PO2  Studies/Results:  Anti-infectives: Anti-infectives (From admission, onward)    Start     Dose/Rate Route Frequency Ordered Stop   11/14/23 1145  cefoTEtan  (CEFOTAN ) 2 g in sodium chloride  0.9 % 100 mL IVPB        2 g 200 mL/hr over 30 Minutes Intravenous On call to O.R. 11/14/23 1131 11/14/23 1258       Medications: Scheduled Meds:  acetaminophen   1,000 mg Oral Q8H   Or   acetaminophen  (TYLENOL ) oral liquid 160 mg/5 mL  1,000 mg Oral Q8H   fluticasone  furoate-vilanterol  1 puff Inhalation Daily   gabapentin   200 mg Oral Q12H   heparin  injection (subcutaneous)  5,000 Units Subcutaneous Q8H   levothyroxine   150 mcg Oral Q0600   Ensure Max Protein  2 oz Oral Q2H   Continuous Infusions:  chlorproMAZINE  (THORAZINE ) 25 mg in sodium chloride  0.9 % 25 mL IVPB 25 mg (11/15/23 1510)   famotidine  (PEPCID ) IV 20 mg (11/15/23 2108)   PRN Meds:.albuterol , chlorproMAZINE  (THORAZINE ) 25 mg in sodium chloride  0.9 % 25 mL IVPB, hydrALAZINE , HYDROmorphone  (DILAUDID )  injection, ondansetron  (ZOFRAN ) IV, prochlorperazine , simethicone , traMADol   Assessment/Plan: Patient Active Problem List   Diagnosis Date Noted   Morbid (severe) obesity due to excess calories (HCC) 11/14/2023   Morbid obesity (HCC) 06/23/2023   Crohn's disease of large bowel (HCC) 06/03/2021   Crohn's disease with complication (HCC) 04/29/2020   At risk for complication associated with hypotension 04/10/2020   Depression 03/20/2020   Prediabetes 03/06/2020   At risk for diabetes mellitus 03/06/2020   Mood disorder (HCC) 02/21/2020   Vitamin B12 deficiency 02/21/2020   Vitamin D  deficiency 02/21/2020   Anemia 02/21/2020   OSA (obstructive sleep apnea) 02/21/2020   Stress incontinence 08/20/2015   Vaginal pessary in situ 08/20/2015   Cystocele with small rectocele and uterine descent 08/20/2015   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/2014   History of papillary adenocarcinoma of thyroid  01/24/2013   Allergic urticaria 12/25/2012   GERD (gastroesophageal reflux disease) 08/10/2012   Rosacea 05/22/2012   History of dysplastic nevus 05/17/2012   Venous insufficiency of leg 04/24/2012   Plantar fasciitis of left foot 12/14/2011   Rotator cuff syndrome of right shoulder 12/14/2011   Hypothyroid 10/21/2011   Asthma, mild intermittent, well-controlled 10/21/2011   s/p Procedure(s): CREATION, GASTRIC BYPASS, LAPAROSCOPIC, USING ROUX-EN-Y GASTROENTEROSTOMY, WITH HIATAL HERNIA REPAIR ENDOSCOPY, UPPER GI TRACT REPAIR, HERNIA, HIATAL EXCISION, SMALL INTESTINE, LAPAROSCOPIC 11/14/2023 -continue post op care Compazine  and thorazine  for nausea and hiccups  Disposition:  LOS: 2 days  The patient will be in the hospital for normal postop protocol  [facility]  Beverley Bureau, MD [phone] [facility]

## 2023-11-16 NOTE — Op Note (Signed)
 Preoperative diagnosis: obstructing incisional hernia  Postoperative diagnosis: same   Procedure: laparoscopic reduction of hernia, laparoscopic primary repair of hernia  Surgeon: Herlene Bureau, M.D.  Asst: none  Anesthesia: GETA  Indications for procedure: Linda Ware is a 53 y.o. year old female with symptoms of abdominal pain and distension 2 days after gastric bypass. Findings were concerning for obstructing hernia and Ct confirmed findings. She was taken back for repair.  Description of procedure: The patient was brought into the operative suite. Anesthesia was administered with General endotracheal anesthesia. WHO checklist was applied. The patient was then placed in supine position. The area was prepped and draped in the usual sterile fashion.  The previous left subcostal incision was reopened. A 5mm trocar was used to gain access to the peritoneal cavity by optical entry technique. Pneumoperitoneum was applied with a high flow and low pressure. The laparoscope was reinserted to confirm position.  On initial examination of the abdomen there were multiple dilated loops of small intestine.  There was 1 loop of intestine going up to the epigastric hernia which is easily reduced with external compression.  There are no signs of ischemia within the loop of intestine.  The GJ was inspected and appeared to be intact.  Due to the level of intestinal distention I did not try and do a formal round of the entirety of the bowel as CT scan had confirmed transition within the hernia.  The hernia was then closed with multiple 0 PDS sutures via laparoscopic suture passer.  Marcaine  was injected into the muscle layer around the repair.  All trocars removed.  All incisions were closed with 4-0 Monocryl.  Patient woke from anesthesia brought to PACU in stable condition.  All counts are correct.  Findings: small intestine of common channel within incisional hernia  Specimen: none  Implant: none    Blood loss: 10 ml  Local anesthesia: 30 ml marcaine    Complications: none  Herlene Bureau, M.D. General, Bariatric, & Minimally Invasive Surgery [facility]

## 2023-11-16 NOTE — Progress Notes (Signed)
  2 Days Post-Op   Chief Complaint/Subjective: Tolerating some water , slowly, still nauseated  Objective: Vital signs in last 24 hours: Temp:  [97.6 F (36.4 C)-98.3 F (36.8 C)] 97.6 F (36.4 C) (07/09 0635) Pulse Rate:  [47-89] 75 (07/09 0635) Resp:  [14-18] 18 (07/09 0635) BP: (138-155)/(57-83) 142/71 (07/09 0635) SpO2:  [93 %-100 %] 94 % (07/09 0635) Last BM Date : 11/14/23 Intake/Output from previous day: 07/08 0701 - 07/09 0700 In: 1742.7 [P.O.:210; I.V.:1407.7; IV Piggyback:125] Out: 1200 [Urine:1200]  PE: Gen: NAd Resp: nonlabored Card: RRR Abd: soft, firmness in mid abdomen  Lab Results:  Recent Labs    11/15/23 0438 11/16/23 0450  WBC 15.3* 15.8*  HGB 11.7* 11.8*  HCT 38.7 38.1  PLT 318 318   Recent Labs    11/14/23 1132  NA 139  K 3.9  CL 107  CO2 22  GLUCOSE 92  BUN 15  CREATININE 0.78  CALCIUM 9.2   No results for input(s): LABPROT, INR in the last 72 hours.    Component Value Date/Time   NA 139 11/14/2023 1132   NA 138 04/11/2020 0000   K 3.9 11/14/2023 1132   CL 107 11/14/2023 1132   CO2 22 11/14/2023 1132   GLUCOSE 92 11/14/2023 1132   BUN 15 11/14/2023 1132   BUN 11 04/11/2020 0000   CREATININE 0.78 11/14/2023 1132   CREATININE 0.82 02/02/2023 0809   CALCIUM 9.2 11/14/2023 1132   PROT 7.6 11/14/2023 1132   PROT 6.8 02/21/2020 1005   ALBUMIN 4.0 11/14/2023 1132   ALBUMIN 4.1 02/21/2020 1005   AST 18 11/14/2023 1132   ALT 15 11/14/2023 1132   ALKPHOS 71 11/14/2023 1132   BILITOT 0.6 11/14/2023 1132   BILITOT 0.5 02/21/2020 1005   GFRNONAA >60 11/14/2023 1132   GFRAA >60 04/11/2020 0000    Assessment/Plan  s/p Procedure(s): CREATION, GASTRIC BYPASS, LAPAROSCOPIC, USING ROUX-EN-Y GASTROENTEROSTOMY, WITH HIATAL HERNIA REPAIR ENDOSCOPY, UPPER GI TRACT REPAIR, HERNIA, HIATAL EXCISION, SMALL INTESTINE, LAPAROSCOPIC 11/14/2023   FEN - NPO VTE - lovenox  ID - periop abx Disposition - CT scan today to evaluate anatomy,  concern for hernia causing problems   LOS: 2 days   I reviewed last 24 h vitals and pain scores, last 48 h intake and output, last 24 h labs and trends, and last 24 h imaging results.  This care required moderate level of medical decision making.   Herlene Righter [facility] at [facility] 11/16/2023, 8:04 AM Please see Amion for pager number during day hours 7:00am-4:30pm or 7:00am -11:30am on weekends

## 2023-11-16 NOTE — Anesthesia Preprocedure Evaluation (Addendum)
 Anesthesia Evaluation  Patient identified by MRN, date of birth, ID band Patient awake    Reviewed: Allergy & Precautions, NPO status , Patient's Chart, lab work & pertinent test results  History of Anesthesia Complications Negative for: history of anesthetic complications  Airway Mallampati: III  TM Distance: >3 FB Neck ROM: Full    Dental no notable dental hx.    Pulmonary sleep apnea , former smoker   Pulmonary exam normal        Cardiovascular negative cardio ROS Normal cardiovascular exam     Neuro/Psych    Depression       GI/Hepatic Neg liver ROS, PUD,GERD  Medicated,,Obstructing ventral hernia s/p LAPAROSCOPIC ROUX-EN-Y GASTRIC BYPASS WITH HIATAL HERNIA REPAIR 11/14/23   Endo/Other  Hypothyroidism  Class 3 obesity  Renal/GU negative Renal ROS     Musculoskeletal negative musculoskeletal ROS (+)    Abdominal   Peds  Hematology  (+) Blood dyscrasia (Hgb 11.8), anemia   Anesthesia Other Findings Day of surgery medications reviewed with patient.  Reproductive/Obstetrics                              Anesthesia Physical Anesthesia Plan  ASA: 3 and emergent  Anesthesia Plan: General   Post-op Pain Management: Ofirmev  IV (intra-op)*   Induction: Intravenous and Rapid sequence  PONV Risk Score and Plan: 3 and Treatment may vary due to age or medical condition, Ondansetron , Dexamethasone  and Midazolam   Airway Management Planned: Oral ETT  Additional Equipment: None  Intra-op Plan:   Post-operative Plan: Extubation in OR  Informed Consent: I have reviewed the patients History and Physical, chart, labs and discussed the procedure including the risks, benefits and alternatives for the proposed anesthesia with the patient or authorized representative who has indicated his/her understanding and acceptance.     Dental advisory given  Plan Discussed with: CRNA  Anesthesia Plan  Comments:          Anesthesia Quick Evaluation

## 2023-11-16 NOTE — Transfer of Care (Signed)
 Immediate Anesthesia Transfer of Care Note  Patient: Linda Ware  Procedure(s) Performed: REPAIR, HERNIA, VENTRAL, LAPAROSCOPIC  Patient Location: PACU  Anesthesia Type:General  Level of Consciousness: awake, alert , and oriented  Airway & Oxygen Therapy: Patient Spontanous Breathing  Post-op Assessment: Report given to RN and Post -op Vital signs reviewed and stable  Post vital signs: Reviewed and stable  Last Vitals:  Vitals Value Taken Time  BP 127/64 11/16/23 17:07  Temp    Pulse 81 11/16/23 17:12  Resp 24 11/16/23 17:12  SpO2 94 % 11/16/23 17:12  Vitals shown include unfiled device data.  Last Pain:  Vitals:   11/16/23 1506  TempSrc: Oral  PainSc:          Complications: No notable events documented.

## 2023-11-16 NOTE — Progress Notes (Signed)
 Linda Ware continued to have nausea and vomiting early today. She underwent CT scan showing a loop of small intestine in the ventral hernia. I think her symptoms are likely related to the hernia and needs to go back to the OR for ventral hernia repair. I discussed the case with her including risks and benefits. She showed good understanding and wanted to proceed.

## 2023-11-17 ENCOUNTER — Encounter (HOSPITAL_COMMUNITY): Payer: Self-pay | Admitting: General Surgery

## 2023-11-17 MED ORDER — ENOXAPARIN SODIUM 60 MG/0.6ML IJ SOSY
60.0000 mg | PREFILLED_SYRINGE | INTRAMUSCULAR | Status: DC
  Administered 2023-11-17: 60 mg via SUBCUTANEOUS
  Filled 2023-11-17: qty 0.6

## 2023-11-17 MED ORDER — PHENYLEPHRINE HCL-NACL 20-0.9 MG/250ML-% IV SOLN
INTRAVENOUS | Status: AC
  Filled 2023-11-17: qty 500

## 2023-11-17 MED ORDER — ENOXAPARIN SODIUM 60 MG/0.6ML IJ SOSY: 60.0000 mg | PREFILLED_SYRINGE | INTRAMUSCULAR | 0 refills | Status: AC

## 2023-11-17 MED ORDER — PROPOFOL 10 MG/ML IV BOLUS
INTRAVENOUS | Status: AC
Start: 2023-11-17 — End: 2023-11-17
  Filled 2023-11-17: qty 20

## 2023-11-17 MED ORDER — ENOXAPARIN (LOVENOX) PATIENT EDUCATION KIT
PACK | Freq: Once | Status: DC
  Filled 2023-11-17: qty 1

## 2023-11-17 NOTE — Progress Notes (Signed)
 Discharge instructions discussed with patient, verbalized agreement and understanding

## 2023-11-17 NOTE — Plan of Care (Signed)
   Problem: Activity: Goal: Risk for activity intolerance will decrease Outcome: Progressing   Problem: Nutrition: Goal: Adequate nutrition will be maintained Outcome: Progressing

## 2023-11-17 NOTE — Progress Notes (Signed)
 PHARMACY CONSULT FOR:  Risk Assessment for Post-Discharge VTE Following Bariatric Surgery  Procedure* laparoscopic Roux en Y gastric bypass   Sex F  Black race N  Age (years) 3  BMI (kg/m2) 44  Operation duration (minutes) 110  History of VTE requiring treatment* N  Hypercoagulable condition* N  Liver disorder* N  Pre-op venous stasis N**  Pre-op functional health status Independent  Previous foregut or bariatric surg N  Post-op surgical site infection N  Transfusion intra- or post-op* N  Unplanned readmission N  Unplanned reoperation Y (laparoscopic hernia reduction on 7/9)  GI perforation/leak/obstruction* N  *specific risk factors for portomesenteric venous thrombosis   Predicted probability of 30-day post-discharge VTE: 1.07% [SEVERE]  **Other patient-specific factors to consider: Despite a history mentioned in chart, Dr. Stevie, does not believe the patient has clinically significant venous insufficiency. Will not include as risk factor.   Recommendation for Discharge: Enoxaparin  40 mg [place] q12h x 4 weeks post-discharge   Allergies  Allergen Reactions   Codeine Nausea And Vomiting   Hydrocodone Nausea And Vomiting   Clindamycin Hives   Clindamycin Hcl Hives   Clindamycin/Lincomycin Rash    Patient Measurements: Height: 5' 4 (162.6 cm) Weight: 116 kg (255 lb 11.7 oz) IBW/kg (Calculated) : 54.7 Body mass index is 43.9 kg/m.  Recent Labs    11/14/23 1132 11/14/23 1930 11/15/23 0438 11/16/23 0450  WBC  --  17.7* 15.3* 15.8*  HGB  --  11.8* 11.7* 11.8*  HCT  --  38.8 38.7 38.1  PLT  --  295 318 318  CREATININE 0.78  --   --   --   ALBUMIN 4.0  --   --   --   PROT 7.6  --   --   --   AST 18  --   --   --   ALT 15  --   --   --   ALKPHOS 71  --   --   --   BILITOT 0.6  --   --   --    Estimated Creatinine Clearance: 104 mL/min (by C-G formula based on SCr of 0.78 mg/dL).  Medications Prior to Admission  Medication Sig Dispense Refill Last  Dose/Taking   famotidine  (PEPCID ) 40 MG tablet Take 40 mg by mouth at bedtime.   11/13/2023   fluticasone -salmeterol (ADVAIR) 250-50 MCG/ACT AEPB Inhale 1 puff into the lungs 2 (two) times daily as needed (shortness of breath). 14 each 5 11/14/2023 at  6:00 AM   levothyroxine  (SYNTHROID ) 150 MCG tablet Take 150 mcg by mouth daily before breakfast.   11/14/2023 at  6:00 AM   VENTOLIN  HFA 108 (90 Base) MCG/ACT inhaler INHALE 1-2 PUFFS BY MOUTH EVERY 6 HOURS AS NEEDED FOR WHEEZE OR SHORTNESS OF BREATH 18 each 1 Past Week   minoxidil  (LONITEN ) 2.5 MG tablet Take 1/2 tablet once a day (Patient not taking: Reported on 10/24/2023) 30 tablet 2 Not Taking   pantoprazole  (PROTONIX ) 40 MG tablet Take 1 tablet (40 mg total) by mouth daily. (Patient not taking: Reported on 10/24/2023) 30 tablet 3 Not Taking   Thank you for allowing pharmacy to be part of this patient's care,   Bard Jeans, PharmD, BCPS [phone] 11/17/2023, 11:12 AM

## 2023-11-17 NOTE — Discharge Summary (Signed)
 Physician Discharge Summary  Linda Ware DOB: 1971-04-16 DOA: 11/14/2023  PCP: Ziglar, Susan K, MD  Admit date: 11/14/2023 Discharge date:  11/17/2023   Recommendations for Outpatient Follow-up:   (include homehealth, outpatient follow-up instructions, specific recommendations for PCP to follow-up on, etc.)   Follow-up Information     Ambur Province, Herlene Righter, MD Follow up on 12/08/2023.   Specialty: General Surgery Why: Please arrive 15 minutes prior to your appointment at 3:30pm Contact information: [address] [place] [address] [phone]         Tari Pillow [place], PA-C Follow up on 01/05/2024.   Specialty: General Surgery Why: Please arrive 15 minutes prior to your appointment at 9am with MYRTIS Barban on behalf of Dr. Stevie Pass information: [address] [facility] [place] [address] [phone]         Food Resources Follow up.   Why: Food Pantry by: [employer] ([facility] Next Steps:   Go to the nearest location to get services.   Call [phone] to get services.  About: The [employer] Food Pantry provides emergency groceries to community members struggling to access nutritious and adequate amounts of food necessary for a healthy diet.  This program provides:  - Food to meet basic nutritional needs  Eligibility: This program is available to anyone in need.  Nearest location: 5.34 miles away. [facility] [address] [place], [place] [address]  [phone] Hours: Monday:09:00 AM - 03:30 PM Tuesday:09:00 AM - 03:30 PM Wednesday:09:00 AM - 03:30 PM Thursday:09:00 AM - 03:30 PM Friday:09:00 AM - 03:30 PM               Discharge Diagnoses:  Principal Problem:   Morbid (severe) obesity due to excess calories [facility])   Surgical Procedure: Roux-en-Y gastric bypass, upper endoscopy  Discharge Condition: Good Disposition: Home  Diet  recommendation: Postoperative sleeve gastrectomy diet (liquids only)  Filed Weights   11/14/23 1146 11/16/23 1534  Weight: 116.4 kg 116 kg     Hospital Course:  The patient was admitted after undergoing Roux-en-Y gastric bypass. POD 0 she ambulated well. POD 1 she was started on the water  diet protocol and tolerated 300 ml in the first shift. She developed nausea and hiccups. POD 2 she was found to have an obstructing ventral hernia and was taken to OR for repair. POD 3 she was tolerating fluids well and had return of bowel function. Once meeting the water  amount she was advanced to bariatric protein shakes which they tolerated and were discharged home POD 3.  Treatments: surgery: Roux-en-Y gastric bypass  Discharge Instructions  Discharge Instructions     Ambulate hourly while awake   Complete by: As directed    Call MD for:  difficulty breathing, headache or visual disturbances   Complete by: As directed    Call MD for:  persistant dizziness or light-headedness   Complete by: As directed    Call MD for:  persistant nausea and vomiting   Complete by: As directed    Call MD for:  redness, tenderness, or signs of infection (pain, swelling, redness, odor or green/yellow discharge around incision site)   Complete by: As directed    Call MD for:  severe uncontrolled pain   Complete by: As directed    Call MD for:  temperature >101 F   Complete by: As directed    Diet bariatric full liquid   Complete by: As directed  Discharge wound care:   Complete by: As directed    Remove Bandaids tomorrow, ok to shower tomorrow. Steristrips may fall off in 1-3 weeks.   Incentive spirometry   Complete by: As directed    Perform hourly while awake      Allergies as of 11/17/2023       Reactions   Codeine Nausea And Vomiting   Hydrocodone Nausea And Vomiting   Clindamycin Hives   Clindamycin Hcl Hives   Clindamycin/lincomycin Rash        Medication List     STOP taking these  medications    minoxidil  2.5 MG tablet Commonly known as: LONITEN    pantoprazole  40 MG tablet Commonly known as: PROTONIX        TAKE these medications    acetaminophen  500 MG tablet Commonly known as: TYLENOL  Take 2 tablets (1,000 mg total) by mouth every 8 (eight) hours for 5 days.   famotidine  40 MG tablet Commonly known as: PEPCID  Take 40 mg by mouth at bedtime.   fluticasone -salmeterol 250-50 MCG/ACT Aepb Commonly known as: ADVAIR Inhale 1 puff into the lungs 2 (two) times daily as needed (shortness of breath).   gabapentin  100 MG capsule Commonly known as: NEURONTIN  Take 1 capsule (100 mg total) by mouth every 12 (twelve) hours for 5 days.   levothyroxine  150 MCG tablet Commonly known as: SYNTHROID  Take 150 mcg by mouth daily before breakfast.   ondansetron  4 MG disintegrating tablet Commonly known as: ZOFRAN -ODT Take 1 tablet (4 mg total) by mouth every 6 (six) hours as needed for nausea or vomiting.   Ventolin  HFA 108 (90 Base) MCG/ACT inhaler Generic drug: albuterol  INHALE 1-2 PUFFS BY MOUTH EVERY 6 HOURS AS NEEDED FOR WHEEZE OR SHORTNESS OF BREATH               Discharge Care Instructions  (From admission, onward)           Start     Ordered   11/15/23 0000  Discharge wound care:       Comments: Remove Bandaids tomorrow, ok to shower tomorrow. Steristrips may fall off in 1-3 weeks.   11/15/23 0749            Follow-up Information     Marilynn Ekstein, Herlene Righter, MD Follow up on 12/08/2023.   Specialty: General Surgery Why: Please arrive 15 minutes prior to your appointment at 3:30pm Contact information: [address] [place] [address] [phone]         Tari Pillow [place], PA-C Follow up on 01/05/2024.   Specialty: General Surgery Why: Please arrive 15 minutes prior to your appointment at 9am with MYRTIS Barban on behalf of Dr. Stevie Pass information: [address] [facility] [place]  [facility] [place] [address] [phone]         Food Resources Follow up.   Why: Food Pantry by: [employer] [facility]) Next Steps:   Go to the nearest location to get services.   Call [phone] to get services.  About: The [employer] provides emergency groceries to community members struggling to access nutritious and adequate amounts of food necessary for a healthy diet.  This program provides:  - Food to meet basic nutritional needs  Eligibility: This program is available to anyone in need.  Nearest location: 5.34 miles away. [facility] [address] [place], [place] [address]  [phone] Hours: Monday:09:00 AM - 03:30 PM Tuesday:09:00 AM - 03:30 PM Wednesday:09:00 AM -  03:30 PM Thursday:09:00 AM - 03:30 PM Friday:09:00 AM - 03:30 PM                 The results of significant diagnostics from this hospitalization (including imaging, microbiology, ancillary and laboratory) are listed below for reference.    Significant Diagnostic Studies: CT ABDOMEN PELVIS W CONTRAST Result Date: 11/16/2023 CLINICAL DATA:  Concern for bowel obstruction. EXAM: CT ABDOMEN AND PELVIS WITH CONTRAST TECHNIQUE: Multidetector CT imaging of the abdomen and pelvis was performed using the standard protocol following bolus administration of intravenous contrast. RADIATION DOSE REDUCTION: This exam was performed according to the departmental dose-optimization program which includes automated exposure control, adjustment of the mA and/or kV according to patient size and/or use of iterative reconstruction technique. CONTRAST:  OMNIPAQUE  IOHEXOL  300 MG/ML  SOLN COMPARISON:  CT abdomen pelvis dated 06/06/2023. FINDINGS: Lower chest: Bibasilar linear and subsegmental atelectasis. There is eventration of the left hemidiaphragm. No intra-abdominal free air.  Small free fluid in the pelvis. Hepatobiliary: Apparent fatty liver. No biliary dilatation. The  gallbladder is unremarkable. Pancreas: Unremarkable. No pancreatic ductal dilatation or surrounding inflammatory changes. Spleen: Normal in size without focal abnormality. Adrenals/Urinary Tract: The adrenal glands unremarkable. There is no hydronephrosis on either side. There is symmetric enhancement and excretion of contrast by both kidneys. The visualized ureters and urinary bladder appear unremarkable. Stomach/Bowel: There is postsurgical changes of gastric bypass. There is a small umbilical hernia containing a short segment of small bowel. There is pinching of the herniated bowel at the neck of the hernia with dilatation of the herniated bowel segment and loops of small bowel proximal to the hernia measuring up to approximately 4 cm consistent with small-bowel obstruction. The appendix is unremarkable. Vascular/Lymphatic: The abdominal aorta and IVC unremarkable. No portal venous gas. There is no adenopathy. Reproductive: Hysterectomy.  No suspicious adnexal masses. Other: Subcutaneous edema of the anterior abdominal wall. Musculoskeletal: No acute osseous pathology. IMPRESSION: 1. Small-bowel obstruction secondary to a small umbilical hernia. Surgical consult advised. 2. Postsurgical changes of gastric bypass. 3. Fatty liver. Electronically Signed   By: Vanetta Chou M.D.   On: 11/16/2023 16:24    Labs: Basic Metabolic Panel: Recent Labs  Lab 11/14/23 1132  NA 139  K 3.9  CL 107  CO2 22  GLUCOSE 92  BUN 15  CREATININE 0.78  CALCIUM 9.2   Liver Function Tests: Recent Labs  Lab 11/14/23 1132  AST 18  ALT 15  ALKPHOS 71  BILITOT 0.6  PROT 7.6  ALBUMIN 4.0    CBC: Recent Labs  Lab 11/14/23 1930 11/15/23 0438 11/16/23 0450  WBC 17.7* 15.3* 15.8*  NEUTROABS  --  13.7* 13.2*  HGB 11.8* 11.7* 11.8*  HCT 38.8 38.7 38.1  MCV 79.5* 79.6* 77.9*  PLT 295 318 318    CBG: No results for input(s): GLUCAP in the last 168 hours.  Principal Problem:   Morbid (severe) obesity  due to excess calories [facility])   VTE plan: I will prescribe outpatient chemical prophylaxis of enoxaparin  due to this increased risk ([email or web address])  Time coordinating discharge: 15 min

## 2023-11-17 NOTE — Progress Notes (Signed)
 Patient sitting upright in bed, alert and oriented. Patient eager about going home; tolerating fluids much better than prior. Post-op education has been reviewed with patient on 11/15/23; asked patient and son if they had any additional questions or concerns - patient was given time to review and address questions asked.  Reviewed lovenox  education with patient and patient was able to demonstrate understanding by verbal and physical demonstration of injection - see EMAR. No further questions following the encounter.    Thank you,  Roseann Medley, RN, MSN Bariatric Nurse Coordinator [phone] (office)

## 2023-11-24 ENCOUNTER — Telehealth (HOSPITAL_COMMUNITY): Payer: Self-pay | Admitting: *Deleted

## 2023-11-24 NOTE — Telephone Encounter (Signed)
 Post-op follow-up , voicemail left with callback number

## 2023-11-24 NOTE — Telephone Encounter (Signed)
 Was on the line with another patient at the time.  Patient returned phone call and shared that she is doing really well and excited to advance her diet next week after her post-op nutrition class.   Returned Patient Phone Call  1. Tell me about your pain and pain management?    Pt denies any pain.   2. Let's talk about fluid intake. How much total fluid are you taking in?   Pt states that s/he is working to meet goal of 64 oz of fluid today. Pt has been able to consume approx. 64oz of fluid per day since surgery. Pt plans to increase clear liquids to meet fluid goals.    3. How much protein have you taken in the last day?     Pt states she is meeting the goal of 60g of protein each day with the protein shakes and greek yogurt.   4. Have you had nausea? Tell me about when you have experienced nausea and what you did to help?   Pt denies nausea.   5. Has the frequency or color changed with your urine?   Pt states that s/he is urinating fine with no changes in frequency or urgency.   6. Tell me what your incisions look like?   Incisions look fine. Pt denies a fever, chills. Pt states incisions are not swollen, open, or draining. Pt encouraged to call [facility] if incisions change.    7. Have you been passing gas? BM?   Pt states that they are having BM  Pt states that they have had a BM. Pt instructed to take either Miralax  or MoM as instructed per [facility] Discharge Home Care Instructions. Pt to call surgeon's office if not able to have BM with medication.      8. If a problem or question were to arise who would you call? Do you know contact numbers for [facility], [facility], and [facility]?   Pt knows to call [facility] for surgical, [facility] for nutrition, and [facility] for non-urgent questions or concerns. Pt denies dehydration symptoms. Pt can describe s/sx of dehydration.   9. How has the walking going?   Pt states s/he is walking around and able to be active without difficulty.   10. How  has your breathing been going?  Pt did not report any issues with breathing.  11. How are your vitamins and calcium going? How are you taking them?    Pt states that s/he is taking his/her supplements and vitamins without difficulty.   12. How has the anticoagulant Lovenox  been going?   LOVENOX : Pt states that s/he is taking the Lovenox  injections without difficulty. Reinforced education about taking injections q12h and rotating injection sites. Pt also instructed to monitor for unusual bruising and/or signs of bleeding.

## 2023-11-29 ENCOUNTER — Encounter: Payer: Self-pay | Admitting: Dietician

## 2023-11-29 ENCOUNTER — Encounter: Attending: General Surgery | Admitting: Dietician

## 2023-11-29 VITALS — Ht 64.0 in | Wt 249.3 lb

## 2023-11-29 DIAGNOSIS — E669 Obesity, unspecified: Secondary | ICD-10-CM | POA: Diagnosis present

## 2023-11-29 NOTE — Anesthesia Postprocedure Evaluation (Signed)
 Anesthesia Post Note  Patient: Linda Ware  Procedure(s) Performed: REPAIR, HERNIA, VENTRAL, LAPAROSCOPIC     Patient location during evaluation: PACU Anesthesia Type: General Level of consciousness: awake Pain management: pain level controlled Vital Signs Assessment: post-procedure vital signs reviewed and stable Respiratory status: spontaneous breathing, nonlabored ventilation and respiratory function stable Cardiovascular status: blood pressure returned to baseline and stable Postop Assessment: no apparent nausea or vomiting Anesthetic complications: no   No notable events documented.  Last Vitals:  Vitals:   11/17/23 0801 11/17/23 0943  BP:  129/77  Pulse: 62 63  Resp: 20 17  Temp:  36.5 C  SpO2: 98% 100%    Last Pain:  Vitals:   11/17/23 0943  TempSrc: Oral  PainSc:                  Linda Ware

## 2023-11-29 NOTE — Progress Notes (Signed)
 2 Week Post-Operative Nutrition Class   Patient was seen on 11/29/2023 for Post-Operative Nutrition education at the [facility].    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 No Bowel Habits: Every day to every other day no complaints   Body Composition Scale 11/29/2023  Current Body Weight 249.3  Total Body Fat % 45.9  Visceral Fat 17  Fat-Free Mass % 54.0   Total Body Water  % 41.5  Muscle-Mass lbs 31.0  BMI 42.7  Body Fat Displacement          Torso  lbs 70.9         Left Leg  lbs 14.1         Right Leg  lbs 14.1         Left Arm  lbs 7.0         Right Arm  lbs 7.0    The following the learning objectives were met by the patient during this course: Identifies Soft Prepped Plan Advancement Guide  Identifies Soft, High Proteins (Phase 1), beginning 2 weeks post-operatively to 3 weeks post-operatively Identifies Additional Soft High Proteins, soft non-starchy vegetables, fruits and starches (Phase 2), beginning 3 weeks post-operatively to 3 months post-operatively Identifies appropriate sources of fluids, proteins, vegetables, fruits and starches Identifies appropriate fat sources and healthy verses unhealthy fat types   States protein, vegetable, fruit and starch recommendations and appropriate sources post-operatively Identifies the need for appropriate texture modifications, mastication, and bite sizes when consuming solids Identifies appropriate fat consumption and sources Identifies appropriate multivitamin and calcium sources  post-operatively Describes the need for physical activity post-operatively and will follow MD recommendations States when to call healthcare provider regarding medication questions or post-operative complications   Handouts given during class include: Soft Prepped Plan Advancement Guide   Follow-Up Plan: Patient will follow-up at [facility] in 10 weeks for 3 month post-op nutrition visit for diet advancement per MD.

## 2023-12-08 ENCOUNTER — Telehealth: Payer: Self-pay | Admitting: Dietician

## 2023-12-08 DIAGNOSIS — E669 Obesity, unspecified: Secondary | ICD-10-CM

## 2023-12-08 NOTE — Telephone Encounter (Signed)
 RD called pt to verify fluid intake once starting soft, solid proteins 2 week post-bariatric surgery.   Daily Fluid intake: 64 oz Daily Protein intake: 60+ grams Bowel Habits: constipation  Concerns/issues: constipation... currently 5 days without a bowel; Miralax  (Clear lax), Senokot tablets. Dietitian recommended continue fluid intake, and include non-starchy cooked vegetables and complex carbohydrates before fullness after protein intake. Pt states she is seeing the surgeon today.

## 2023-12-08 NOTE — Telephone Encounter (Signed)
 RD called pt to verify fluid intake once starting soft, solid proteins 2 week post-bariatric surgery.   Daily Fluid intake:  Daily Protein intake:  Bowel Habits:   Concerns/issues:   Left Voice Message with call back number

## 2024-02-14 ENCOUNTER — Encounter: Payer: Self-pay | Admitting: Dietician

## 2024-02-14 ENCOUNTER — Encounter: Attending: General Surgery | Admitting: Dietician

## 2024-02-14 VITALS — Ht 64.0 in | Wt 218.8 lb

## 2024-02-14 DIAGNOSIS — E669 Obesity, unspecified: Secondary | ICD-10-CM | POA: Diagnosis present

## 2024-02-14 NOTE — Progress Notes (Signed)
 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 No Bowel Habits: Every day to every other day no complaints   Body Composition Scale 11/29/2023 02/14/2024  Current Body Weight 249.3 218.8  Total Body Fat % 45.9 42.6  Visceral Fat 17 14  Fat-Free Mass % 54.0 57.3   Total Body Water  % 41.5 43.1  Muscle-Mass lbs 31.0 30.7  BMI 42.7 37.4  Body Fat Displacement           Torso  lbs 70.9 57.8         Left Leg  lbs 14.1 11.5         Right Leg  lbs 14.1 11.5         Left Arm  lbs 7.0 5.7         Right Arm  lbs 7.0 5.7     Lifestyle & Dietary Hx Pt states she eats protein with every meal or snack, stating she is not tracking her protein. Pt states she is eating every 3-4 hours. Pt states sometimes she feels swollen and can't get meat down, stating she will drink a protein shake.  Estimated daily fluid intake: 50 oz Estimated daily protein intake: ?? G (eating protein with every meal or snack) Supplements: multivitamin (no iron ) and calcium, magnesium, separate iron  supplement Current average weekly physical activity: walking 2 days per week, other exercises (weight lifting, cardio with rebounding) 5 days per week for 10 minutes  24-Hr Dietary Recall First Meal: high protein greek yogurt with frozen berries Snack: ground beef or chicken with ranch  Second Meal: cheese with protein or fish or protein shake Snack: edamame  Third Meal: meat with vegetables Snack:  yogurt Beverages: water , coffee  Post-Op Goals/ Signs/ Symptoms Using straws: yes Drinking while eating: no Chewing/swallowing difficulties: no Changes in vision: no Changes to mood/headaches: no Hair loss/changes to skin/nails: no Difficulty focusing/concentrating: no Sweating: no Limb weakness: no Dizziness/lightheadedness: no Palpitations: no  Carbonated/caffeinated beverages: no N/V/D/C/Gas: takes a chewable probiotic; Miralax /clearlax as needed Abdominal pain: no Dumping syndrome: no   NUTRITION DIAGNOSIS  Overweight/obesity ([place]-3.3) related to past poor dietary habits and physical inactivity as evidenced by completed bariatric surgery and following dietary guidelines for continued weight loss and healthy nutrition status.   NUTRITION INTERVENTION Nutrition counseling (C-1) and education (E-2) to facilitate bariatric surgery goals, including: Diet advancement to the standard prep plan The importance of consuming adequate calories as well as certain nutrients daily due to the body's need for essential vitamins, minerals, and fats The importance of daily physical activity and to reach a goal of at least 150 minutes of moderate to vigorous physical activity weekly (or as directed by their physician) due to benefits such as increased musculature and improved lab values The importance of intuitive eating specifically learning hunger-satiety cues and understanding the importance of learning a new body: The importance of mindful eating to avoid grazing behaviors   Goals Increase physical activity; join BELT; increase duration for home workouts, aiming for 20-30 minutes  Handouts Provided Include  Standard Prep Plan Advancement Guide  Learning Style & Readiness for Change Teaching method utilized: Visual & Auditory  Demonstrated degree of understanding via: Teach Back  Readiness Level: ready Barriers to learning/adherence to lifestyle change: nothing identified  RD's Notes for  Next Visit Assess adherence to pt chosen goals  MONITORING & EVALUATION Dietary intake, weekly physical activity, body weight.  Next Steps Patient is to follow-up in 3 months for 6 month post-op follow-up.

## 2024-03-20 ENCOUNTER — Ambulatory Visit: Admitting: Dermatology

## 2024-05-22 ENCOUNTER — Encounter: Attending: General Surgery | Admitting: Skilled Nursing Facility1

## 2024-05-22 VITALS — Wt 193.3 lb

## 2024-05-22 DIAGNOSIS — E669 Obesity, unspecified: Secondary | ICD-10-CM | POA: Diagnosis present

## 2024-05-23 ENCOUNTER — Encounter: Payer: Self-pay | Admitting: Skilled Nursing Facility1

## 2024-05-23 NOTE — Progress Notes (Signed)
 Follow-up visit:  Post-Operative RYGB Surgery  Medical Nutrition Therapy:  Appt start time: 5:33 end time:  6:15  This was a class of 4 patients.   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 No Bowel Habits: Every day to every other day no complaints   Body Composition Scale 11/29/2023 02/14/2024 05/23/2023  Current Body Weight 249.3 218.8 193.3  Total Body Fat % 45.9 42.6 39.2  Visceral Fat 17 14 11   Fat-Free Mass % 54.0 57.3 60.7   Total Body Water  % 41.5 43.1 44.8  Muscle-Mass lbs 31.0 30.7 30.3  BMI 42.7 37.4 33  Body Fat Displacement            Torso  lbs 70.9 57.8 46.8         Left Leg  lbs 14.1 11.5 9.3         Right Leg  lbs 14.1 11.5 9.3         Left Arm  lbs 7.0 5.7 4.6         Right Arm  lbs 7.0 5.7 4.6     Information Reviewed/ Discussed During Appointment: -Review of composition scale numbers -Fluid requirements (64-100 ounces) -Protein requirements (60-80g) -Strategies for tolerating diet -Advancement of diet to include Starchy vegetables -Barriers to inclusion of new foods -Inclusion of appropriate multivitamin and calcium supplements  -Exercise recommendations   Fluid intake: adequate   Medications: See List Supplementation: appropriate    Using straws: no Drinking while eating: no Having you been chewing well: yes Chewing/swallowing difficulties: no Changes in vision: no Changes to mood/headaches: no Hair loss/[place] to  skin/Changes to nails: no Any difficulty focusing or concentrating: no Sweating: no Dizziness/Lightheaded: no Palpitations: no  Carbonated beverages: no N/V/D/C/GAS: no Abdominal Pain: no Dumping syndrome: no  Recent physical activity:  ADL's  Progress Towards Goal(s):  In Progress Teaching method utilized: Visual & Auditory  Demonstrated degree of understanding via: Teach Back  Readiness Level: Action Barriers to learning/adherence to lifestyle change: none identified  Handouts given during visit include: Phase V diet Progression  Goals Sheet The Benefits of Exercise are endless..... Support Group Topics   Teaching Method Utilized:  Visual Auditory Hands on  Demonstrated degree of understanding via:  Teach Back   Monitoring/Evaluation:  Dietary intake, exercise, and body weight. Follow up in 3 months for 9 month post-op visit.

## 2024-08-09 ENCOUNTER — Ambulatory Visit: Payer: Medicaid Other | Admitting: Dermatology

## 2024-08-20 ENCOUNTER — Ambulatory Visit: Admitting: Dermatology

## 2024-08-20 ENCOUNTER — Encounter: Admitting: Dietician

## 2024-08-23 ENCOUNTER — Ambulatory Visit: Admitting: Dermatology

## 2024-08-27 ENCOUNTER — Ambulatory Visit: Admitting: Dermatology
